# Patient Record
Sex: Male | Born: 1985 | Race: Black or African American | Hispanic: No | Marital: Single | Smoking: Current every day smoker
Health system: Southern US, Community
[De-identification: ages and names within clinical notes are randomized; demographics above are authoritative.]

## PROBLEM LIST (undated history)

## (undated) DIAGNOSIS — B1920 Unspecified viral hepatitis C without hepatic coma: Secondary | ICD-10-CM

## (undated) DIAGNOSIS — K859 Acute pancreatitis without necrosis or infection, unspecified: Secondary | ICD-10-CM

## (undated) DIAGNOSIS — K2970 Gastritis, unspecified, without bleeding: Secondary | ICD-10-CM

---

## 2004-03-18 ENCOUNTER — Emergency Department: Payer: Self-pay | Admitting: Emergency Medicine

## 2007-12-18 ENCOUNTER — Emergency Department: Payer: Self-pay | Admitting: Internal Medicine

## 2008-12-27 ENCOUNTER — Emergency Department: Payer: Self-pay | Admitting: Emergency Medicine

## 2009-02-18 ENCOUNTER — Emergency Department: Payer: Self-pay | Admitting: Emergency Medicine

## 2009-03-11 ENCOUNTER — Emergency Department: Payer: Self-pay | Admitting: Emergency Medicine

## 2009-03-12 ENCOUNTER — Emergency Department: Payer: Self-pay | Admitting: Emergency Medicine

## 2009-03-13 ENCOUNTER — Inpatient Hospital Stay: Payer: Self-pay | Admitting: Internal Medicine

## 2009-03-18 ENCOUNTER — Emergency Department: Payer: Self-pay | Admitting: Emergency Medicine

## 2009-04-23 ENCOUNTER — Inpatient Hospital Stay: Payer: Self-pay | Admitting: Internal Medicine

## 2009-07-03 ENCOUNTER — Emergency Department: Payer: Self-pay | Admitting: Internal Medicine

## 2009-07-03 ENCOUNTER — Emergency Department: Payer: Self-pay | Admitting: Emergency Medicine

## 2009-07-05 ENCOUNTER — Emergency Department: Payer: Self-pay | Admitting: Unknown Physician Specialty

## 2009-07-09 ENCOUNTER — Emergency Department: Payer: Self-pay | Admitting: Unknown Physician Specialty

## 2009-11-27 ENCOUNTER — Emergency Department: Payer: Self-pay | Admitting: Internal Medicine

## 2009-11-29 ENCOUNTER — Emergency Department: Payer: Self-pay | Admitting: Emergency Medicine

## 2010-03-02 ENCOUNTER — Emergency Department: Payer: Self-pay | Admitting: Emergency Medicine

## 2010-03-12 ENCOUNTER — Observation Stay: Payer: Self-pay | Admitting: Internal Medicine

## 2010-03-27 ENCOUNTER — Emergency Department: Payer: Self-pay | Admitting: Unknown Physician Specialty

## 2010-04-03 ENCOUNTER — Observation Stay: Payer: Self-pay | Admitting: Internal Medicine

## 2010-06-25 ENCOUNTER — Observation Stay: Payer: Self-pay | Admitting: Internal Medicine

## 2010-06-26 ENCOUNTER — Emergency Department: Payer: Self-pay | Admitting: Emergency Medicine

## 2010-09-06 ENCOUNTER — Emergency Department: Payer: Self-pay | Admitting: Emergency Medicine

## 2010-10-05 ENCOUNTER — Emergency Department: Payer: Self-pay | Admitting: Emergency Medicine

## 2010-11-26 ENCOUNTER — Emergency Department: Payer: Self-pay | Admitting: *Deleted

## 2010-12-11 ENCOUNTER — Emergency Department: Payer: Self-pay | Admitting: Internal Medicine

## 2011-02-14 ENCOUNTER — Inpatient Hospital Stay: Payer: Self-pay | Admitting: Specialist

## 2011-02-25 ENCOUNTER — Emergency Department: Payer: Self-pay | Admitting: Emergency Medicine

## 2011-02-26 ENCOUNTER — Emergency Department: Payer: Self-pay | Admitting: Internal Medicine

## 2011-03-24 ENCOUNTER — Emergency Department: Payer: Self-pay | Admitting: Emergency Medicine

## 2011-03-25 ENCOUNTER — Inpatient Hospital Stay: Payer: Self-pay | Admitting: Internal Medicine

## 2011-03-29 ENCOUNTER — Emergency Department: Payer: Self-pay | Admitting: Emergency Medicine

## 2011-03-31 ENCOUNTER — Emergency Department: Payer: Self-pay | Admitting: *Deleted

## 2011-05-08 ENCOUNTER — Emergency Department: Payer: Self-pay | Admitting: Emergency Medicine

## 2011-06-02 ENCOUNTER — Emergency Department: Payer: Self-pay | Admitting: Emergency Medicine

## 2011-06-02 LAB — AMYLASE: Amylase: 61 U/L (ref 25–115)

## 2011-06-02 LAB — COMPREHENSIVE METABOLIC PANEL
Anion Gap: 11 (ref 7–16)
BUN: 23 mg/dL — ABNORMAL HIGH (ref 7–18)
Bilirubin,Total: 0.8 mg/dL (ref 0.2–1.0)
Chloride: 102 mmol/L (ref 98–107)
Co2: 26 mmol/L (ref 21–32)
Creatinine: 1.4 mg/dL — ABNORMAL HIGH (ref 0.60–1.30)
EGFR (African American): >60
EGFR (Non-African Amer.): >60
Osmolality: 283 (ref 275–301)
Potassium: 3.8 mmol/L (ref 3.5–5.1)
SGPT (ALT): 37 U/L
Total Protein: 9.3 g/dL — ABNORMAL HIGH (ref 6.4–8.2)

## 2011-06-02 LAB — CBC
HCT: 44.5 % (ref 40.0–52.0)
HGB: 15.2 g/dL (ref 13.0–18.0)
MCHC: 34.2 g/dL (ref 32.0–36.0)
Platelet: 187 10*3/uL (ref 150–440)
RDW: 13.4 % (ref 11.5–14.5)
WBC: 21.8 10*3/uL — ABNORMAL HIGH (ref 3.8–10.6)

## 2011-06-03 ENCOUNTER — Emergency Department: Payer: Self-pay | Admitting: *Deleted

## 2011-06-03 LAB — CBC
HCT: 41.7 % (ref 40.0–52.0)
HGB: 13.8 g/dL (ref 13.0–18.0)
MCH: 31 pg (ref 26.0–34.0)
MCV: 94 fL (ref 80–100)
Platelet: 162 10*3/uL (ref 150–440)
RBC: 4.45 10*6/uL (ref 4.40–5.90)
WBC: 9.8 10*3/uL (ref 3.8–10.6)

## 2011-06-03 LAB — COMPREHENSIVE METABOLIC PANEL
Alkaline Phosphatase: 53 U/L (ref 50–136)
Anion Gap: 9 (ref 7–16)
BUN: 14 mg/dL (ref 7–18)
Calcium, Total: 9.5 mg/dL (ref 8.5–10.1)
Chloride: 105 mmol/L (ref 98–107)
Creatinine: 1.19 mg/dL (ref 0.60–1.30)
EGFR (African American): >60
Osmolality: 282 (ref 275–301)
Potassium: 3.7 mmol/L (ref 3.5–5.1)
SGOT(AST): 22 U/L (ref 15–37)
Total Protein: 7.8 g/dL (ref 6.4–8.2)

## 2011-06-03 LAB — ETHANOL: Ethanol: <3 mg/dL

## 2011-06-05 ENCOUNTER — Emergency Department: Payer: Self-pay | Admitting: Emergency Medicine

## 2011-06-05 LAB — COMPREHENSIVE METABOLIC PANEL
Albumin: 3.8 g/dL (ref 3.4–5.0)
Anion Gap: 12 (ref 7–16)
BUN: 13 mg/dL (ref 7–18)
Bilirubin,Total: 0.2 mg/dL (ref 0.2–1.0)
Chloride: 108 mmol/L — ABNORMAL HIGH (ref 98–107)
Co2: 24 mmol/L (ref 21–32)
Creatinine: 0.95 mg/dL (ref 0.60–1.30)
EGFR (African American): >60
Potassium: 3.6 mmol/L (ref 3.5–5.1)
Sodium: 144 mmol/L (ref 136–145)
Total Protein: 7.7 g/dL (ref 6.4–8.2)

## 2011-06-05 LAB — CBC WITH DIFFERENTIAL/PLATELET
Basophil %: 0.3 %
Eosinophil %: 2.1 %
HCT: 41 % (ref 40.0–52.0)
HGB: 13.4 g/dL (ref 13.0–18.0)
Lymphocyte #: 2 10*3/uL (ref 1.0–3.6)
Lymphocyte %: 13.4 %
MCHC: 32.8 g/dL (ref 32.0–36.0)
MCV: 94 fL (ref 80–100)
Monocyte %: 8.2 %
Neutrophil %: 76 %
RBC: 4.34 10*6/uL — ABNORMAL LOW (ref 4.40–5.90)
WBC: 14.7 10*3/uL — ABNORMAL HIGH (ref 3.8–10.6)

## 2011-07-04 ENCOUNTER — Emergency Department: Payer: Self-pay | Admitting: Emergency Medicine

## 2011-07-04 LAB — URINALYSIS, COMPLETE
Blood: NEGATIVE
Glucose,UR: NEGATIVE mg/dL (ref 0–75)
Ketone: NEGATIVE
Nitrite: NEGATIVE
Ph: 9 (ref 4.5–8.0)
Specific Gravity: 1.02 (ref 1.003–1.030)

## 2011-07-04 LAB — COMPREHENSIVE METABOLIC PANEL
Albumin: 4.7 g/dL (ref 3.4–5.0)
Calcium, Total: 9.4 mg/dL (ref 8.5–10.1)
Co2: 26 mmol/L (ref 21–32)
EGFR (African American): >60
EGFR (Non-African Amer.): >60
Glucose: 109 mg/dL — ABNORMAL HIGH (ref 65–99)
Osmolality: 283 (ref 275–301)
Potassium: 3.8 mmol/L (ref 3.5–5.1)
SGOT(AST): 30 U/L (ref 15–37)
SGPT (ALT): 29 U/L
Sodium: 141 mmol/L (ref 136–145)
Total Protein: 8.9 g/dL — ABNORMAL HIGH (ref 6.4–8.2)

## 2011-07-04 LAB — CBC
MCHC: 33.8 g/dL (ref 32.0–36.0)
MCV: 93 fL (ref 80–100)
RBC: 4.82 10*6/uL (ref 4.40–5.90)
RDW: 13.2 % (ref 11.5–14.5)
WBC: 12.7 10*3/uL — ABNORMAL HIGH (ref 3.8–10.6)

## 2011-07-04 LAB — LIPASE, BLOOD: Lipase: 266 U/L (ref 73–393)

## 2011-07-05 ENCOUNTER — Emergency Department: Payer: Self-pay | Admitting: Emergency Medicine

## 2011-07-05 LAB — COMPREHENSIVE METABOLIC PANEL
Anion Gap: 14 (ref 7–16)
Bilirubin,Total: 0.7 mg/dL (ref 0.2–1.0)
Chloride: 99 mmol/L (ref 98–107)
Co2: 30 mmol/L (ref 21–32)
Creatinine: 1.2 mg/dL (ref 0.60–1.30)
EGFR (African American): >60
EGFR (Non-African Amer.): >60
Glucose: 129 mg/dL — ABNORMAL HIGH (ref 65–99)
Osmolality: 288 (ref 275–301)
Potassium: 4 mmol/L (ref 3.5–5.1)
SGPT (ALT): 28 U/L
Sodium: 143 mmol/L (ref 136–145)
Total Protein: 9.3 g/dL — ABNORMAL HIGH (ref 6.4–8.2)

## 2011-07-05 LAB — LIPASE, BLOOD: Lipase: 52 U/L — ABNORMAL LOW (ref 73–393)

## 2011-07-05 LAB — CBC
MCHC: 33.5 g/dL (ref 32.0–36.0)
MCV: 94 fL (ref 80–100)
Platelet: 189 10*3/uL (ref 150–440)
RBC: 5.04 10*6/uL (ref 4.40–5.90)

## 2011-07-06 ENCOUNTER — Emergency Department: Payer: Self-pay | Admitting: Emergency Medicine

## 2011-07-06 ENCOUNTER — Inpatient Hospital Stay: Payer: Self-pay | Admitting: Internal Medicine

## 2011-07-06 LAB — COMPREHENSIVE METABOLIC PANEL
Albumin: 4 g/dL (ref 3.4–5.0)
Alkaline Phosphatase: 60 U/L (ref 50–136)
Bilirubin,Total: 0.3 mg/dL (ref 0.2–1.0)
Calcium, Total: 9 mg/dL (ref 8.5–10.1)
EGFR (African American): >60
EGFR (Non-African Amer.): >60
Glucose: 110 mg/dL — ABNORMAL HIGH (ref 65–99)
Potassium: 3.5 mmol/L (ref 3.5–5.1)
Sodium: 139 mmol/L (ref 136–145)

## 2011-07-06 LAB — CBC
HCT: 42.3 % (ref 40.0–52.0)
HGB: 14.1 g/dL (ref 13.0–18.0)
MCHC: 33.3 g/dL (ref 32.0–36.0)
Platelet: 162 10*3/uL (ref 150–440)
RBC: 4.5 10*6/uL (ref 4.40–5.90)
WBC: 12.7 10*3/uL — ABNORMAL HIGH (ref 3.8–10.6)

## 2011-07-06 LAB — URINALYSIS, COMPLETE
Bacteria: NONE SEEN
Glucose,UR: NEGATIVE mg/dL (ref 0–75)
Leukocyte Esterase: NEGATIVE
Nitrite: NEGATIVE
Protein: NEGATIVE
RBC,UR: 2 /HPF (ref 0–5)
Squamous Epithelial: 1
WBC UR: 2 /HPF (ref 0–5)

## 2011-07-06 LAB — LIPASE, BLOOD: Lipase: 874 U/L — ABNORMAL HIGH (ref 73–393)

## 2011-07-07 LAB — LIPASE, BLOOD: Lipase: 207 U/L (ref 73–393)

## 2011-07-07 LAB — CBC WITH DIFFERENTIAL/PLATELET
Basophil %: 0.5 %
Eosinophil #: 0.4 10*3/uL (ref 0.0–0.7)
Eosinophil %: 3.3 %
HGB: 11.8 g/dL — ABNORMAL LOW (ref 13.0–18.0)
Lymphocyte #: 4.1 10*3/uL — ABNORMAL HIGH (ref 1.0–3.6)
Lymphocyte %: 35.8 %
MCH: 31.2 pg (ref 26.0–34.0)
MCV: 95 fL (ref 80–100)
Monocyte #: 1.1 10*3/uL — ABNORMAL HIGH (ref 0.0–0.7)
Neutrophil %: 51.2 %
Platelet: 135 10*3/uL — ABNORMAL LOW (ref 150–440)
WBC: 11.5 10*3/uL — ABNORMAL HIGH (ref 3.8–10.6)

## 2011-07-07 LAB — COMPREHENSIVE METABOLIC PANEL
BUN: 10 mg/dL (ref 7–18)
Bilirubin,Total: 0.5 mg/dL (ref 0.2–1.0)
Calcium, Total: 8.5 mg/dL (ref 8.5–10.1)
Chloride: 104 mmol/L (ref 98–107)
Co2: 28 mmol/L (ref 21–32)
Creatinine: 1.11 mg/dL (ref 0.60–1.30)
EGFR (African American): >60
EGFR (Non-African Amer.): >60
Glucose: 87 mg/dL (ref 65–99)
Osmolality: 280 (ref 275–301)
Potassium: 3.7 mmol/L (ref 3.5–5.1)
SGOT(AST): 22 U/L (ref 15–37)
SGPT (ALT): 16 U/L
Sodium: 141 mmol/L (ref 136–145)
Total Protein: 6 g/dL — ABNORMAL LOW (ref 6.4–8.2)

## 2011-07-29 ENCOUNTER — Emergency Department: Payer: Self-pay | Admitting: Emergency Medicine

## 2011-07-29 LAB — URINALYSIS, COMPLETE
Bilirubin,UR: NEGATIVE
Leukocyte Esterase: NEGATIVE
Nitrite: NEGATIVE
Ph: 8 (ref 4.5–8.0)
RBC,UR: 3 /HPF (ref 0–5)
Squamous Epithelial: <1
WBC UR: 1 /HPF (ref 0–5)

## 2011-07-29 LAB — COMPREHENSIVE METABOLIC PANEL
Albumin: 4.4 g/dL (ref 3.4–5.0)
Anion Gap: 10 (ref 7–16)
Bilirubin,Total: 0.4 mg/dL (ref 0.2–1.0)
Calcium, Total: 9.5 mg/dL (ref 8.5–10.1)
Co2: 24 mmol/L (ref 21–32)
Creatinine: 0.98 mg/dL (ref 0.60–1.30)
EGFR (Non-African Amer.): >60
Glucose: 112 mg/dL — ABNORMAL HIGH (ref 65–99)
Osmolality: 284 (ref 275–301)
Potassium: 4 mmol/L (ref 3.5–5.1)
SGOT(AST): 26 U/L (ref 15–37)

## 2011-07-29 LAB — CBC
HGB: 14.9 g/dL (ref 13.0–18.0)
MCHC: 33.5 g/dL (ref 32.0–36.0)
Platelet: 204 10*3/uL (ref 150–440)
RDW: 13.6 % (ref 11.5–14.5)
WBC: 18 10*3/uL — ABNORMAL HIGH (ref 3.8–10.6)

## 2011-07-29 LAB — ETHANOL: Ethanol: <3 mg/dL

## 2011-07-31 ENCOUNTER — Emergency Department: Payer: Self-pay | Admitting: Emergency Medicine

## 2011-07-31 LAB — URINALYSIS, COMPLETE
Bacteria: NONE SEEN
Bilirubin,UR: NEGATIVE
Blood: NEGATIVE
Glucose,UR: NEGATIVE mg/dL (ref 0–75)
Ketone: NEGATIVE
Leukocyte Esterase: NEGATIVE
Nitrite: NEGATIVE
Ph: 6 (ref 4.5–8.0)
Protein: NEGATIVE
RBC,UR: 1 /HPF (ref 0–5)
Specific Gravity: 1.008 (ref 1.003–1.030)
Squamous Epithelial: NONE SEEN
WBC UR: NONE SEEN /HPF (ref 0–5)

## 2011-07-31 LAB — COMPREHENSIVE METABOLIC PANEL
Alkaline Phosphatase: 53 U/L (ref 50–136)
Anion Gap: 6 — ABNORMAL LOW (ref 7–16)
Bilirubin,Total: 0.4 mg/dL (ref 0.2–1.0)
Calcium, Total: 9 mg/dL (ref 8.5–10.1)
Co2: 27 mmol/L (ref 21–32)
EGFR (African American): >60
Glucose: 94 mg/dL (ref 65–99)
Osmolality: 280 (ref 275–301)
Potassium: 3.9 mmol/L (ref 3.5–5.1)
SGOT(AST): 19 U/L (ref 15–37)
Total Protein: 7.5 g/dL (ref 6.4–8.2)

## 2011-07-31 LAB — CBC
HCT: 41.5 % (ref 40.0–52.0)
HGB: 13.8 g/dL (ref 13.0–18.0)
MCH: 31.4 pg (ref 26.0–34.0)
MCHC: 33.2 g/dL (ref 32.0–36.0)
MCV: 95 fL (ref 80–100)
Platelet: 173 10*3/uL (ref 150–440)
RBC: 4.38 10*6/uL — ABNORMAL LOW (ref 4.40–5.90)
RDW: 13.4 % (ref 11.5–14.5)
WBC: 9.3 10*3/uL (ref 3.8–10.6)

## 2011-09-07 ENCOUNTER — Emergency Department: Payer: Self-pay | Admitting: Emergency Medicine

## 2011-09-07 LAB — COMPREHENSIVE METABOLIC PANEL
Albumin: 4.4 g/dL (ref 3.4–5.0)
Alkaline Phosphatase: 73 U/L (ref 50–136)
BUN: 10 mg/dL (ref 7–18)
Bilirubin,Total: 0.7 mg/dL (ref 0.2–1.0)
Chloride: 103 mmol/L (ref 98–107)
Co2: 26 mmol/L (ref 21–32)
Creatinine: 1.04 mg/dL (ref 0.60–1.30)
EGFR (African American): >60
Osmolality: 275 (ref 275–301)
Potassium: 3.7 mmol/L (ref 3.5–5.1)
SGOT(AST): 30 U/L (ref 15–37)
SGPT (ALT): 24 U/L
Sodium: 138 mmol/L (ref 136–145)
Total Protein: 8.5 g/dL — ABNORMAL HIGH (ref 6.4–8.2)

## 2011-09-07 LAB — URINALYSIS, COMPLETE
Bacteria: NONE SEEN
Bilirubin,UR: NEGATIVE
Glucose,UR: NEGATIVE mg/dL (ref 0–75)
Nitrite: NEGATIVE
Ph: 6 (ref 4.5–8.0)
RBC,UR: 2 /HPF (ref 0–5)
Specific Gravity: 1.023 (ref 1.003–1.030)

## 2011-09-07 LAB — CBC WITH DIFFERENTIAL/PLATELET
Basophil #: 0.1 10*3/uL (ref 0.0–0.1)
Eosinophil %: 0.9 %
HGB: 14.7 g/dL (ref 13.0–18.0)
Lymphocyte #: 2.2 10*3/uL (ref 1.0–3.6)
Lymphocyte %: 21.3 %
MCHC: 33 g/dL (ref 32.0–36.0)
MCV: 95 fL (ref 80–100)
Monocyte #: 0.8 x10 3/mm (ref 0.2–1.0)
Monocyte %: 7.8 %
Neutrophil #: 7.2 10*3/uL — ABNORMAL HIGH (ref 1.4–6.5)
Neutrophil %: 69.1 %
Platelet: 175 10*3/uL (ref 150–440)
RBC: 4.68 10*6/uL (ref 4.40–5.90)
RDW: 13.6 % (ref 11.5–14.5)

## 2011-09-07 LAB — LIPASE, BLOOD: Lipase: 112 U/L (ref 73–393)

## 2011-09-08 ENCOUNTER — Emergency Department: Payer: Self-pay | Admitting: *Deleted

## 2011-10-09 ENCOUNTER — Emergency Department: Payer: Self-pay | Admitting: Emergency Medicine

## 2011-10-09 LAB — CBC
MCHC: 33.3 g/dL (ref 32.0–36.0)
Platelet: 155 10*3/uL (ref 150–440)
RBC: 4.33 10*6/uL — ABNORMAL LOW (ref 4.40–5.90)
RDW: 13.3 % (ref 11.5–14.5)
WBC: 11.1 10*3/uL — ABNORMAL HIGH (ref 3.8–10.6)

## 2011-10-09 LAB — COMPREHENSIVE METABOLIC PANEL
Alkaline Phosphatase: 58 U/L (ref 50–136)
Calcium, Total: 9.2 mg/dL (ref 8.5–10.1)
Chloride: 106 mmol/L (ref 98–107)
Co2: 25 mmol/L (ref 21–32)
Creatinine: 1.03 mg/dL (ref 0.60–1.30)
EGFR (African American): >60
Glucose: 100 mg/dL — ABNORMAL HIGH (ref 65–99)
Osmolality: 280 (ref 275–301)
Potassium: 3.4 mmol/L — ABNORMAL LOW (ref 3.5–5.1)
Sodium: 141 mmol/L (ref 136–145)

## 2011-10-09 LAB — MAGNESIUM: Magnesium: 1.6 mg/dL — ABNORMAL LOW

## 2011-10-09 LAB — LIPASE, BLOOD: Lipase: 55 U/L — ABNORMAL LOW (ref 73–393)

## 2011-12-06 ENCOUNTER — Emergency Department: Payer: Self-pay | Admitting: Emergency Medicine

## 2011-12-06 LAB — COMPREHENSIVE METABOLIC PANEL
Alkaline Phosphatase: 74 U/L (ref 50–136)
BUN: 13 mg/dL (ref 7–18)
Bilirubin,Total: 0.6 mg/dL (ref 0.2–1.0)
Co2: 25 mmol/L (ref 21–32)
Creatinine: 1.14 mg/dL (ref 0.60–1.30)
EGFR (African American): >60
EGFR (Non-African Amer.): >60
SGPT (ALT): 24 U/L (ref 12–78)
Sodium: 138 mmol/L (ref 136–145)
Total Protein: 8.6 g/dL — ABNORMAL HIGH (ref 6.4–8.2)

## 2011-12-06 LAB — CBC
HCT: 42.2 % (ref 40.0–52.0)
HGB: 14.3 g/dL (ref 13.0–18.0)
MCH: 31.3 pg (ref 26.0–34.0)
MCV: 92 fL (ref 80–100)
Platelet: 194 10*3/uL (ref 150–440)
RDW: 13.1 % (ref 11.5–14.5)
WBC: 17 10*3/uL — ABNORMAL HIGH (ref 3.8–10.6)

## 2011-12-06 LAB — URINALYSIS, COMPLETE
Bacteria: NONE SEEN
Glucose,UR: NEGATIVE mg/dL (ref 0–75)
Ph: 6 (ref 4.5–8.0)
RBC,UR: 2 /HPF (ref 0–5)
Specific Gravity: 1.012 (ref 1.003–1.030)
Squamous Epithelial: <1
WBC UR: 2 /HPF (ref 0–5)

## 2011-12-06 LAB — LIPASE, BLOOD: Lipase: 61 U/L — ABNORMAL LOW (ref 73–393)

## 2011-12-31 ENCOUNTER — Emergency Department: Payer: Self-pay | Admitting: Emergency Medicine

## 2011-12-31 LAB — COMPREHENSIVE METABOLIC PANEL
Anion Gap: 13 (ref 7–16)
BUN: 17 mg/dL (ref 7–18)
Bilirubin,Total: 0.7 mg/dL (ref 0.2–1.0)
Calcium, Total: 9.7 mg/dL (ref 8.5–10.1)
Chloride: 102 mmol/L (ref 98–107)
Co2: 23 mmol/L (ref 21–32)
Creatinine: 1.05 mg/dL (ref 0.60–1.30)
EGFR (African American): >60
EGFR (Non-African Amer.): >60
Glucose: 108 mg/dL — ABNORMAL HIGH (ref 65–99)
Potassium: 3.9 mmol/L (ref 3.5–5.1)
SGOT(AST): 44 U/L — ABNORMAL HIGH (ref 15–37)
SGPT (ALT): 78 U/L (ref 12–78)
Sodium: 138 mmol/L (ref 136–145)
Total Protein: 8.8 g/dL — ABNORMAL HIGH (ref 6.4–8.2)

## 2011-12-31 LAB — CBC
HGB: 13.1 g/dL (ref 13.0–18.0)
MCH: 30 pg (ref 26.0–34.0)
MCHC: 33 g/dL (ref 32.0–36.0)
RBC: 4.37 10*6/uL — ABNORMAL LOW (ref 4.40–5.90)
RDW: 13.2 % (ref 11.5–14.5)

## 2012-05-11 ENCOUNTER — Encounter (HOSPITAL_COMMUNITY): Payer: Self-pay

## 2012-05-11 ENCOUNTER — Emergency Department (HOSPITAL_COMMUNITY): Payer: Self-pay

## 2012-05-11 ENCOUNTER — Emergency Department (HOSPITAL_COMMUNITY)
Admission: EM | Admit: 2012-05-11 | Discharge: 2012-05-11 | Disposition: A | Discharge status: Home / Self Care | Payer: Self-pay | Attending: Emergency Medicine | Admitting: Emergency Medicine

## 2012-05-11 ENCOUNTER — Emergency Department: Payer: Self-pay | Admitting: Emergency Medicine

## 2012-05-11 DIAGNOSIS — Z8719 Personal history of other diseases of the digestive system: Secondary | ICD-10-CM | POA: Insufficient documentation

## 2012-05-11 DIAGNOSIS — Z8619 Personal history of other infectious and parasitic diseases: Secondary | ICD-10-CM | POA: Insufficient documentation

## 2012-05-11 DIAGNOSIS — R1013 Epigastric pain: Secondary | ICD-10-CM | POA: Insufficient documentation

## 2012-05-11 DIAGNOSIS — R109 Unspecified abdominal pain: Secondary | ICD-10-CM

## 2012-05-11 HISTORY — DX: Acute pancreatitis without necrosis or infection, unspecified: K85.90

## 2012-05-11 HISTORY — DX: Unspecified viral hepatitis C without hepatic coma: B19.20

## 2012-05-11 LAB — LIPASE, BLOOD: Lipase: 9 U/L — ABNORMAL LOW (ref 11–59)

## 2012-05-11 LAB — CBC WITH DIFFERENTIAL/PLATELET
Eosinophils Absolute: 0.1 10*3/uL (ref 0.0–0.7)
Eosinophils Relative: 0 % (ref 0–5)
HCT: 42.1 % (ref 39.0–52.0)
Lymphs Abs: 0.6 10*3/uL — ABNORMAL LOW (ref 0.7–4.0)
MCH: 31.6 pg (ref 26.0–34.0)
MCV: 89.2 fL (ref 78.0–100.0)
Monocytes Absolute: 0.7 10*3/uL (ref 0.1–1.0)
Monocytes Relative: 4 % (ref 3–12)
Platelets: 217 10*3/uL (ref 150–400)
RBC: 4.72 MIL/uL (ref 4.22–5.81)

## 2012-05-11 LAB — COMPREHENSIVE METABOLIC PANEL
BUN: 12 mg/dL (ref 6–23)
Calcium: 9.6 mg/dL (ref 8.4–10.5)
Creatinine, Ser: 0.87 mg/dL (ref 0.50–1.35)
GFR calc Af Amer: >90 mL/min (ref >90)
Glucose, Bld: 143 mg/dL — ABNORMAL HIGH (ref 70–99)
Sodium: 142 mEq/L (ref 135–145)
Total Protein: 7.8 g/dL (ref 6.0–8.3)

## 2012-05-11 LAB — RAPID URINE DRUG SCREEN, HOSP PERFORMED
Cocaine: NOT DETECTED
Opiates: NOT DETECTED

## 2012-05-11 LAB — ETHANOL: Alcohol, Ethyl (B): <11 mg/dL (ref 0–11)

## 2012-05-11 MED ORDER — HYDROMORPHONE HCL PF 1 MG/ML IJ SOLN
2.0000 mg | Freq: Once | INTRAMUSCULAR | Status: AC
  Administered 2012-05-11: 2 mg via INTRAVENOUS
  Filled 2012-05-11: qty 2

## 2012-05-11 MED ORDER — IOHEXOL 300 MG/ML  SOLN
100.0000 mL | Freq: Once | INTRAMUSCULAR | Status: AC | PRN
  Administered 2012-05-11: 100 mL via INTRAVENOUS

## 2012-05-11 MED ORDER — IOHEXOL 300 MG/ML  SOLN
50.0000 mL | Freq: Once | INTRAMUSCULAR | Status: AC | PRN
  Administered 2012-05-11: 50 mL via ORAL

## 2012-05-11 MED ORDER — HYDROMORPHONE HCL PF 1 MG/ML IJ SOLN
1.0000 mg | Freq: Once | INTRAMUSCULAR | Status: AC
  Administered 2012-05-11: 1 mg via INTRAVENOUS
  Filled 2012-05-11: qty 1

## 2012-05-11 MED ORDER — HYDROCODONE-ACETAMINOPHEN 5-325 MG PO TABS: 1.0000 | ORAL_TABLET | ORAL | Status: DC | PRN

## 2012-05-11 MED ORDER — PROMETHAZINE HCL 25 MG PO TABS: 25.0000 mg | ORAL_TABLET | Freq: Four times a day (QID) | ORAL | Status: DC | PRN

## 2012-05-11 MED ORDER — SODIUM CHLORIDE 0.9 % IV SOLN
INTRAVENOUS | Status: DC
  Administered 2012-05-11: 16:00:00 via INTRAVENOUS

## 2012-05-11 MED ORDER — LORAZEPAM 2 MG/ML IJ SOLN
1.0000 mg | Freq: Once | INTRAMUSCULAR | Status: AC
  Administered 2012-05-11: 1 mg via INTRAVENOUS
  Filled 2012-05-11: qty 1

## 2012-05-11 MED ORDER — SODIUM CHLORIDE 0.9 % IV BOLUS (SEPSIS)
1000.0000 mL | Freq: Once | INTRAVENOUS | Status: AC
  Administered 2012-05-11: 1000 mL via INTRAVENOUS

## 2012-05-11 MED ORDER — PROMETHAZINE HCL 25 MG/ML IJ SOLN
25.0000 mg | Freq: Once | INTRAMUSCULAR | Status: AC
  Administered 2012-05-11: 25 mg via INTRAVENOUS
  Filled 2012-05-11: qty 1

## 2012-05-11 MED ORDER — PANTOPRAZOLE SODIUM 40 MG IV SOLR
40.0000 mg | Freq: Once | INTRAVENOUS | Status: AC
  Administered 2012-05-11: 40 mg via INTRAVENOUS
  Filled 2012-05-11: qty 40

## 2012-05-11 MED ORDER — ONDANSETRON HCL 4 MG/2ML IJ SOLN
4.0000 mg | Freq: Once | INTRAMUSCULAR | Status: DC
  Filled 2012-05-11: qty 2

## 2012-05-11 NOTE — ED Provider Notes (Addendum)
Pt is resting comfortably. No vomiting in ED. States his pain is improved. Informed of lab and CT abnormalities. Will f/u with GI 05/17/12. Pt requesting shot of pain meds before he goes though he currently denies pain. Advised to return for worsening symptoms. Tolerating PO's. Pt became hostile and required security escort to leave.   Loren Racer, MD 05/11/12 1844  Loren Racer, MD 05/11/12 Bennie Hind  Loren Racer, MD 05/11/12 Mikle Bosworth

## 2012-05-11 NOTE — ED Notes (Signed)
Pt given zofran by ems. 

## 2012-05-11 NOTE — ED Notes (Signed)
Patient transported to CT 

## 2012-05-11 NOTE — ED Notes (Signed)
Pt finished drinking contrast, ct called.  

## 2012-05-11 NOTE — ED Provider Notes (Signed)
History     CSN: [id number]  Arrival date & time 05/11/12  1325   First MD Initiated Contact with Patient 05/11/12 1327      Chief Complaint  Patient presents with  . Pancreatitis    (Consider location/radiation/quality/duration/timing/severity/associated sxs/prior treatment) The history is provided by the patient.   patient here with abdominal pain that began this morning so it is prior pancreatitis. Denies any alcohol use. Pain is in his mid epigastric area radiating to his back. His emesis has been nonbilious. He denies any black or bloody stools. No medications taken by the patient prior to arrival. He called EMS and was given Zofran. He is requesting protonic, dialaudid, Phenergan. He is scheduled to see a GI doctor in [facility] [address]  Past Medical History  Diagnosis Date  . Pancreatitis   . Hepatitis C     No past surgical history on file.  No family history on file.  History  Substance Use Topics  . Smoking status: Not on file  . Smokeless tobacco: Not on file  . Alcohol Use: Not on file      Review of Systems  All other systems reviewed and are negative.    Allergies  Review of patient's allergies indicates not on file.  Home Medications  No current outpatient prescriptions on file.  BP 150/118  Pulse 89  Temp(Src) 97.3 F (36.3 C) (Oral)  Resp 24  SpO2 100%  Physical Exam  Nursing note and vitals reviewed. Constitutional: He is oriented to person, place, and time. He appears well-developed and well-nourished.  Non-toxic appearance. No distress.  HENT:  Head: Normocephalic and atraumatic.  Eyes: Conjunctivae, EOM and lids are normal. Pupils are equal, round, and reactive to light.  Neck: Normal range of motion. Neck supple. No tracheal deviation present. No mass present.  Cardiovascular: Normal rate, regular rhythm and normal heart sounds.  Exam reveals no gallop.   No murmur heard. Pulmonary/Chest: Effort normal and breath sounds normal. No  stridor. No respiratory distress. He has no decreased breath sounds. He has no wheezes. He has no rhonchi. He has no rales.  Abdominal: Soft. Normal appearance and bowel sounds are normal. He exhibits no distension. There is tenderness in the epigastric area. There is no rigidity, no rebound, no guarding and no CVA tenderness.    Musculoskeletal: Normal range of motion. He exhibits no edema and no tenderness.  Neurological: He is alert and oriented to person, place, and time. He has normal strength. No cranial nerve deficit or sensory deficit. GCS eye subscore is 4. GCS verbal subscore is 5. GCS motor subscore is 6.  Skin: Skin is warm and dry. No abrasion and no rash noted.  Psychiatric: He has a normal mood and affect. His speech is normal and behavior is normal.    ED Course  Procedures (including critical care time)  Labs Reviewed  CBC WITH DIFFERENTIAL  COMPREHENSIVE METABOLIC PANEL  LIPASE, BLOOD   No results found.   No diagnosis found.    MDM  Patient given IV fluids and pain medications. Acute abdominal series with possible SBO. Abdominal CT is pending at this time        Toy Baker, MD 05/11/12 1434

## 2012-05-11 NOTE — ED Notes (Signed)
Pt here for abd pain by ems, hx of pancreatitis, pt requesting pain medications on arrival and from EMS enroute. Seen at [facility] for same last week.

## 2012-05-11 NOTE — ED Notes (Signed)
Patient transported to X-ray 

## 2012-06-10 ENCOUNTER — Inpatient Hospital Stay (HOSPITAL_COMMUNITY)
Admission: EM | Admit: 2012-06-10 | Discharge: 2012-06-14 | DRG: 948 | Disposition: A | Discharge status: Home / Self Care | Payer: MEDICAID | Attending: Internal Medicine | Admitting: Internal Medicine

## 2012-06-10 ENCOUNTER — Encounter (HOSPITAL_COMMUNITY): Payer: Self-pay | Admitting: *Deleted

## 2012-06-10 DIAGNOSIS — F17200 Nicotine dependence, unspecified, uncomplicated: Secondary | ICD-10-CM | POA: Diagnosis present

## 2012-06-10 DIAGNOSIS — R7401 Elevation of levels of liver transaminase levels: Principal | ICD-10-CM | POA: Diagnosis present

## 2012-06-10 DIAGNOSIS — D1803 Hemangioma of intra-abdominal structures: Secondary | ICD-10-CM | POA: Diagnosis present

## 2012-06-10 DIAGNOSIS — R1115 Cyclical vomiting syndrome unrelated to migraine: Secondary | ICD-10-CM | POA: Diagnosis present

## 2012-06-10 DIAGNOSIS — D72829 Elevated white blood cell count, unspecified: Secondary | ICD-10-CM | POA: Diagnosis present

## 2012-06-10 DIAGNOSIS — R7402 Elevation of levels of lactic acid dehydrogenase (LDH): Principal | ICD-10-CM | POA: Diagnosis present

## 2012-06-10 DIAGNOSIS — Z720 Tobacco use: Secondary | ICD-10-CM

## 2012-06-10 DIAGNOSIS — K2970 Gastritis, unspecified, without bleeding: Secondary | ICD-10-CM | POA: Diagnosis present

## 2012-06-10 DIAGNOSIS — B1920 Unspecified viral hepatitis C without hepatic coma: Secondary | ICD-10-CM | POA: Diagnosis present

## 2012-06-10 DIAGNOSIS — R7989 Other specified abnormal findings of blood chemistry: Secondary | ICD-10-CM | POA: Diagnosis present

## 2012-06-10 DIAGNOSIS — F1210 Cannabis abuse, uncomplicated: Secondary | ICD-10-CM | POA: Diagnosis present

## 2012-06-10 DIAGNOSIS — K861 Other chronic pancreatitis: Secondary | ICD-10-CM | POA: Diagnosis present

## 2012-06-10 DIAGNOSIS — R109 Unspecified abdominal pain: Secondary | ICD-10-CM

## 2012-06-10 DIAGNOSIS — Z8719 Personal history of other diseases of the digestive system: Secondary | ICD-10-CM

## 2012-06-10 NOTE — ED Notes (Signed)
Pt approached desk numerous times. Pt is obviously in pain but was reassured that he would be taken back soon but would have to be seen in order of acuity. Pt conscious, alert and oriented x4.

## 2012-06-11 ENCOUNTER — Inpatient Hospital Stay (HOSPITAL_COMMUNITY): Payer: Self-pay

## 2012-06-11 ENCOUNTER — Encounter (HOSPITAL_COMMUNITY): Payer: Self-pay | Admitting: Internal Medicine

## 2012-06-11 DIAGNOSIS — K2970 Gastritis, unspecified, without bleeding: Secondary | ICD-10-CM | POA: Diagnosis present

## 2012-06-11 DIAGNOSIS — F1210 Cannabis abuse, uncomplicated: Secondary | ICD-10-CM

## 2012-06-11 DIAGNOSIS — R109 Unspecified abdominal pain: Secondary | ICD-10-CM | POA: Diagnosis present

## 2012-06-11 DIAGNOSIS — Z720 Tobacco use: Secondary | ICD-10-CM | POA: Diagnosis present

## 2012-06-11 DIAGNOSIS — D72829 Elevated white blood cell count, unspecified: Secondary | ICD-10-CM

## 2012-06-11 DIAGNOSIS — Z8719 Personal history of other diseases of the digestive system: Secondary | ICD-10-CM

## 2012-06-11 LAB — COMPREHENSIVE METABOLIC PANEL
Albumin: 4.7 g/dL (ref 3.5–5.2)
Alkaline Phosphatase: 267 U/L — ABNORMAL HIGH (ref 39–117)
BUN: 17 mg/dL (ref 6–23)
Chloride: 99 mEq/L (ref 96–112)
Creatinine, Ser: 0.81 mg/dL (ref 0.50–1.35)
GFR calc Af Amer: >90 mL/min (ref >90)
Glucose, Bld: 141 mg/dL — ABNORMAL HIGH (ref 70–99)
Total Bilirubin: 2.1 mg/dL — ABNORMAL HIGH (ref 0.3–1.2)

## 2012-06-11 LAB — URINALYSIS, MICROSCOPIC ONLY
Glucose, UA: NEGATIVE mg/dL
Ketones, ur: NEGATIVE mg/dL
Leukocytes, UA: NEGATIVE
Nitrite: NEGATIVE
Protein, ur: NEGATIVE mg/dL
pH: 6 (ref 5.0–8.0)

## 2012-06-11 LAB — CBC WITH DIFFERENTIAL/PLATELET
Basophils Absolute: 0 10*3/uL (ref 0.0–0.1)
Basophils Relative: 0 % (ref 0–1)
Eosinophils Absolute: 0 10*3/uL (ref 0.0–0.7)
HCT: 44.3 % (ref 39.0–52.0)
Hemoglobin: 15.5 g/dL (ref 13.0–17.0)
Lymphocytes Relative: 15 % (ref 12–46)
Lymphs Abs: 1.7 10*3/uL (ref 0.7–4.0)
Lymphs Abs: 2.1 10*3/uL (ref 0.7–4.0)
MCH: 31.3 pg (ref 26.0–34.0)
MCHC: 35 g/dL (ref 30.0–36.0)
MCV: 90.2 fL (ref 78.0–100.0)
Monocytes Absolute: 0.7 10*3/uL (ref 0.1–1.0)
Monocytes Relative: 4 % (ref 3–12)
Neutro Abs: 10.7 10*3/uL — ABNORMAL HIGH (ref 1.7–7.7)
Neutrophils Relative %: 77 % (ref 43–77)
Platelets: 188 10*3/uL (ref 150–400)
RBC: 4.96 MIL/uL (ref 4.22–5.81)
RBC: 4.18 MIL/uL — ABNORMAL LOW (ref 4.22–5.81)
RDW: 14.1 % (ref 11.5–15.5)
WBC: 13.8 10*3/uL — ABNORMAL HIGH (ref 4.0–10.5)

## 2012-06-11 LAB — HIV ANTIBODY (ROUTINE TESTING W REFLEX): HIV: NONREACTIVE

## 2012-06-11 LAB — BASIC METABOLIC PANEL
CO2: 23 mEq/L (ref 19–32)
Calcium: 8.9 mg/dL (ref 8.4–10.5)
GFR calc Af Amer: >90 mL/min (ref >90)
Sodium: 136 mEq/L (ref 135–145)

## 2012-06-11 LAB — RAPID URINE DRUG SCREEN, HOSP PERFORMED
Benzodiazepines: NOT DETECTED
Cocaine: NOT DETECTED

## 2012-06-11 LAB — LIPID PANEL
LDL Cholesterol: 89 mg/dL (ref 0–99)
VLDL: 17 mg/dL (ref 0–40)

## 2012-06-11 LAB — LIPASE, BLOOD: Lipase: 14 U/L (ref 11–59)

## 2012-06-11 LAB — PROTIME-INR
INR: 0.98 (ref 0.00–1.49)
Prothrombin Time: 12.9 seconds (ref 11.6–15.2)

## 2012-06-11 LAB — HEMOGLOBIN A1C: Hgb A1c MFr Bld: 5.3 % (ref <5.7)

## 2012-06-11 LAB — ANTI-SMOOTH MUSCLE ANTIBODY, IGG: F-Actin IgG: 6 U (ref <20)

## 2012-06-11 LAB — HEPATIC FUNCTION PANEL
AST: 393 U/L — ABNORMAL HIGH (ref 0–37)
Albumin: 3.5 g/dL (ref 3.5–5.2)
Bilirubin, Direct: 0.8 mg/dL — ABNORMAL HIGH (ref 0.0–0.3)

## 2012-06-11 LAB — GLUCOSE, CAPILLARY
Glucose-Capillary: 91 mg/dL (ref 70–99)
Glucose-Capillary: 94 mg/dL (ref 70–99)
Glucose-Capillary: 85 mg/dL (ref 70–99)

## 2012-06-11 LAB — SEDIMENTATION RATE: Sed Rate: 3 mm/hr (ref 0–16)

## 2012-06-11 LAB — TROPONIN I: Troponin I: <0.3 ng/mL (ref <0.30)

## 2012-06-11 MED ORDER — HYDROMORPHONE HCL PF 1 MG/ML IJ SOLN
INTRAMUSCULAR | Status: AC
  Filled 2012-06-11: qty 1

## 2012-06-11 MED ORDER — HYDROMORPHONE HCL PF 1 MG/ML IJ SOLN
1.0000 mg | Freq: Once | INTRAMUSCULAR | Status: AC
  Administered 2012-06-11: 1 mg via INTRAVENOUS
  Filled 2012-06-11: qty 1

## 2012-06-11 MED ORDER — PANTOPRAZOLE SODIUM 40 MG IV SOLR
40.0000 mg | Freq: Once | INTRAVENOUS | Status: AC
  Administered 2012-06-11: 40 mg via INTRAVENOUS
  Filled 2012-06-11: qty 40

## 2012-06-11 MED ORDER — ONDANSETRON HCL 4 MG/2ML IJ SOLN: 4.0000 mg | Freq: Four times a day (QID) | INTRAMUSCULAR | Status: DC | PRN

## 2012-06-11 MED ORDER — PROMETHAZINE HCL 25 MG/ML IJ SOLN
12.5000 mg | Freq: Once | INTRAMUSCULAR | Status: AC
  Administered 2012-06-11: 12.5 mg via INTRAVENOUS
  Filled 2012-06-11: qty 1

## 2012-06-11 MED ORDER — ONDANSETRON HCL 4 MG PO TABS: 4.0000 mg | ORAL_TABLET | Freq: Four times a day (QID) | ORAL | Status: DC | PRN

## 2012-06-11 MED ORDER — HYDROMORPHONE HCL PF 2 MG/ML IJ SOLN
2.0000 mg | Freq: Once | INTRAMUSCULAR | Status: AC
  Administered 2012-06-11: 2 mg via INTRAVENOUS
  Filled 2012-06-11: qty 1

## 2012-06-11 MED ORDER — HYDROMORPHONE HCL PF 2 MG/ML IJ SOLN
2.0000 mg | INTRAMUSCULAR | Status: DC | PRN
  Administered 2012-06-11 – 2012-06-13 (×16): 2 mg via INTRAVENOUS
  Filled 2012-06-11 (×16): qty 1

## 2012-06-11 MED ORDER — SODIUM CHLORIDE 0.9 % IV BOLUS (SEPSIS)
1000.0000 mL | Freq: Once | INTRAVENOUS | Status: AC
  Administered 2012-06-11: 1000 mL via INTRAVENOUS

## 2012-06-11 MED ORDER — HYDROMORPHONE HCL PF 1 MG/ML IJ SOLN
1.0000 mg | INTRAMUSCULAR | Status: DC | PRN
  Administered 2012-06-11: 1 mg via INTRAVENOUS
  Filled 2012-06-11: qty 1

## 2012-06-11 MED ORDER — ACETAMINOPHEN 650 MG RE SUPP: 650.0000 mg | Freq: Four times a day (QID) | RECTAL | Status: DC | PRN

## 2012-06-11 MED ORDER — ACETAMINOPHEN 325 MG PO TABS: 650.0000 mg | ORAL_TABLET | Freq: Four times a day (QID) | ORAL | Status: DC | PRN

## 2012-06-11 MED ORDER — PROMETHAZINE HCL 25 MG/ML IJ SOLN: 12.5000 mg | Freq: Once | INTRAMUSCULAR | Status: DC

## 2012-06-11 MED ORDER — FENTANYL CITRATE 0.05 MG/ML IJ SOLN
50.0000 ug | Freq: Once | INTRAMUSCULAR | Status: AC
  Administered 2012-06-11: 50 ug via INTRAVENOUS
  Filled 2012-06-11: qty 2

## 2012-06-11 MED ORDER — HYDROMORPHONE HCL PF 2 MG/ML IJ SOLN: 2.0000 mg | Freq: Once | INTRAMUSCULAR | Status: DC

## 2012-06-11 MED ORDER — SODIUM CHLORIDE 0.9 % IV SOLN
INTRAVENOUS | Status: AC
Start: 2012-06-11 — End: 2012-06-12
  Administered 2012-06-11: 150 mL/h via INTRAVENOUS
  Administered 2012-06-11: 05:00:00 via INTRAVENOUS
  Administered 2012-06-11: 150 mL/h via INTRAVENOUS

## 2012-06-11 MED ORDER — SODIUM CHLORIDE 0.9 % IJ SOLN
3.0000 mL | Freq: Two times a day (BID) | INTRAMUSCULAR | Status: DC
  Administered 2012-06-12 – 2012-06-13 (×2): 3 mL via INTRAVENOUS

## 2012-06-11 MED ORDER — HYDROMORPHONE HCL PF 1 MG/ML IJ SOLN
1.0000 mg | Freq: Once | INTRAMUSCULAR | Status: AC
  Administered 2012-06-11: 1 mg via INTRAVENOUS

## 2012-06-11 MED ORDER — PROMETHAZINE HCL 25 MG/ML IJ SOLN
12.5000 mg | Freq: Once | INTRAMUSCULAR | Status: DC
  Filled 2012-06-11: qty 1

## 2012-06-11 MED ORDER — PANTOPRAZOLE SODIUM 40 MG IV SOLR
40.0000 mg | INTRAVENOUS | Status: DC
  Administered 2012-06-12: 40 mg via INTRAVENOUS
  Filled 2012-06-11 (×2): qty 40

## 2012-06-11 MED ORDER — GADOBENATE DIMEGLUMINE 529 MG/ML IV SOLN
19.0000 mL | Freq: Once | INTRAVENOUS | Status: AC | PRN
  Administered 2012-06-11: 19 mL via INTRAVENOUS

## 2012-06-11 NOTE — ED Provider Notes (Addendum)
History     CSN: [id number]  Arrival date & time 06/10/12  7253   First MD Initiated Contact with Patient 06/11/12 0037      Chief Complaint  Patient presents with  . Abdominal Pain  . Nausea    (Consider location/radiation/quality/duration/timing/severity/associated sxs/prior treatment) HPI [place] of epigastric pain onset this afternoon. Typical of gastritis or pancreatitis he's had in the past. Patient has had multiple similar episodes since childhood patient reports he's vomited 30 times since onset of pain. Last bowel movement yesterday, "runny". No blood per rectum no hematemesis nothing makes the pain better or worse no treatment prior to coming here. No other associated symptoms Past Medical History  Diagnosis Date  . Pancreatitis   . Hepatitis C     History reviewed. No pertinent past surgical history.  History reviewed. No pertinent family history.  History  Substance Use Topics  . Smoking status: Heavy Tobacco Smoker -- 1.00 packs/day  . Smokeless tobacco: Not on file  . Alcohol Use: No   last used alcohol one month ago, marijuana use the    Review of Systems  Constitutional: Negative.   HENT: Negative.   Respiratory: Negative.   Cardiovascular: Negative.   Gastrointestinal: Positive for nausea, vomiting and abdominal pain.  Musculoskeletal: Negative.   Skin: Negative.   Neurological: Negative.   Psychiatric/Behavioral: Negative.   All other systems reviewed and are negative.    Allergies  Review of patient's allergies indicates no known allergies.  Home Medications  No current outpatient prescriptions on file.  BP 154/96  Pulse 76  Temp(Src) 97.8 F (36.6 C) (Oral)  Resp 12  SpO2 96%  Physical Exam  Nursing note and vitals reviewed. Constitutional: He appears well-developed and well-nourished. He appears distressed.   Appears uncomfortable   HENT:  Head: Normocephalic and atraumatic.  Eyes: Conjunctivae are normal. Pupils are equal,  round, and reactive to light.  Neck: Neck supple. No tracheal deviation present. No thyromegaly present.  Cardiovascular: Normal rate and regular rhythm.   No murmur heard. Pulmonary/Chest: Effort normal and breath sounds normal.  Abdominal: Soft. Bowel sounds are normal. He exhibits no distension. There is tenderness.  Mildly tender at epigastrium  Genitourinary: Penis normal.  Musculoskeletal: Normal range of motion. He exhibits no edema and no tenderness.  Neurological: He is alert. Coordination normal.  Skin: Skin is warm and dry. No rash noted.  Psychiatric: He has a normal mood and affect.    ED Course  Procedures (including critical care time)  Labs Reviewed  CBC WITH DIFFERENTIAL  COMPREHENSIVE METABOLIC PANEL  LIPASE, BLOOD   No results found. Results for orders placed during the hospital encounter of 06/10/12  CBC WITH DIFFERENTIAL      Result Value Range   WBC 17.3 (*) 4.0 - 10.5 K/uL   RBC 4.96  4.22 - 5.81 MIL/uL   Hemoglobin 15.5  13.0 - 17.0 g/dL   HCT 66.4  40.3 - 47.4 %   MCV 89.3  78.0 - 100.0 fL   MCH 31.3  26.0 - 34.0 pg   MCHC 35.0  30.0 - 36.0 g/dL   RDW 25.9  56.3 - 87.5 %   Platelets 252  150 - 400 K/uL   Neutrophils Relative 86 (*) 43 - 77 %   Neutro Abs 14.8 (*) 1.7 - 7.7 K/uL   Lymphocytes Relative 10 (*) 12 - 46 %   Lymphs Abs 1.7  0.7 - 4.0 K/uL   Monocytes Relative 4  3 - 12 %  Monocytes Absolute 0.7  0.1 - 1.0 K/uL   Eosinophils Relative 0  0 - 5 %   Eosinophils Absolute 0.0  0.0 - 0.7 K/uL   Basophils Relative 0  0 - 1 %   Basophils Absolute 0.1  0.0 - 0.1 K/uL  COMPREHENSIVE METABOLIC PANEL      Result Value Range   Sodium 137  135 - 145 mEq/L   Potassium 3.7  3.5 - 5.1 mEq/L   Chloride 99  96 - 112 mEq/L   CO2 22  19 - 32 mEq/L   Glucose, Bld 141 (*) 70 - 99 mg/dL   BUN 17  6 - 23 mg/dL   Creatinine, Ser 1.61  0.50 - 1.35 mg/dL   Calcium 09.6  8.4 - 04.5 mg/dL   Total Protein 9.2 (*) 6.0 - 8.3 g/dL   Albumin 4.7  3.5 - 5.2  g/dL   AST 409 (*) 0 - 37 U/L   ALT 1398 (*) 0 - 53 U/L   Alkaline Phosphatase 267 (*) 39 - 117 U/L   Total Bilirubin 2.1 (*) 0.3 - 1.2 mg/dL   GFR calc non Af Amer >90  >90 mL/min   GFR calc Af Amer >90  >90 mL/min  LIPASE, BLOOD      Result Value Range   Lipase 14  11 - 59 U/L   No results found.   No diagnosis found.  At 2 AM pain and nausea improved after treatment with Phenergan and hydromorphone. At 2:25 AM requesting more pain medicine. Additional hydromorphone ordered.  MDM  Spoke with Dr.Kakrakandy who will arrange for admission . Plan n.p.o. intravenous fluids, analgesia and antiemetics. Consider GI consult Diagnosis 1 abdominal pain #2 elevated liver function tests #3 hyperbilirubinemia #4 nausea and vomiting #5 hyperglycemia       Doug Sou, MD 06/11/12 0302  Doug Sou, MD 06/11/12 8119

## 2012-06-11 NOTE — Progress Notes (Signed)
Informed patient that his mri is scheduled for approximately 2pm today

## 2012-06-11 NOTE — Progress Notes (Addendum)
[facility] PROGRESS NOTE  Albert Peters ZOX:[id number] DOB: 10/02/1985 DOA: 06/10/2012 PCP: No primary Alif Petrak on file.  Brief Hx 27 year old male with polysubstance abuse including tobacco, cannabis presents with one-day history of intractable vomiting and abdominal pain. Patient states that he was able to eat a [employer] bacon cheeseburger prior to arrival. Denies fevers, chills, diarrhea, recent travels, sick contacts. Workup in the emergency department revealed AST 647, ALT 1308, alkaline phosphatase that is 267, total bili 2.1. Abdominal ultrasound essentially unremarkable. These values are double compared to 05/11/2012. Lipase was normal. Extensive workup has been undertaken as clarified below. Assessment/Plan: Transaminasemia/abdominal pain/intractible vomiting -Patient states that he follows with a gastroenterologist at [facility] for "gastritis and pancreatitis" -ANA, Viral hepatitis panel, HIV, ESR, lipid panel pending -Check ceruloplasmin -Check alpha-1 antitrypsin, AMA, ASMA -Await urine drug screen -Abdominal ultrasound shows heterogenous liver with prominent portal triads, 1.4 x 1.2cm hypoechoic focus in the liver, normal gallbladder -CT abdomen -IVF Polysubstance abuse -05/11/2012 UDS--positive for THC, barbituates -Denies any alcohol use in over one year, but abused in the past -Denies use of any supplements from nutritional health stores -last used cannibis 06/10/12 Leukocytosis -Urinalysis -Blood cultures x2 sets      Disposition Plan:   Home when medically stable Total time spent     Procedures/Studies: Dg Abd 1 View  06/11/2012  *RADIOLOGY REPORT*  Clinical Data: Abdominal pain  ABDOMEN - 1 VIEW  Comparison: 05/11/2012 CT  Findings: The bowel gas pattern is non-obstructive. Organ outlines are normal where seen. No acute or aggressive osseous abnormality identified.  Hemidiaphragms excluded from the image.  IMPRESSION: Nonobstructive bowel gas  pattern.   Original Report Authenticated By: Jearld Lesch, M.D.    US Abdomen Complete  06/11/2012  *RADIOLOGY REPORT*  Clinical Data:  Elevated LFTs, abdominal pain.  Hepatitis C.  COMPLETE ABDOMINAL ULTRASOUND  Comparison:  05/11/2012 CT  Findings:  Gallbladder:  No gallstones, gallbladder wall thickening, or pericholecystic fluid.  Common bile duct:  Normal diameter at 4 mm.  Liver:  Heterogeneous in echogenicity with prominent portal triads. There is a 1.4 x 1.2 x 1.0 cm hyperechoic focus, corresponding to the hypodensity seen on recent CT.  IVC:  Appears normal.  Pancreas:  Poorly visualized due to limited acoustic windows.  No focal abnormality within the neck.  The head, body, and tail are partially obscured.  Spleen:  Measures 10 cm.  Within normal limits.  Right Kidney:  Measures 11 cm.  No hydronephrosis or focal abnormality.  Left Kidney:  Measures 12 cm.  No hydronephrosis or focal abnormality.  Abdominal aorta:  No aneurysm identified.  IMPRESSION: 1.4 cm hyperechoic focus within the liver is favored to reflect a hemangioma. Given the stated history of hepatitis C, recommend this be confirmed with a non emergent MRI follow-up.  Heterogeneous hepatic echogenicity with prominence of the portal triads, suggests underlying hepatitis.   Original Report Authenticated By: Jearld Lesch, M.D.          Subjective: Patient denies fevers, chills, chest pain, shortness breath, dysuria, hematuria, rashes, dizziness, syncope. He states his abdominal pain is 25% better. No more vomiting since arrival onto the floor.  Objective: Filed Vitals:   06/11/12 0043 06/11/12 0128 06/11/12 0500 06/11/12 0515  BP: 154/96 122/72 123/70   Pulse: 76 79 111   Temp:   98.5 F (36.9 C)   TempSrc:   Oral   Resp: 12 16 16    Height:    6\' 2"  (1.88 m)  Weight:    92.035 kg (202 lb 14.4 oz)  SpO2: 96% 98% 98%     Intake/Output Summary (Last 24 hours) at 06/11/12 0757 Last data filed at 06/11/12 0516   Gross per 24 hour  Intake      0 ml  Output      0 ml  Net      0 ml   Weight change:  Exam:   General:  Pt is alert, follows commands appropriately, not in acute distress  HEENT: No icterus, No thrush,[place]/AT  Cardiovascular: RRR, S1/S2, no rubs, no gallops  Respiratory: CTA bilaterally, no wheezing, no crackles, no rhonchi  Abdomen: Soft/+BS, epigastric tenderness without rebound or peritoneal signs, non distended, no guarding  Extremities: No edema, No lymphangitis, No petechiae, No rashes, no synovitis  Data Reviewed: Basic Metabolic Panel:  Recent Labs Lab 06/11/12 0010 06/11/12 0640  NA 137 136  K 3.7 4.2  CL 99 101  CO2 22 23  GLUCOSE 141* 119*  BUN 17 17  CREATININE 0.81 0.85  CALCIUM 10.4 8.9   Liver Function Tests:  Recent Labs Lab 06/11/12 0010 06/11/12 0640  AST 647* 393*  ALT 1398* 986*  ALKPHOS 267* 199*  BILITOT 2.1* 1.5*  PROT 9.2* 7.1  ALBUMIN 4.7 3.5    Recent Labs Lab 06/11/12 0010  LIPASE 14   No results found for this basename: AMMONIA,  in the last 168 hours CBC:  Recent Labs Lab 06/11/12 0010 06/11/12 0640  WBC 17.3* 13.8*  NEUTROABS 14.8* 10.7*  HGB 15.5 12.8*  HCT 44.3 37.7*  MCV 89.3 90.2  PLT 252 188   Cardiac Enzymes:  Recent Labs Lab 06/11/12 0240  TROPONINI <0.30   BNP: No components found with this basename: POCBNP,  CBG:  Recent Labs Lab 06/11/12 0548  GLUCAP 91    No results found for this or any previous visit (from the past 240 hour(s)).   Scheduled Meds: . pantoprazole (PROTONIX) IV  40 mg Intravenous Q24H  . sodium chloride  3 mL Intravenous Q12H   Continuous Infusions: . sodium chloride 150 mL/hr at 06/11/12 0511     TAT, DAVID, DO  [facility] Pager [phone]  If 7PM-7AM, please contact night-coverage [email or web address] Password [facility] 06/11/2012, 7:57 AM   LOS: 1 day

## 2012-06-11 NOTE — H&P (Signed)
[facility] History and Physical  Jerrin Recore WUJ:[id number] DOB: Jun 01, 1985 DOA: 06/10/2012  Referring physician: Dr.Jacubowictz. PCP: No primary provider on file.  Specialists: None.  Chief Complaint: Abdominal pain.  HPI: Albert Peters is a 27 y.o. male with previous history of pancreatitis and gastritis presents to the ER because of worsening abdominal pain since last evening 4 PM. The pain is located in the epigastric area severe stabbing in nature. Associated with it as nausea vomiting and had one episode of diarrhea. In the ER patient was given pain relief medications and labs reveal markedly elevated LFTs. Patient had come with similar symptoms last month and at that time CT abdomen and pelvis was unremarkable and his LFTs at that time was also was elevated but now it has doubled to what it was. Patient states that he has only taken some NyQuil few weeks ago and had taken Percocet which was given to him last time and that also was few days ago. Denies having taking any NSAIDs or Tylenol. Denies any fever chills. Patient has been admitted for further management.  Patient states that he has been admitted several times before for acute pancreatitis last time being in last November. He was admitted at [facility] and was told that he had pancreatitis from his alcoholism and diet. He has not drank alcohol for last year and a half. Denies any chest pain shortness of breath.  Review of Systems: As presented in the history of presenting illness nothing else significant.  Past Medical History  Diagnosis Date  . Pancreatitis   . Hepatitis C    History reviewed. No pertinent past surgical history. Social History:  reports that he has been smoking.  He does not have any smokeless tobacco history on file. He reports that he uses illicit drugs (Marijuana). He reports that he does not drink alcohol. Lives at home. where does patient live-- Can do ADLs. Can patient participate in ADLs?  No  Known Allergies  Family History  Problem Relation Age of Onset  . CAD Father       Prior to Admission medications   Not on File   Physical Exam: Filed Vitals:   06/10/12 2112 06/11/12 0043 06/11/12 0128  BP: 134/89 154/96 122/72  Pulse: 94 76 79  Temp: 97.8 F (36.6 C)    TempSrc: Oral    Resp: 24 12 16   SpO2: 100% 96% 98%     General:  Well-developed well-nourished.  Eyes: Anicteric no pallor.  ENT: No discharge from the ears eyes nose and mouth.  Neck: No mass or JVD appreciated.  Cardiovascular: S1-S2 heard.  Respiratory: No rhonchi no crepitations.  Abdomen: Soft tenderness in the epigastric area no guarding or rigidity bowel sounds present.  Skin: No rash.  Musculoskeletal: No edema or joint effusions.  Psychiatric: Appears normal.  Neurologic: Alert awake oriented to time place and person. Moves all extremities.  Labs on Admission:  Basic Metabolic Panel:  Recent Labs Lab 06/11/12 0010  NA 137  K 3.7  CL 99  CO2 22  GLUCOSE 141*  BUN 17  CREATININE 0.81  CALCIUM 10.4   Liver Function Tests:  Recent Labs Lab 06/11/12 0010  AST 647*  ALT 1398*  ALKPHOS 267*  BILITOT 2.1*  PROT 9.2*  ALBUMIN 4.7    Recent Labs Lab 06/11/12 0010  LIPASE 14   No results found for this basename: AMMONIA,  in the last 168 hours CBC:  Recent Labs Lab 06/11/12 0010  WBC 17.3*  NEUTROABS 14.8*  HGB 15.5  HCT 44.3  MCV 89.3  PLT 252   Cardiac Enzymes: No results found for this basename: CKTOTAL, CKMB, CKMBINDEX, TROPONINI,  in the last 168 hours  BNP (last 3 results) No results found for this basename: PROBNP,  in the last 8760 hours CBG: No results found for this basename: GLUCAP,  in the last 168 hours  Radiological Exams on Admission: No results found.  Assessment/Plan Active Problems:   Elevated LFTs   Abdominal pain   History of pancreatitis   Gastritis   Tobacco abuse   1. Abdominal pain with markedly elevated LFTs in a  patient with known history of pancreatitis - patient at this time will be kept n.p.o. Acute abdominal series has been ordered. Check Tylenol level, acute hepatitis panel, INR. Sonogram of the abdomen and MRCP to rule out any obstruction or mass and check into any pancreatic lesions. Since patient's globulin is slightly elevated and albumin ANA and sedimentation rate and HIV has been ordered. Check lipid panel for triglycerides. Continue with pain relief medications and hydration. Consult GI. 2. Leukocytosis - patient is afebrile could be from reactionary. Follow CBC. 3. Polysubstance abuse - advised to quit smoking and marijuana abuse. Patient has quit drinking alcohol for more than a year.    Code Status: Full code.  Family Communication: None.  Disposition Plan: Admit to inpatient.    KAKRAKANDY,ARSHAD N. [facility] Pager [phone].  If 7PM-7AM, please contact night-coverage [email or web address] Password [facility] 06/11/2012, 2:52 AM

## 2012-06-11 NOTE — Consult Note (Signed)
Reason for Consult: Abnormal liver enzymes. Referring Physician: [facility]  [name] HPI: This is a 27 year old male with a reported history of pancreatitis in the past who is admitted for abdominal pain, nausea, and vomiting.  Further work up also revealed markedly elevated liver enzymes. The patient reports having issues with his abdomen for the past two weeks, but then 1 days prior to admission he started to have a worsening of his abdominal pain.  He thought that it was secondary to hunger pains, but it continued to progress.  Imaging of the liver was negative for any overt abnormalities.  Certainly there is no evidence of pancreatitis.  He denies a history of HCV and he does not report any IVDA or ETOH abuse.  There is no report of any unprotected sexual encounters.  In the past she was treated at [facility] and he reports having issues with pancreatitis of unknown etiology.  Past Medical History  Diagnosis Date  . Pancreatitis   . Hepatitis C     History reviewed. No pertinent past surgical history.  Family History  Problem Relation Age of Onset  . CAD Father     Social History:  reports that he has been smoking.  He does not have any smokeless tobacco history on file. He reports that he uses illicit drugs (Marijuana). He reports that he does not drink alcohol.  Allergies: No Known Allergies  Medications:  Scheduled: . pantoprazole (PROTONIX) IV  40 mg Intravenous Q24H  . sodium chloride  3 mL Intravenous Q12H   Continuous: . sodium chloride 150 mL/hr (06/11/12 1541)    Results for orders placed during the hospital encounter of 06/10/12 (from the past 24 hour(s))  CBC WITH DIFFERENTIAL     Status: Abnormal   Collection Time    06/11/12 12:10 AM      Result Value Range   WBC 17.3 (*) 4.0 - 10.5 K/uL   RBC 4.96  4.22 - 5.81 MIL/uL   Hemoglobin 15.5  13.0 - 17.0 g/dL   HCT 45.4  09.8 - 11.9 %   MCV 89.3  78.0 - 100.0 fL   MCH 31.3  26.0 - 34.0 pg   MCHC  35.0  30.0 - 36.0 g/dL   RDW 14.7  82.9 - 56.2 %   Platelets 252  150 - 400 K/uL   Neutrophils Relative 86 (*) 43 - 77 %   Neutro Abs 14.8 (*) 1.7 - 7.7 K/uL   Lymphocytes Relative 10 (*) 12 - 46 %   Lymphs Abs 1.7  0.7 - 4.0 K/uL   Monocytes Relative 4  3 - 12 %   Monocytes Absolute 0.7  0.1 - 1.0 K/uL   Eosinophils Relative 0  0 - 5 %   Eosinophils Absolute 0.0  0.0 - 0.7 K/uL   Basophils Relative 0  0 - 1 %   Basophils Absolute 0.1  0.0 - 0.1 K/uL  COMPREHENSIVE METABOLIC PANEL     Status: Abnormal   Collection Time    06/11/12 12:10 AM      Result Value Range   Sodium 137  135 - 145 mEq/L   Potassium 3.7  3.5 - 5.1 mEq/L   Chloride 99  96 - 112 mEq/L   CO2 22  19 - 32 mEq/L   Glucose, Bld 141 (*) 70 - 99 mg/dL   BUN 17  6 - 23 mg/dL   Creatinine, Ser 1.30  0.50 - 1.35 mg/dL   Calcium  10.4  8.4 - 10.5 mg/dL   Total Protein 9.2 (*) 6.0 - 8.3 g/dL   Albumin 4.7  3.5 - 5.2 g/dL   AST 086 (*) 0 - 37 U/L   ALT 1398 (*) 0 - 53 U/L   Alkaline Phosphatase 267 (*) 39 - 117 U/L   Total Bilirubin 2.1 (*) 0.3 - 1.2 mg/dL   GFR calc non Af Amer >90  >90 mL/min   GFR calc Af Amer >90  >90 mL/min  LIPASE, BLOOD     Status: None   Collection Time    06/11/12 12:10 AM      Result Value Range   Lipase 14  11 - 59 U/L  ACETAMINOPHEN LEVEL     Status: None   Collection Time    06/11/12  2:40 AM      Result Value Range   Acetaminophen (Tylenol), Serum <15.0  10 - 30 ug/mL  PROTIME-INR     Status: None   Collection Time    06/11/12  2:40 AM      Result Value Range   Prothrombin Time 12.4  11.6 - 15.2 seconds   INR 0.93  0.00 - 1.49  TROPONIN I     Status: None   Collection Time    06/11/12  2:40 AM      Result Value Range   Troponin I <0.30  <0.30 ng/mL  GLUCOSE, CAPILLARY     Status: None   Collection Time    06/11/12  5:48 AM      Result Value Range   Glucose-Capillary 91  70 - 99 mg/dL  HEPATIC FUNCTION PANEL     Status: Abnormal   Collection Time    06/11/12  6:40 AM       Result Value Range   Total Protein 7.1  6.0 - 8.3 g/dL   Albumin 3.5  3.5 - 5.2 g/dL   AST 578 (*) 0 - 37 U/L   ALT 986 (*) 0 - 53 U/L   Alkaline Phosphatase 199 (*) 39 - 117 U/L   Total Bilirubin 1.5 (*) 0.3 - 1.2 mg/dL   Bilirubin, Direct 0.8 (*) 0.0 - 0.3 mg/dL   Indirect Bilirubin 0.7  0.3 - 0.9 mg/dL  PROTIME-INR     Status: None   Collection Time    06/11/12  6:40 AM      Result Value Range   Prothrombin Time 12.9  11.6 - 15.2 seconds   INR 0.98  0.00 - 1.49  BASIC METABOLIC PANEL     Status: Abnormal   Collection Time    06/11/12  6:40 AM      Result Value Range   Sodium 136  135 - 145 mEq/L   Potassium 4.2  3.5 - 5.1 mEq/L   Chloride 101  96 - 112 mEq/L   CO2 23  19 - 32 mEq/L   Glucose, Bld 119 (*) 70 - 99 mg/dL   BUN 17  6 - 23 mg/dL   Creatinine, Ser 4.69  0.50 - 1.35 mg/dL   Calcium 8.9  8.4 - 62.9 mg/dL   GFR calc non Af Amer >90  >90 mL/min   GFR calc Af Amer >90  >90 mL/min  CBC WITH DIFFERENTIAL     Status: Abnormal   Collection Time    06/11/12  6:40 AM      Result Value Range   WBC 13.8 (*) 4.0 - 10.5 K/uL   RBC 4.18 (*) 4.22 - 5.81 MIL/uL  Hemoglobin 12.8 (*) 13.0 - 17.0 g/dL   HCT 40.9 (*) 81.1 - 91.4 %   MCV 90.2  78.0 - 100.0 fL   MCH 30.6  26.0 - 34.0 pg   MCHC 34.0  30.0 - 36.0 g/dL   RDW 78.2  95.6 - 21.3 %   Platelets 188  150 - 400 K/uL   Neutrophils Relative 77  43 - 77 %   Neutro Abs 10.7 (*) 1.7 - 7.7 K/uL   Lymphocytes Relative 15  12 - 46 %   Lymphs Abs 2.1  0.7 - 4.0 K/uL   Monocytes Relative 7  3 - 12 %   Monocytes Absolute 1.0  0.1 - 1.0 K/uL   Eosinophils Relative 0  0 - 5 %   Eosinophils Absolute 0.0  0.0 - 0.7 K/uL   Basophils Relative 0  0 - 1 %   Basophils Absolute 0.0  0.0 - 0.1 K/uL  SEDIMENTATION RATE     Status: None   Collection Time    06/11/12  6:40 AM      Result Value Range   Sed Rate 3  0 - 16 mm/hr  HIV ANTIBODY (ROUTINE TESTING)     Status: None   Collection Time    06/11/12  6:40 AM      Result Value  Range   HIV NON REACTIVE  NON REACTIVE  LIPID PANEL     Status: None   Collection Time    06/11/12  6:40 AM      Result Value Range   Cholesterol 146  0 - 200 mg/dL   Triglycerides 87  <086 mg/dL   HDL 40  >57 mg/dL   Total CHOL/HDL Ratio 3.7     VLDL 17  0 - 40 mg/dL   LDL Cholesterol 89  0 - 99 mg/dL  ANTI-SMOOTH MUSCLE ANTIBODY, IGG     Status: None   Collection Time    06/11/12  8:30 AM      Result Value Range   F-Actin IgG 6  <20 U  URINE RAPID DRUG SCREEN (HOSP PERFORMED)     Status: Abnormal   Collection Time    06/11/12 10:00 AM      Result Value Range   Opiates POSITIVE (*) NONE DETECTED   Cocaine NONE DETECTED  NONE DETECTED   Benzodiazepines NONE DETECTED  NONE DETECTED   Amphetamines NONE DETECTED  NONE DETECTED   Tetrahydrocannabinol POSITIVE (*) NONE DETECTED   Barbiturates NONE DETECTED  NONE DETECTED  URINALYSIS, MICROSCOPIC ONLY     Status: Abnormal   Collection Time    06/11/12 10:00 AM      Result Value Range   Color, Urine AMBER (*) YELLOW   APPearance CLEAR  CLEAR   Specific Gravity, Urine 1.023  1.005 - 1.030   pH 6.0  5.0 - 8.0   Glucose, UA NEGATIVE  NEGATIVE mg/dL   Hgb urine dipstick NEGATIVE  NEGATIVE   Bilirubin Urine NEGATIVE  NEGATIVE   Ketones, ur NEGATIVE  NEGATIVE mg/dL   Protein, ur NEGATIVE  NEGATIVE mg/dL   Urobilinogen, UA 1.0  0.0 - 1.0 mg/dL   Nitrite NEGATIVE  NEGATIVE   Leukocytes, UA NEGATIVE  NEGATIVE   WBC, UA 0-2  <3 WBC/hpf   Bacteria, UA RARE  RARE   Squamous Epithelial / LPF RARE  RARE  GLUCOSE, CAPILLARY     Status: None   Collection Time    06/11/12 12:58 PM  Result Value Range   Glucose-Capillary 94  70 - 99 mg/dL   Comment 1 Notify RN    GLUCOSE, CAPILLARY     Status: None   Collection Time    06/11/12  5:58 PM      Result Value Range   Glucose-Capillary 85  70 - 99 mg/dL     Dg Abd 1 View  05/17/863  *RADIOLOGY REPORT*  Clinical Data: Abdominal pain  ABDOMEN - 1 VIEW  Comparison: 05/11/2012 CT   Findings: The bowel gas pattern is non-obstructive. Organ outlines are normal where seen. No acute or aggressive osseous abnormality identified.  Hemidiaphragms excluded from the image.  IMPRESSION: Nonobstructive bowel gas pattern.   Original Report Authenticated By: Jearld Lesch, M.D.    US Abdomen Complete  06/11/2012  *RADIOLOGY REPORT*  Clinical Data:  Elevated LFTs, abdominal pain.  Hepatitis C.  COMPLETE ABDOMINAL ULTRASOUND  Comparison:  05/11/2012 CT  Findings:  Gallbladder:  No gallstones, gallbladder wall thickening, or pericholecystic fluid.  Common bile duct:  Normal diameter at 4 mm.  Liver:  Heterogeneous in echogenicity with prominent portal triads. There is a 1.4 x 1.2 x 1.0 cm hyperechoic focus, corresponding to the hypodensity seen on recent CT.  IVC:  Appears normal.  Pancreas:  Poorly visualized due to limited acoustic windows.  No focal abnormality within the neck.  The head, body, and tail are partially obscured.  Spleen:  Measures 10 cm.  Within normal limits.  Right Kidney:  Measures 11 cm.  No hydronephrosis or focal abnormality.  Left Kidney:  Measures 12 cm.  No hydronephrosis or focal abnormality.  Abdominal aorta:  No aneurysm identified.  IMPRESSION: 1.4 cm hyperechoic focus within the liver is favored to reflect a hemangioma. Given the stated history of hepatitis C, recommend this be confirmed with a non emergent MRI follow-up.  Heterogeneous hepatic echogenicity with prominence of the portal triads, suggests underlying hepatitis.   Original Report Authenticated By: Jearld Lesch, M.D.    Mr 3d Recon At Scanner  06/11/2012  *RADIOLOGY REPORT*  Clinical Data:  Abdominal pain with elevated liver function studies.  History of hepatitis C and pancreatitis.  MRI ABDOMEN WITHOUT AND WITH CONTRAST (INCLUDING MRCP)  Technique:  Multiplanar multisequence MR imaging of the abdomen was performed both before and after the administration of intravenous contrast. Heavily T2-weighted  images of the biliary and pancreatic ducts were obtained, and three-dimensional MRCP images were rendered by post processing.  Contrast: 19mL MULTIHANCE GADOBENATE DIMEGLUMINE 529 MG/ML IV SOLN  Comparison:  Abdominal ultrasound 06/11/2012.  Abdominal CT 05/11/2012.  Findings:  Some of the thin section MRCP and postcontrast images are motion degraded.  There is no evidence of biliary dilatation. There is no evidence of cholelithiasis or choledocholithiasis.  The gallbladder appears normal.  The pancreatic duct is not dilated.  There is no evidence of pancreas divisum.  The pancreas appears normal without surrounding edema or fluid collection.  The pancreas enhances normally following contrast.  As demonstrated on the recent studies, there is a 1 cm well circumscribed lesion centrally in the right hepatic lobe which demonstrates low T1 and high T2 signal.  There is another similar lesion in the medial segment the left hepatic lobe which measures 1.3 cm in diameter.  The lesion in the right hepatic lobe demonstrates peripheral discontinuous enhancement typical of a hemangioma.  The lesion is not fully opacified on delayed imaging. The lesion in the left hepatic lobe demonstrates earlier discontinuous peripheral enhancement and is  fully opacified on delayed imaging.  There are no suspicious liver lesions.  The hepatic contours are normal.  The spleen, gallbladder, adrenal glands and kidneys appear normal. Small lymph nodes within the porta hepatis are nonspecific.  IMPRESSION:  1.  Negative MRCP.  No evidence of biliary dilatation or choledocholithiasis. 2.  No evidence of complicated pancreatitis. 3.  There are two small liver lesions with imaging characteristics typical of hemangiomas.  No suspicious liver lesions identified. 4.  Nonspecific lymph nodes in the porta hepatis, possibly related to reported hepatitis C.   Original Report Authenticated By: Carey Bullocks, M.D.    Mr Abd W/wo Cm/mrcp  06/11/2012   *RADIOLOGY REPORT*  Clinical Data:  Abdominal pain with elevated liver function studies.  History of hepatitis C and pancreatitis.  MRI ABDOMEN WITHOUT AND WITH CONTRAST (INCLUDING MRCP)  Technique:  Multiplanar multisequence MR imaging of the abdomen was performed both before and after the administration of intravenous contrast. Heavily T2-weighted images of the biliary and pancreatic ducts were obtained, and three-dimensional MRCP images were rendered by post processing.  Contrast: 19mL MULTIHANCE GADOBENATE DIMEGLUMINE 529 MG/ML IV SOLN  Comparison:  Abdominal ultrasound 06/11/2012.  Abdominal CT 05/11/2012.  Findings:  Some of the thin section MRCP and postcontrast images are motion degraded.  There is no evidence of biliary dilatation. There is no evidence of cholelithiasis or choledocholithiasis.  The gallbladder appears normal.  The pancreatic duct is not dilated.  There is no evidence of pancreas divisum.  The pancreas appears normal without surrounding edema or fluid collection.  The pancreas enhances normally following contrast.  As demonstrated on the recent studies, there is a 1 cm well circumscribed lesion centrally in the right hepatic lobe which demonstrates low T1 and high T2 signal.  There is another similar lesion in the medial segment the left hepatic lobe which measures 1.3 cm in diameter.  The lesion in the right hepatic lobe demonstrates peripheral discontinuous enhancement typical of a hemangioma.  The lesion is not fully opacified on delayed imaging. The lesion in the left hepatic lobe demonstrates earlier discontinuous peripheral enhancement and is fully opacified on delayed imaging.  There are no suspicious liver lesions.  The hepatic contours are normal.  The spleen, gallbladder, adrenal glands and kidneys appear normal. Small lymph nodes within the porta hepatis are nonspecific.  IMPRESSION:  1.  Negative MRCP.  No evidence of biliary dilatation or choledocholithiasis. 2.  No evidence of  complicated pancreatitis. 3.  There are two small liver lesions with imaging characteristics typical of hemangiomas.  No suspicious liver lesions identified. 4.  Nonspecific lymph nodes in the porta hepatis, possibly related to reported hepatitis C.   Original Report Authenticated By: Carey Bullocks, M.D.     ROS:  As stated above in the HPI otherwise negative.  Blood pressure 99/67, pulse 75, temperature 98.4 F (36.9 C), temperature source Oral, resp. rate 16, height 6\' 2"  (1.88 m), weight 202 lb 14.4 oz (92.035 kg), SpO2 97.00%.    PE: Gen: NAD, Alert and Oriented HEENT:  [place]/AT, EOMI Neck: Supple, no LAD Lungs: CTA Bilaterally CV: RRR without M/G/R ABM: Soft, tender in the mid abdomen, +BS Ext: No C/C/E  Assessment/Plan: 1) Elevated liver enzymes.   I reviewed all of the blood work in detail.  He does have a marked elevation in his AST/ALT, but there is no evidence of a toxic ingestion.  His acute hepatitis liver panel is pending at this time.  Imaging is negative for any overt  clots to suggest Budd-Chiari syndrome.  Another consideration is an autoimmune process, but I think it will be prudent to await the acute hepatitis panel before going down the autoimmune pathway.  Plan: 1) Follow liver enzymes. 2) Make sure that there is no evidence of hepatic encephalopathy as this would mean a rapid deterioration of his clinical status. 3) Await the acute hepatitis panel. 4) Continue with supportive care.  HUNG,PATRICK D 06/11/2012, 6:13 PM

## 2012-06-12 DIAGNOSIS — F1210 Cannabis abuse, uncomplicated: Secondary | ICD-10-CM

## 2012-06-12 LAB — COMPREHENSIVE METABOLIC PANEL
Albumin: 3.1 g/dL — ABNORMAL LOW (ref 3.5–5.2)
Alkaline Phosphatase: 159 U/L — ABNORMAL HIGH (ref 39–117)
BUN: 13 mg/dL (ref 6–23)
CO2: 28 mEq/L (ref 19–32)
Chloride: 105 mEq/L (ref 96–112)
Creatinine, Ser: 0.93 mg/dL (ref 0.50–1.35)
GFR calc Af Amer: >90 mL/min (ref >90)
GFR calc non Af Amer: >90 mL/min (ref >90)
Glucose, Bld: 103 mg/dL — ABNORMAL HIGH (ref 70–99)
Potassium: 4 mEq/L (ref 3.5–5.1)
Total Bilirubin: 1.2 mg/dL (ref 0.3–1.2)

## 2012-06-12 LAB — CBC WITH DIFFERENTIAL/PLATELET
Basophils Absolute: 0.1 10*3/uL (ref 0.0–0.1)
Eosinophils Relative: 5 % (ref 0–5)
Lymphocytes Relative: 39 % (ref 12–46)
Lymphs Abs: 3.4 10*3/uL (ref 0.7–4.0)
MCV: 92.2 fL (ref 78.0–100.0)
Monocytes Relative: 12 % (ref 3–12)
Neutrophils Relative %: 43 % (ref 43–77)
Platelets: 191 10*3/uL (ref 150–400)
RBC: 3.98 MIL/uL — ABNORMAL LOW (ref 4.22–5.81)
RDW: 14.5 % (ref 11.5–15.5)
WBC: 8.8 10*3/uL (ref 4.0–10.5)

## 2012-06-12 LAB — ANA: Anti Nuclear Antibody(ANA): NEGATIVE

## 2012-06-12 LAB — GLUCOSE, CAPILLARY
Glucose-Capillary: 106 mg/dL — ABNORMAL HIGH (ref 70–99)
Glucose-Capillary: 131 mg/dL — ABNORMAL HIGH (ref 70–99)
Glucose-Capillary: 91 mg/dL (ref 70–99)

## 2012-06-12 LAB — HEPATITIS PANEL, ACUTE
Hep A IgM: NEGATIVE
Hep B C IgM: NEGATIVE
Hepatitis B Surface Ag: NEGATIVE

## 2012-06-12 LAB — ALPHA-1-ANTITRYPSIN: A-1 Antitrypsin, Ser: 159 mg/dL (ref 90–200)

## 2012-06-12 MED ORDER — SUCRALFATE 1 GM/10ML PO SUSP
1.0000 g | Freq: Three times a day (TID) | ORAL | Status: DC
  Administered 2012-06-12 – 2012-06-14 (×7): 1 g via ORAL
  Filled 2012-06-12 (×10): qty 10

## 2012-06-12 MED ORDER — OXYCODONE HCL 5 MG PO TABS
15.0000 mg | ORAL_TABLET | ORAL | Status: DC | PRN
  Administered 2012-06-12 – 2012-06-14 (×6): 15 mg via ORAL
  Filled 2012-06-12 (×6): qty 3

## 2012-06-12 MED ORDER — SODIUM CHLORIDE 0.9 % IV SOLN
INTRAVENOUS | Status: DC
  Administered 2012-06-12: 06:00:00 via INTRAVENOUS
  Administered 2012-06-12: 150 mL/h via INTRAVENOUS
  Administered 2012-06-13: 07:00:00 via INTRAVENOUS

## 2012-06-12 MED ORDER — ENOXAPARIN SODIUM 40 MG/0.4ML ~~LOC~~ SOLN
40.0000 mg | SUBCUTANEOUS | Status: DC
  Administered 2012-06-12 – 2012-06-13 (×2): 40 mg via SUBCUTANEOUS
  Filled 2012-06-12 (×3): qty 0.4

## 2012-06-12 MED ORDER — PANTOPRAZOLE SODIUM 40 MG IV SOLR
40.0000 mg | Freq: Two times a day (BID) | INTRAVENOUS | Status: DC
  Administered 2012-06-12 – 2012-06-14 (×4): 40 mg via INTRAVENOUS
  Filled 2012-06-12 (×5): qty 40

## 2012-06-12 NOTE — Progress Notes (Signed)
[facility] PROGRESS NOTE  Albert Peters NWG:[id number] DOB: 11-28-1985 DOA: 06/10/2012 PCP: No primary Albert Peters on file.  Assessment/Plan  Transaminasemia/abdominal pain/intractible vomiting improving -Patient states that he follows with a gastroenterologist at [facility] for "gastritis and pancreatitis"  -  HCV positive (known diagnosis) -  Hep B SAg neg -  Hep A pending  -ANA neg, Viral hepatitis panel, HIV NR, ESR 3, lipid panel wnl -Check ceruloplasmin 22 wnl -Check alpha-1 antitrypsin wnl, AMA, ASMA 6 wnl -urine drug screen positive for opiates and THC  -Abdominal ultrasound shows heterogenous liver with prominent portal triads, 1.4 x 1.2cm hypoechoic focus in the liver, normal gallbladder  -  MRCP negative.  Two small liver lesions c/w hemangiomas -  Increase to BID PPI (transition to oral tomorrow if tolerating full liquid diet)  Polysubstance abuse  -05/11/2012 UDS--positive for THC, barbituates  -Denies any alcohol use in over one year, but abused in the past  -Denies use of any supplements from nutritional health stores  -last used cannibis 06/10/12   Leukocytosis resolved, likely inflammatory response -Urinalysis neg -Blood cultures x2 sets NGTD  Diet:  Advance to full liquid  Access:  PIV IVF:  NS at 161ml/h Proph:  lovenox  Code Status: full Family Communication: spoke with patient alone Disposition Plan: pending tolerating diet   Consultants:  GI  Procedures:  MRCP  Antibiotics:  none   HPI/Subjective:  Denies fevers, chills, headache.  Denies chest pain and shortness of breath.  + nausea but no recent vomiting.  Still has epigastric and right upper quadrant abdominal pain.  Denies const, diarrhea.   Has been drinking well.  Has been out of bed.      Objective: Filed Vitals:   06/11/12 2300 06/12/12 0600 06/12/12 1418 06/12/12 1514  BP: 116/65 118/78 138/82   Pulse: 67 60 80   Temp: 98 F (36.7 C) 98.1 F (36.7 C) 98.2 F (36.8 C)    TempSrc: Oral Oral Oral   Resp: 16 16 16    Height:      Weight:    91.627 kg (202 lb)  SpO2: 99% 97% 100%     Intake/Output Summary (Last 24 hours) at 06/12/12 1524 Last data filed at 06/12/12 1300  Gross per 24 hour  Intake    240 ml  Output      0 ml  Net    240 ml   Filed Weights   06/11/12 0515 06/12/12 1514  Weight: 92.035 kg (202 lb 14.4 oz) 91.627 kg (202 lb)    Exam:   General:  Avg wt AAM, No acute distress  HEENT:  NCAT, MMM  Cardiovascular:  RRR, nl S1, S2 no mrg, 2+ pulses, warm extremities  Respiratory:  CTAB, no increased WOB  Abdomen:   NABS, soft, mildly TTP in the epigastric region, ND  MSK:   Normal tone and bulk, no LEE  Neuro:  Grossly intact  Data Reviewed: Basic Metabolic Panel:  Recent Labs Lab 06/11/12 0010 06/11/12 0640 06/12/12 0515  NA 137 136 139  K 3.7 4.2 4.0  CL 99 101 105  CO2 22 23 28   GLUCOSE 141* 119* 103*  BUN 17 17 13   CREATININE 0.81 0.85 0.93  CALCIUM 10.4 8.9 8.6   Liver Function Tests:  Recent Labs Lab 06/11/12 0010 06/11/12 0640 06/12/12 0515  AST 647* 393* 295*  ALT 1398* 986* 768*  ALKPHOS 267* 199* 159*  BILITOT 2.1* 1.5* 1.2  PROT 9.2* 7.1 6.3  ALBUMIN 4.7 3.5  3.1*    Recent Labs Lab 06/11/12 0010  LIPASE 14   No results found for this basename: AMMONIA,  in the last 168 hours CBC:  Recent Labs Lab 06/11/12 0010 06/11/12 0640 06/12/12 0515  WBC 17.3* 13.8* 8.8  NEUTROABS 14.8* 10.7* 3.8  HGB 15.5 12.8* 11.9*  HCT 44.3 37.7* 36.7*  MCV 89.3 90.2 92.2  PLT 252 188 191   Cardiac Enzymes:  Recent Labs Lab 06/11/12 0240  TROPONINI <0.30   BNP (last 3 results) No results found for this basename: PROBNP,  in the last 8760 hours CBG:  Recent Labs Lab 06/11/12 1258 06/11/12 1758 06/12/12 0008 06/12/12 0625 06/12/12 1329  GLUCAP 94 85 106* 109* 131*    Recent Results (from the past 240 hour(s))  CULTURE, BLOOD (ROUTINE X 2)     Status: None   Collection Time     06/11/12  8:30 AM      Result Value Range Status   Specimen Description BLOOD LEFT ANTECUBITAL   Final   Special Requests BOTTLES DRAWN AEROBIC AND ANAEROBIC 5CC EACH   Final   Culture  Setup Time 06/11/2012 12:32   Final   Culture     Final   Value:        BLOOD CULTURE RECEIVED NO GROWTH TO DATE CULTURE WILL BE HELD FOR 5 DAYS BEFORE ISSUING A FINAL NEGATIVE REPORT   Report Status PENDING   Incomplete  CULTURE, BLOOD (ROUTINE X 2)     Status: None   Collection Time    06/11/12  8:30 AM      Result Value Range Status   Specimen Description BLOOD RIGHT HAND   Final   Special Requests BOTTLES DRAWN AEROBIC AND ANAEROBIC 10CC EACH   Final   Culture  Setup Time 06/11/2012 12:32   Final   Culture     Final   Value:        BLOOD CULTURE RECEIVED NO GROWTH TO DATE CULTURE WILL BE HELD FOR 5 DAYS BEFORE ISSUING A FINAL NEGATIVE REPORT   Report Status PENDING   Incomplete     Studies: Dg Abd 1 View  06/11/2012  *RADIOLOGY REPORT*  Clinical Data: Abdominal pain  ABDOMEN - 1 VIEW  Comparison: 05/11/2012 CT  Findings: The bowel gas pattern is non-obstructive. Organ outlines are normal where seen. No acute or aggressive osseous abnormality identified.  Hemidiaphragms excluded from the image.  IMPRESSION: Nonobstructive bowel gas pattern.   Original Report Authenticated By: Jearld Lesch, M.D.    US Abdomen Complete  06/11/2012  *RADIOLOGY REPORT*  Clinical Data:  Elevated LFTs, abdominal pain.  Hepatitis C.  COMPLETE ABDOMINAL ULTRASOUND  Comparison:  05/11/2012 CT  Findings:  Gallbladder:  No gallstones, gallbladder wall thickening, or pericholecystic fluid.  Common bile duct:  Normal diameter at 4 mm.  Liver:  Heterogeneous in echogenicity with prominent portal triads. There is a 1.4 x 1.2 x 1.0 cm hyperechoic focus, corresponding to the hypodensity seen on recent CT.  IVC:  Appears normal.  Pancreas:  Poorly visualized due to limited acoustic windows.  No focal abnormality within the neck.  The  head, body, and tail are partially obscured.  Spleen:  Measures 10 cm.  Within normal limits.  Right Kidney:  Measures 11 cm.  No hydronephrosis or focal abnormality.  Left Kidney:  Measures 12 cm.  No hydronephrosis or focal abnormality.  Abdominal aorta:  No aneurysm identified.  IMPRESSION: 1.4 cm hyperechoic focus within the liver is favored to  reflect a hemangioma. Given the stated history of hepatitis C, recommend this be confirmed with a non emergent MRI follow-up.  Heterogeneous hepatic echogenicity with prominence of the portal triads, suggests underlying hepatitis.   Original Report Authenticated By: Jearld Lesch, M.D.    Mr 3d Recon At Scanner  06/11/2012  *RADIOLOGY REPORT*  Clinical Data:  Abdominal pain with elevated liver function studies.  History of hepatitis C and pancreatitis.  MRI ABDOMEN WITHOUT AND WITH CONTRAST (INCLUDING MRCP)  Technique:  Multiplanar multisequence MR imaging of the abdomen was performed both before and after the administration of intravenous contrast. Heavily T2-weighted images of the biliary and pancreatic ducts were obtained, and three-dimensional MRCP images were rendered by post processing.  Contrast: 19mL MULTIHANCE GADOBENATE DIMEGLUMINE 529 MG/ML IV SOLN  Comparison:  Abdominal ultrasound 06/11/2012.  Abdominal CT 05/11/2012.  Findings:  Some of the thin section MRCP and postcontrast images are motion degraded.  There is no evidence of biliary dilatation. There is no evidence of cholelithiasis or choledocholithiasis.  The gallbladder appears normal.  The pancreatic duct is not dilated.  There is no evidence of pancreas divisum.  The pancreas appears normal without surrounding edema or fluid collection.  The pancreas enhances normally following contrast.  As demonstrated on the recent studies, there is a 1 cm well circumscribed lesion centrally in the right hepatic lobe which demonstrates low T1 and high T2 signal.  There is another similar lesion in the medial  segment the left hepatic lobe which measures 1.3 cm in diameter.  The lesion in the right hepatic lobe demonstrates peripheral discontinuous enhancement typical of a hemangioma.  The lesion is not fully opacified on delayed imaging. The lesion in the left hepatic lobe demonstrates earlier discontinuous peripheral enhancement and is fully opacified on delayed imaging.  There are no suspicious liver lesions.  The hepatic contours are normal.  The spleen, gallbladder, adrenal glands and kidneys appear normal. Small lymph nodes within the porta hepatis are nonspecific.  IMPRESSION:  1.  Negative MRCP.  No evidence of biliary dilatation or choledocholithiasis. 2.  No evidence of complicated pancreatitis. 3.  There are two small liver lesions with imaging characteristics typical of hemangiomas.  No suspicious liver lesions identified. 4.  Nonspecific lymph nodes in the porta hepatis, possibly related to reported hepatitis C.   Original Report Authenticated By: Carey Bullocks, M.D.    Mr Abd W/wo Cm/mrcp  06/11/2012  *RADIOLOGY REPORT*  Clinical Data:  Abdominal pain with elevated liver function studies.  History of hepatitis C and pancreatitis.  MRI ABDOMEN WITHOUT AND WITH CONTRAST (INCLUDING MRCP)  Technique:  Multiplanar multisequence MR imaging of the abdomen was performed both before and after the administration of intravenous contrast. Heavily T2-weighted images of the biliary and pancreatic ducts were obtained, and three-dimensional MRCP images were rendered by post processing.  Contrast: 19mL MULTIHANCE GADOBENATE DIMEGLUMINE 529 MG/ML IV SOLN  Comparison:  Abdominal ultrasound 06/11/2012.  Abdominal CT 05/11/2012.  Findings:  Some of the thin section MRCP and postcontrast images are motion degraded.  There is no evidence of biliary dilatation. There is no evidence of cholelithiasis or choledocholithiasis.  The gallbladder appears normal.  The pancreatic duct is not dilated.  There is no evidence of pancreas  divisum.  The pancreas appears normal without surrounding edema or fluid collection.  The pancreas enhances normally following contrast.  As demonstrated on the recent studies, there is a 1 cm well circumscribed lesion centrally in the right hepatic lobe which demonstrates low  T1 and high T2 signal.  There is another similar lesion in the medial segment the left hepatic lobe which measures 1.3 cm in diameter.  The lesion in the right hepatic lobe demonstrates peripheral discontinuous enhancement typical of a hemangioma.  The lesion is not fully opacified on delayed imaging. The lesion in the left hepatic lobe demonstrates earlier discontinuous peripheral enhancement and is fully opacified on delayed imaging.  There are no suspicious liver lesions.  The hepatic contours are normal.  The spleen, gallbladder, adrenal glands and kidneys appear normal. Small lymph nodes within the porta hepatis are nonspecific.  IMPRESSION:  1.  Negative MRCP.  No evidence of biliary dilatation or choledocholithiasis. 2.  No evidence of complicated pancreatitis. 3.  There are two small liver lesions with imaging characteristics typical of hemangiomas.  No suspicious liver lesions identified. 4.  Nonspecific lymph nodes in the porta hepatis, possibly related to reported hepatitis C.   Original Report Authenticated By: Carey Bullocks, M.D.     Scheduled Meds: . pantoprazole (PROTONIX) IV  40 mg Intravenous Q24H  . sodium chloride  3 mL Intravenous Q12H   Continuous Infusions: . sodium chloride 150 mL/hr (06/12/12 1216)    Active Problems:   Elevated LFTs   Abdominal pain   History of pancreatitis   Gastritis   Tobacco abuse   Leukocytosis, unspecified   Cannabis abuse    Time spent: 30 min    SHORT, [facility]  [facility] Pager [phone]. If 7PM-7AM, please contact night-coverage at [email or web address], password [facility] 06/12/2012, 3:24 PM  LOS: 2 days

## 2012-06-12 NOTE — Progress Notes (Signed)
Subjective: Patient complains of excruciating abdominal pain; very angry at the moment about not getting adequate pain control.  Objective: Vital signs in last 24 hours: Temp:  [98 F (36.7 C)-98.2 F (36.8 C)] 98.2 F (36.8 C) (03/12 1418) Pulse Rate:  [60-80] 80 (03/12 1418) Resp:  [16] 16 (03/12 1418) BP: (116-138)/(65-82) 138/82 mmHg (03/12 1418) SpO2:  [97 %-100 %] 100 % (03/12 1418) Weight:  [91.627 kg (202 lb)] 91.627 kg (202 lb) (03/12 1514) Last BM Date: 06/10/12  Intake/Output from previous day:   Intake/Output this shift:   Resp: clear to auscultation bilaterally Cardio: regular rate and rhythm, S1, S2 normal, no murmur, click, rub or gallop GI: soft, diffusely tender; decreased bowel sounds normal; no masses,  no organomegaly  Lab Results:  Recent Labs  06/11/12 0010 06/11/12 0640 06/12/12 0515  WBC 17.3* 13.8* 8.8  HGB 15.5 12.8* 11.9*  HCT 44.3 37.7* 36.7*  PLT 252 188 191   BMET  Recent Labs  06/11/12 0010 06/11/12 0640 06/12/12 0515  NA 137 136 139  K 3.7 4.2 4.0  CL 99 101 105  CO2 22 23 28   GLUCOSE 141* 119* 103*  BUN 17 17 13   CREATININE 0.81 0.85 0.93  CALCIUM 10.4 8.9 8.6   LFT  Recent Labs  06/11/12 0640 06/12/12 0515  PROT 7.1 6.3  ALBUMIN 3.5 3.1*  AST 393* 295*  ALT 986* 768*  ALKPHOS 199* 159*  BILITOT 1.5* 1.2  BILIDIR 0.8*  --   IBILI 0.7  --    PT/INR  Recent Labs  06/11/12 0240 06/11/12 0640  LABPROT 12.4 12.9  INR 0.93 0.98   Hepatitis Panel  Recent Labs  06/11/12 0240  HEPBSAG NEGATIVE  HCVAB Reactive*  HEPAIGM NEGATIVE  HEPBIGM NEGATIVE   Studies/Results: Dg Abd 1 View  06/11/2012  *RADIOLOGY REPORT*  Clinical Data: Abdominal pain  ABDOMEN - 1 VIEW  Comparison: 05/11/2012 CT  Findings: The bowel gas pattern is non-obstructive. Organ outlines are normal where seen. No acute or aggressive osseous abnormality identified.  Hemidiaphragms excluded from the image.  IMPRESSION: Nonobstructive bowel gas  pattern.   Original Report Authenticated By: Jearld Lesch, M.D.    US Abdomen Complete  06/11/2012  *RADIOLOGY REPORT*  Clinical Data:  Elevated LFTs, abdominal pain.  Hepatitis C.  COMPLETE ABDOMINAL ULTRASOUND  Comparison:  05/11/2012 CT  Findings:  Gallbladder:  No gallstones, gallbladder wall thickening, or pericholecystic fluid.  Common bile duct:  Normal diameter at 4 mm.  Liver:  Heterogeneous in echogenicity with prominent portal triads. There is a 1.4 x 1.2 x 1.0 cm hyperechoic focus, corresponding to the hypodensity seen on recent CT.  IVC:  Appears normal.  Pancreas:  Poorly visualized due to limited acoustic windows.  No focal abnormality within the neck.  The head, body, and tail are partially obscured.  Spleen:  Measures 10 cm.  Within normal limits.  Right Kidney:  Measures 11 cm.  No hydronephrosis or focal abnormality.  Left Kidney:  Measures 12 cm.  No hydronephrosis or focal abnormality.  Abdominal aorta:  No aneurysm identified.  IMPRESSION: 1.4 cm hyperechoic focus within the liver is favored to reflect a hemangioma. Given the stated history of hepatitis C, recommend this be confirmed with a non emergent MRI follow-up.  Heterogeneous hepatic echogenicity with prominence of the portal triads, suggests underlying hepatitis.   Original Report Authenticated By: Jearld Lesch, M.D.    Mr 3d Recon At Scanner  06/11/2012  *RADIOLOGY REPORT*  Clinical  Data:  Abdominal pain with elevated liver function studies.  History of hepatitis C and pancreatitis.  MRI ABDOMEN WITHOUT AND WITH CONTRAST (INCLUDING MRCP)  Technique:  Multiplanar multisequence MR imaging of the abdomen was performed both before and after the administration of intravenous contrast. Heavily T2-weighted images of the biliary and pancreatic ducts were obtained, and three-dimensional MRCP images were rendered by post processing.  Contrast: 19mL MULTIHANCE GADOBENATE DIMEGLUMINE 529 MG/ML IV SOLN  Comparison:  Abdominal  ultrasound 06/11/2012.  Abdominal CT 05/11/2012.  Findings:  Some of the thin section MRCP and postcontrast images are motion degraded.  There is no evidence of biliary dilatation. There is no evidence of cholelithiasis or choledocholithiasis.  The gallbladder appears normal.  The pancreatic duct is not dilated.  There is no evidence of pancreas divisum.  The pancreas appears normal without surrounding edema or fluid collection.  The pancreas enhances normally following contrast.  As demonstrated on the recent studies, there is a 1 cm well circumscribed lesion centrally in the right hepatic lobe which demonstrates low T1 and high T2 signal.  There is another similar lesion in the medial segment the left hepatic lobe which measures 1.3 cm in diameter.  The lesion in the right hepatic lobe demonstrates peripheral discontinuous enhancement typical of a hemangioma.  The lesion is not fully opacified on delayed imaging. The lesion in the left hepatic lobe demonstrates earlier discontinuous peripheral enhancement and is fully opacified on delayed imaging.  There are no suspicious liver lesions.  The hepatic contours are normal.  The spleen, gallbladder, adrenal glands and kidneys appear normal. Small lymph nodes within the porta hepatis are nonspecific.  IMPRESSION:  1.  Negative MRCP.  No evidence of biliary dilatation or choledocholithiasis. 2.  No evidence of complicated pancreatitis. 3.  There are two small liver lesions with imaging characteristics typical of hemangiomas.  No suspicious liver lesions identified. 4.  Nonspecific lymph nodes in the porta hepatis, possibly related to reported hepatitis C.   Original Report Authenticated By: Carey Bullocks, M.D.    Mr Abd W/wo Cm/mrcp  06/11/2012  *RADIOLOGY REPORT*  Clinical Data:  Abdominal pain with elevated liver function studies.  History of hepatitis C and pancreatitis.  MRI ABDOMEN WITHOUT AND WITH CONTRAST (INCLUDING MRCP)  Technique:  Multiplanar  multisequence MR imaging of the abdomen was performed both before and after the administration of intravenous contrast. Heavily T2-weighted images of the biliary and pancreatic ducts were obtained, and three-dimensional MRCP images were rendered by post processing.  Contrast: 19mL MULTIHANCE GADOBENATE DIMEGLUMINE 529 MG/ML IV SOLN  Comparison:  Abdominal ultrasound 06/11/2012.  Abdominal CT 05/11/2012.  Findings:  Some of the thin section MRCP and postcontrast images are motion degraded.  There is no evidence of biliary dilatation. There is no evidence of cholelithiasis or choledocholithiasis.  The gallbladder appears normal.  The pancreatic duct is not dilated.  There is no evidence of pancreas divisum.  The pancreas appears normal without surrounding edema or fluid collection.  The pancreas enhances normally following contrast.  As demonstrated on the recent studies, there is a 1 cm well circumscribed lesion centrally in the right hepatic lobe which demonstrates low T1 and high T2 signal.  There is another similar lesion in the medial segment the left hepatic lobe which measures 1.3 cm in diameter.  The lesion in the right hepatic lobe demonstrates peripheral discontinuous enhancement typical of a hemangioma.  The lesion is not fully opacified on delayed imaging. The lesion in the left hepatic lobe  demonstrates earlier discontinuous peripheral enhancement and is fully opacified on delayed imaging.  There are no suspicious liver lesions.  The hepatic contours are normal.  The spleen, gallbladder, adrenal glands and kidneys appear normal. Small lymph nodes within the porta hepatis are nonspecific.  IMPRESSION:  1.  Negative MRCP.  No evidence of biliary dilatation or choledocholithiasis. 2.  No evidence of complicated pancreatitis. 3.  There are two small liver lesions with imaging characteristics typical of hemangiomas.  No suspicious liver lesions identified. 4.  Nonspecific lymph nodes in the porta hepatis,  possibly related to reported hepatitis C.   Original Report Authenticated By: Carey Bullocks, M.D.     Medications: I have reviewed the patient's current medications.  Assessment/Plan: 1) Acute abdominal pain with abnormal LFT's: etiology unclear. WBC count, Lipase and MRCP normal. LFT's are gradually improving. All the labs so far have been negative.  2) Hepatitis C followed at [facility] as per the patient.  3) ?Drug seeking behavior.    LOS: 2 days   MANN,JYOTHI 06/12/2012, 7:25 PM

## 2012-06-12 NOTE — Progress Notes (Signed)
ANTICOAGULATION CONSULT NOTE - Initial Consult  Pharmacy Consult for Lovenox Indication: VTE prophylaxis  No Known Allergies  Patient Measurements: Height: 6\' 2"  (188 cm) Weight: 202 lb (91.627 kg) IBW/kg (Calculated) : 82.2   Vital Signs: Temp: 98.2 F (36.8 C) (03/12 1418) Temp src: Oral (03/12 1418) BP: 138/82 mmHg (03/12 1418) Pulse Rate: 80 (03/12 1418)  Labs:  Recent Labs  06/11/12 0010 06/11/12 0240 06/11/12 0640 06/12/12 0515  HGB 15.5  --  12.8* 11.9*  HCT 44.3  --  37.7* 36.7*  PLT 252  --  188 191  LABPROT  --  12.4 12.9  --   INR  --  0.93 0.98  --   CREATININE 0.81  --  0.85 0.93  TROPONINI  --  <0.30  --   --     Estimated Creatinine Clearance: 137.5 ml/min (by C-G formula based on Cr of 0.93).   Medical History: Past Medical History  Diagnosis Date  . Pancreatitis   . Hepatitis C     Assessment: 81 yoM with transaminasemia/abdominal pain/intractible vomiting with known HCV and PMHx pertinent for pancreatitis and polysubstance abuse. Pharmacy to dose prophylactic lovenox.    Wt 91.6, Ht 6'2", BMI 26  Renal: SCr ok, CrCl >100  CBC: Hgb fell 15.5-->11.9, plts ok.  No bleeding noted.  No hepatic dysfunction dosing   Goal of Therapy:  Anti-Xa level 0.6-1.2 units/ml 4hrs after LMWH dose given Monitor platelets by anticoagulation protocol: Yes   Plan:  1.  Lovenox 40mg  SQ q 24 hours 2.  F/u CBC, renal fxn   Pharmacy will sign-off and follow peripherally.  Please re-consult if needed. Thanks!  Haynes Hoehn, PharmD 06/12/2012 4:12 PM  Pager: [phone]

## 2012-06-13 DIAGNOSIS — F17200 Nicotine dependence, unspecified, uncomplicated: Secondary | ICD-10-CM

## 2012-06-13 LAB — CBC
HCT: 35.5 % — ABNORMAL LOW (ref 39.0–52.0)
Hemoglobin: 11.8 g/dL — ABNORMAL LOW (ref 13.0–17.0)
MCHC: 33.2 g/dL (ref 30.0–36.0)
WBC: 5.8 10*3/uL (ref 4.0–10.5)

## 2012-06-13 LAB — COMPREHENSIVE METABOLIC PANEL
ALT: 552 U/L — ABNORMAL HIGH (ref 0–53)
Albumin: 3.2 g/dL — ABNORMAL LOW (ref 3.5–5.2)
BUN: 6 mg/dL (ref 6–23)
CO2: 30 mEq/L (ref 19–32)
Chloride: 101 mEq/L (ref 96–112)
Creatinine, Ser: 0.89 mg/dL (ref 0.50–1.35)
GFR calc Af Amer: >90 mL/min (ref >90)
Potassium: 3.5 mEq/L (ref 3.5–5.1)
Total Protein: 6.6 g/dL (ref 6.0–8.3)

## 2012-06-13 LAB — GLUCOSE, CAPILLARY

## 2012-06-13 MED ORDER — DIPHENHYDRAMINE HCL 25 MG PO CAPS
25.0000 mg | ORAL_CAPSULE | ORAL | Status: DC | PRN
  Administered 2012-06-13 – 2012-06-14 (×3): 25 mg via ORAL
  Filled 2012-06-13 (×3): qty 1

## 2012-06-13 MED ORDER — LORAZEPAM 2 MG/ML IJ SOLN: 2.0000 mg | Freq: Once | INTRAMUSCULAR | Status: DC

## 2012-06-13 MED ORDER — HYDROMORPHONE HCL PF 2 MG/ML IJ SOLN
2.0000 mg | INTRAMUSCULAR | Status: DC | PRN
  Administered 2012-06-13 – 2012-06-14 (×3): 2 mg via INTRAVENOUS
  Filled 2012-06-13 (×4): qty 1

## 2012-06-13 MED ORDER — LORAZEPAM 2 MG/ML IJ SOLN
INTRAMUSCULAR | Status: AC
  Administered 2012-06-13: 15:00:00
  Filled 2012-06-13: qty 1

## 2012-06-13 NOTE — Progress Notes (Signed)
Went into pt room to check on him.  He is tearful and apologizes freq for early behavior.  He asked if I would call the Dr and tell her he wanted to speak with her to say he was sorry.  Text page sent to Dr Malachi Bonds.

## 2012-06-13 NOTE — Progress Notes (Signed)
Charge nurse heard disturbance coming from desk. Upon looking up hallway patient from 1502, Albert Peters, noted to be yelling out in the hall about someone treating him like a child.  Patient had been noted at the desk a few minutes before this incident speaking loudly to MD who was on the phone about not increasing his pain medication.  Due to patient yelling in the hallway and appearing to be out of control security was called.  Social worker spoke with patient and security arrived.

## 2012-06-13 NOTE — Progress Notes (Addendum)
[facility] PROGRESS NOTE  Albert Peters HYQ:[id number] DOB: 15-Jan-1986 DOA: 06/10/2012 PCP: No primary provider on file.  Assessment/Plan  Transaminasemia/abdominal pain/intractible vomiting improving.  AMA was positive, but per GI, PBC is usually not associated with severe transaminitis.  Patient has been ambulating up and down the halls, going off the floor despite complaining of intractable pain.  Has been drinking his fluids without nausea or vomiting -  Follow up with [facility] at discharge.   -  HCV positive (known diagnosis) -  Hep B SAg neg -  Hep A pending  - ANA neg, Viral hepatitis panel, HIV NR, ESR 3, lipid panel wnl - Check ceruloplasmin 22 wnl - Check alpha-1 antitrypsin wnl, AMA positive, ASMA 6 wnl -  urine drug screen positive for opiates and THC  -  Abdominal ultrasound shows heterogenous liver with prominent portal triads, 1.4 x 1.2cm hypoechoic focus in the liver, normal gallbladder  -  MRCP negative.  Two small liver lesions c/w hemangiomas -  Increase to BID PPI -  Advance to low fat diet -  Change to oxycodone for pain and space IV dilaudid to q4h for now -  Await AM labs.    Polysubstance abuse  -05/11/2012 UDS--positive for THC, barbituates  -Denies any alcohol use in over one year, but abused in the past  -Denies use of any supplements from nutritional health stores  -last used cannibis 06/10/12   Diet:  Advance to full liquid  Access:  PIV IVF:  NS at 118ml/h Proph:  lovenox  Code Status: full Family Communication: spoke with patient alone Disposition Plan: pending tolerating diet   Consultants:  GI  Procedures:  MRCP  Antibiotics:  none   HPI/Subjective:  Denies fevers, chills, headache.  Denies chest pain and shortness of breath, nausea and vomiting.  Still has epigastric  Pain 7/10.  Denies const, diarrhea, but no BMs as not eating.   Has been drinking well.  Has been out of bed.      Objective: Filed Vitals:   06/12/12 1418  06/12/12 1514 06/12/12 2200 06/13/12 0653  BP: 138/82  140/83 143/82  Pulse: 80  65 67  Temp: 98.2 F (36.8 C)  98 F (36.7 C) 98 F (36.7 C)  TempSrc: Oral  Oral Oral  Resp: 16  18 18   Height:      Weight:  91.627 kg (202 lb)  93.804 kg (206 lb 12.8 oz)  SpO2: 100%  99% 98%    Intake/Output Summary (Last 24 hours) at 06/13/12 1104 Last data filed at 06/13/12 0400  Gross per 24 hour  Intake 3592.5 ml  Output      0 ml  Net 3592.5 ml   Filed Weights   06/11/12 0515 06/12/12 1514 06/13/12 0653  Weight: 92.035 kg (202 lb 14.4 oz) 91.627 kg (202 lb) 93.804 kg (206 lb 12.8 oz)    Exam:   General:  Avg wt AAM, No acute distress. Previously ambulating hall, but currently resting in bed.  HEENT:  NCAT, MMM  Cardiovascular:  RRR, nl S1, S2 no mrg, 2+ pulses, warm extremities  Respiratory:  CTAB, no increased WOB  Abdomen:   NABS, soft, mildly TTP in the epigastric region, ND  MSK:   Normal tone and bulk, no LEE  Neuro:  Grossly intact  Data Reviewed: Basic Metabolic Panel:  Recent Labs Lab 06/11/12 0010 06/11/12 0640 06/12/12 0515  NA 137 136 139  K 3.7 4.2 4.0  CL 99 101 105  CO2  22 23 28   GLUCOSE 141* 119* 103*  BUN 17 17 13   CREATININE 0.81 0.85 0.93  CALCIUM 10.4 8.9 8.6   Liver Function Tests:  Recent Labs Lab July 06, 2012 0010 07-06-12 0640 06/12/12 0515  AST 647* 393* 295*  ALT 1398* 986* 768*  ALKPHOS 267* 199* 159*  BILITOT 2.1* 1.5* 1.2  PROT 9.2* 7.1 6.3  ALBUMIN 4.7 3.5 3.1*    Recent Labs Lab Jul 06, 2012 0010  LIPASE 14   No results found for this basename: AMMONIA,  in the last 168 hours CBC:  Recent Labs Lab 07-06-2012 0010 07-06-2012 0640 06/12/12 0515  WBC 17.3* 13.8* 8.8  NEUTROABS 14.8* 10.7* 3.8  HGB 15.5 12.8* 11.9*  HCT 44.3 37.7* 36.7*  MCV 89.3 90.2 92.2  PLT 252 188 191   Cardiac Enzymes:  Recent Labs Lab 07/06/2012 0240  TROPONINI <0.30   BNP (last 3 results) No results found for this basename: PROBNP,  in the  last 8760 hours CBG:  Recent Labs Lab 06/12/12 0625 06/12/12 1329 06/12/12 1808 06/13/12 0014 06/13/12 0709  GLUCAP 109* 131* 91 83 94    Recent Results (from the past 240 hour(s))  CULTURE, BLOOD (ROUTINE X 2)     Status: None   Collection Time    07/06/12  8:30 AM      Result Value Range Status   Specimen Description BLOOD LEFT ANTECUBITAL   Final   Special Requests BOTTLES DRAWN AEROBIC AND ANAEROBIC 5CC EACH   Final   Culture  Setup Time 06-Jul-2012 12:32   Final   Culture     Final   Value:        BLOOD CULTURE RECEIVED NO GROWTH TO DATE CULTURE WILL BE HELD FOR 5 DAYS BEFORE ISSUING A FINAL NEGATIVE REPORT   Report Status PENDING   Incomplete  CULTURE, BLOOD (ROUTINE X 2)     Status: None   Collection Time    2012/07/06  8:30 AM      Result Value Range Status   Specimen Description BLOOD RIGHT HAND   Final   Special Requests BOTTLES DRAWN AEROBIC AND ANAEROBIC 10CC EACH   Final   Culture  Setup Time 2012-07-06 12:32   Final   Culture     Final   Value:        BLOOD CULTURE RECEIVED NO GROWTH TO DATE CULTURE WILL BE HELD FOR 5 DAYS BEFORE ISSUING A FINAL NEGATIVE REPORT   Report Status PENDING   Incomplete     Studies: Mr 3d Recon At Scanner  07/06/2012  *RADIOLOGY REPORT*  Clinical Data:  Abdominal pain with elevated liver function studies.  History of hepatitis C and pancreatitis.  MRI ABDOMEN WITHOUT AND WITH CONTRAST (INCLUDING MRCP)  Technique:  Multiplanar multisequence MR imaging of the abdomen was performed both before and after the administration of intravenous contrast. Heavily T2-weighted images of the biliary and pancreatic ducts were obtained, and three-dimensional MRCP images were rendered by post processing.  Contrast: 19mL MULTIHANCE GADOBENATE DIMEGLUMINE 529 MG/ML IV SOLN  Comparison:  Abdominal ultrasound 06-Jul-2012.  Abdominal CT 05/11/2012.  Findings:  Some of the thin section MRCP and postcontrast images are motion degraded.  There is no evidence of  biliary dilatation. There is no evidence of cholelithiasis or choledocholithiasis.  The gallbladder appears normal.  The pancreatic duct is not dilated.  There is no evidence of pancreas divisum.  The pancreas appears normal without surrounding edema or fluid collection.  The pancreas enhances normally following contrast.  As demonstrated on the recent studies, there is a 1 cm well circumscribed lesion centrally in the right hepatic lobe which demonstrates low T1 and high T2 signal.  There is another similar lesion in the medial segment the left hepatic lobe which measures 1.3 cm in diameter.  The lesion in the right hepatic lobe demonstrates peripheral discontinuous enhancement typical of a hemangioma.  The lesion is not fully opacified on delayed imaging. The lesion in the left hepatic lobe demonstrates earlier discontinuous peripheral enhancement and is fully opacified on delayed imaging.  There are no suspicious liver lesions.  The hepatic contours are normal.  The spleen, gallbladder, adrenal glands and kidneys appear normal. Small lymph nodes within the porta hepatis are nonspecific.  IMPRESSION:  1.  Negative MRCP.  No evidence of biliary dilatation or choledocholithiasis. 2.  No evidence of complicated pancreatitis. 3.  There are two small liver lesions with imaging characteristics typical of hemangiomas.  No suspicious liver lesions identified. 4.  Nonspecific lymph nodes in the porta hepatis, possibly related to reported hepatitis C.   Original Report Authenticated By: Carey Bullocks, M.D.    Mr Abd W/wo Cm/mrcp  06/11/2012  *RADIOLOGY REPORT*  Clinical Data:  Abdominal pain with elevated liver function studies.  History of hepatitis C and pancreatitis.  MRI ABDOMEN WITHOUT AND WITH CONTRAST (INCLUDING MRCP)  Technique:  Multiplanar multisequence MR imaging of the abdomen was performed both before and after the administration of intravenous contrast. Heavily T2-weighted images of the biliary and  pancreatic ducts were obtained, and three-dimensional MRCP images were rendered by post processing.  Contrast: 19mL MULTIHANCE GADOBENATE DIMEGLUMINE 529 MG/ML IV SOLN  Comparison:  Abdominal ultrasound 06/11/2012.  Abdominal CT 05/11/2012.  Findings:  Some of the thin section MRCP and postcontrast images are motion degraded.  There is no evidence of biliary dilatation. There is no evidence of cholelithiasis or choledocholithiasis.  The gallbladder appears normal.  The pancreatic duct is not dilated.  There is no evidence of pancreas divisum.  The pancreas appears normal without surrounding edema or fluid collection.  The pancreas enhances normally following contrast.  As demonstrated on the recent studies, there is a 1 cm well circumscribed lesion centrally in the right hepatic lobe which demonstrates low T1 and high T2 signal.  There is another similar lesion in the medial segment the left hepatic lobe which measures 1.3 cm in diameter.  The lesion in the right hepatic lobe demonstrates peripheral discontinuous enhancement typical of a hemangioma.  The lesion is not fully opacified on delayed imaging. The lesion in the left hepatic lobe demonstrates earlier discontinuous peripheral enhancement and is fully opacified on delayed imaging.  There are no suspicious liver lesions.  The hepatic contours are normal.  The spleen, gallbladder, adrenal glands and kidneys appear normal. Small lymph nodes within the porta hepatis are nonspecific.  IMPRESSION:  1.  Negative MRCP.  No evidence of biliary dilatation or choledocholithiasis. 2.  No evidence of complicated pancreatitis. 3.  There are two small liver lesions with imaging characteristics typical of hemangiomas.  No suspicious liver lesions identified. 4.  Nonspecific lymph nodes in the porta hepatis, possibly related to reported hepatitis C.   Original Report Authenticated By: Carey Bullocks, M.D.     Scheduled Meds: . enoxaparin (LOVENOX) injection  40 mg  Subcutaneous Q24H  . pantoprazole (PROTONIX) IV  40 mg Intravenous Q12H  . sodium chloride  3 mL Intravenous Q12H  . sucralfate  1 g Oral TID WC & HS  Continuous Infusions: . sodium chloride 150 mL/hr at 06/13/12 1610    Active Problems:   Elevated LFTs   Abdominal pain   History of pancreatitis   Gastritis   Tobacco abuse   Cannabis abuse    Time spent: 30 min    SHORT, [facility]  [facility] Pager [phone]. If 7PM-7AM, please contact night-coverage at [email or web address], password [facility] 06/13/2012, 11:04 AM  LOS: 3 days

## 2012-06-13 NOTE — Progress Notes (Signed)
Pt requesting a change in prn pain meds , asking if we can change it from every q 4 to q3 hours prn. Text page sent to on call Dr.

## 2012-06-13 NOTE — Progress Notes (Signed)
Pt noted to be at the desk yelling and cursing at  Dr. Malachi Bonds. Security was called.  Pt walked back to his room by sw and this nurse.  He expressed the need to have something for pain.

## 2012-06-13 NOTE — Progress Notes (Signed)
Subjective: Still with abdominal pain, but it is tolerable at this time.  Objective: Vital signs in last 24 hours: Temp:  [98 F (36.7 C)-98.2 F (36.8 C)] 98 F (36.7 C) (03/13 0653) Pulse Rate:  [65-80] 67 (03/13 0653) Resp:  [16-18] 18 (03/13 0653) BP: (138-143)/(82-83) 143/82 mmHg (03/13 0653) SpO2:  [98 %-100 %] 98 % (03/13 0653) Weight:  [202 lb (91.627 kg)-206 lb 12.8 oz (93.804 kg)] 206 lb 12.8 oz (93.804 kg) (03/13 0653) Last BM Date: 06/10/12  Intake/Output from previous day: 03/12 0701 - 03/13 0700 In: 3592.5 [P.O.:240; I.V.:3352.5] Out: -  Intake/Output this shift:    General appearance: alert and no distress GI: soft, non-tender; bowel sounds normal; no masses,  no organomegaly  Lab Results:  Recent Labs  06/11/12 0010 06/11/12 0640 06/12/12 0515  WBC 17.3* 13.8* 8.8  HGB 15.5 12.8* 11.9*  HCT 44.3 37.7* 36.7*  PLT 252 188 191   BMET  Recent Labs  06/11/12 0010 06/11/12 0640 06/12/12 0515  NA 137 136 139  K 3.7 4.2 4.0  CL 99 101 105  CO2 22 23 28   GLUCOSE 141* 119* 103*  BUN 17 17 13   CREATININE 0.81 0.85 0.93  CALCIUM 10.4 8.9 8.6   LFT  Recent Labs  06/11/12 0640 06/12/12 0515  PROT 7.1 6.3  ALBUMIN 3.5 3.1*  AST 393* 295*  ALT 986* 768*  ALKPHOS 199* 159*  BILITOT 1.5* 1.2  BILIDIR 0.8*  --   IBILI 0.7  --    PT/INR  Recent Labs  06/11/12 0240 06/11/12 0640  LABPROT 12.4 12.9  INR 0.93 0.98   Hepatitis Panel  Recent Labs  06/11/12 0240  HEPBSAG NEGATIVE  HCVAB Reactive*  HEPAIGM NEGATIVE  HEPBIGM NEGATIVE   C-Diff No results found for this basename: CDIFFTOX,  in the last 72 hours Fecal Lactopherrin No results found for this basename: FECLLACTOFRN,  in the last 72 hours  Studies/Results: Mr 3d Recon At Scanner  06/11/2012  *RADIOLOGY REPORT*  Clinical Data:  Abdominal pain with elevated liver function studies.  History of hepatitis C and pancreatitis.  MRI ABDOMEN WITHOUT AND WITH CONTRAST (INCLUDING MRCP)   Technique:  Multiplanar multisequence MR imaging of the abdomen was performed both before and after the administration of intravenous contrast. Heavily T2-weighted images of the biliary and pancreatic ducts were obtained, and three-dimensional MRCP images were rendered by post processing.  Contrast: 19mL MULTIHANCE GADOBENATE DIMEGLUMINE 529 MG/ML IV SOLN  Comparison:  Abdominal ultrasound 06/11/2012.  Abdominal CT 05/11/2012.  Findings:  Some of the thin section MRCP and postcontrast images are motion degraded.  There is no evidence of biliary dilatation. There is no evidence of cholelithiasis or choledocholithiasis.  The gallbladder appears normal.  The pancreatic duct is not dilated.  There is no evidence of pancreas divisum.  The pancreas appears normal without surrounding edema or fluid collection.  The pancreas enhances normally following contrast.  As demonstrated on the recent studies, there is a 1 cm well circumscribed lesion centrally in the right hepatic lobe which demonstrates low T1 and high T2 signal.  There is another similar lesion in the medial segment the left hepatic lobe which measures 1.3 cm in diameter.  The lesion in the right hepatic lobe demonstrates peripheral discontinuous enhancement typical of a hemangioma.  The lesion is not fully opacified on delayed imaging. The lesion in the left hepatic lobe demonstrates earlier discontinuous peripheral enhancement and is fully opacified on delayed imaging.  There are no suspicious  liver lesions.  The hepatic contours are normal.  The spleen, gallbladder, adrenal glands and kidneys appear normal. Small lymph nodes within the porta hepatis are nonspecific.  IMPRESSION:  1.  Negative MRCP.  No evidence of biliary dilatation or choledocholithiasis. 2.  No evidence of complicated pancreatitis. 3.  There are two small liver lesions with imaging characteristics typical of hemangiomas.  No suspicious liver lesions identified. 4.  Nonspecific lymph nodes  in the porta hepatis, possibly related to reported hepatitis C.   Original Report Authenticated By: Carey Bullocks, M.D.    Mr Abd W/wo Cm/mrcp  06/11/2012  *RADIOLOGY REPORT*  Clinical Data:  Abdominal pain with elevated liver function studies.  History of hepatitis C and pancreatitis.  MRI ABDOMEN WITHOUT AND WITH CONTRAST (INCLUDING MRCP)  Technique:  Multiplanar multisequence MR imaging of the abdomen was performed both before and after the administration of intravenous contrast. Heavily T2-weighted images of the biliary and pancreatic ducts were obtained, and three-dimensional MRCP images were rendered by post processing.  Contrast: 19mL MULTIHANCE GADOBENATE DIMEGLUMINE 529 MG/ML IV SOLN  Comparison:  Abdominal ultrasound 06/11/2012.  Abdominal CT 05/11/2012.  Findings:  Some of the thin section MRCP and postcontrast images are motion degraded.  There is no evidence of biliary dilatation. There is no evidence of cholelithiasis or choledocholithiasis.  The gallbladder appears normal.  The pancreatic duct is not dilated.  There is no evidence of pancreas divisum.  The pancreas appears normal without surrounding edema or fluid collection.  The pancreas enhances normally following contrast.  As demonstrated on the recent studies, there is a 1 cm well circumscribed lesion centrally in the right hepatic lobe which demonstrates low T1 and high T2 signal.  There is another similar lesion in the medial segment the left hepatic lobe which measures 1.3 cm in diameter.  The lesion in the right hepatic lobe demonstrates peripheral discontinuous enhancement typical of a hemangioma.  The lesion is not fully opacified on delayed imaging. The lesion in the left hepatic lobe demonstrates earlier discontinuous peripheral enhancement and is fully opacified on delayed imaging.  There are no suspicious liver lesions.  The hepatic contours are normal.  The spleen, gallbladder, adrenal glands and kidneys appear normal. Small lymph  nodes within the porta hepatis are nonspecific.  IMPRESSION:  1.  Negative MRCP.  No evidence of biliary dilatation or choledocholithiasis. 2.  No evidence of complicated pancreatitis. 3.  There are two small liver lesions with imaging characteristics typical of hemangiomas.  No suspicious liver lesions identified. 4.  Nonspecific lymph nodes in the porta hepatis, possibly related to reported hepatitis C.   Original Report Authenticated By: Carey Bullocks, M.D.     Medications:  Scheduled: . enoxaparin (LOVENOX) injection  40 mg Subcutaneous Q24H  . pantoprazole (PROTONIX) IV  40 mg Intravenous Q12H  . sodium chloride  3 mL Intravenous Q12H  . sucralfate  1 g Oral TID WC & HS   Continuous: . sodium chloride 150 mL/hr at 06/13/12 0724    Assessment/Plan: 1) Elevated liver enzymes. 2) HCV.   The patient's AMA was positive, but his ANA was negative.  He does have HCV.  His liver panel for this AM is still pending.  I do not know what to make of the AMA value.  I have not recalled a case where PBC caused such high elevations in the liver enzymes.  As for his HCV I know of acute infections that can cause an increase this high, but he already carried  a diagnosis of HCV.  ? Flare of HCV causing the elevated liver enzymes.  He still continues to complain of abdominal pain and desires pain medications, however, I am dubious about his intentions with the pain medications.  Plan: 1) Follow up with liver enzymes. 2) Follow up at [facility].  He states the already has a GI appointment in April.  If he should have another flare he is encouraged to go to [facility] as he is already know to that institution. 3) Tramadol is the most I would recommend.  I told him that the goal was not necessarily to make him pain free, but to be able to manage the pain.     LOS: 3 days   HUNG,PATRICK D 06/13/2012, 7:43 AM

## 2012-06-13 NOTE — Progress Notes (Signed)
Pt mom called and wanted this nurse to call her and give her info about when the pt will be discharged.  Went into the pt  Room and asked if I could call her and give her info.  The pt became upset speaking aloud and said "I'll call her".  After speaking with his mom, he became more upset asked if we could speak with the Dr and get him something for more for pain.  Spoke with Dr Malachi Bonds and informed her of pt request and that pt is upset with his mom.  No new orders at this time.  Informed pt. The pt becomes upset and asked if her could speak with the Dr.  He then walked out to the desk to see the Dr. He became louder, yelling at the Dr.  He then went back to his room.

## 2012-06-14 LAB — COMPREHENSIVE METABOLIC PANEL
ALT: 459 U/L — ABNORMAL HIGH (ref 0–53)
AST: 96 U/L — ABNORMAL HIGH (ref 0–37)
CO2: 30 mEq/L (ref 19–32)
Chloride: 101 mEq/L (ref 96–112)
GFR calc non Af Amer: >90 mL/min (ref >90)
Sodium: 138 mEq/L (ref 135–145)
Total Bilirubin: 0.7 mg/dL (ref 0.3–1.2)

## 2012-06-14 LAB — GLUCOSE, CAPILLARY: Glucose-Capillary: 94 mg/dL (ref 70–99)

## 2012-06-14 LAB — CBC
MCH: 30.6 pg (ref 26.0–34.0)
MCHC: 33.2 g/dL (ref 30.0–36.0)
Platelets: 210 10*3/uL (ref 150–400)

## 2012-06-14 MED ORDER — OXYCODONE HCL 15 MG PO TABS: 15.0000 mg | ORAL_TABLET | ORAL | Status: DC | PRN

## 2012-06-14 MED ORDER — PANTOPRAZOLE SODIUM 40 MG PO TBEC: 40.0000 mg | DELAYED_RELEASE_TABLET | Freq: Every day | ORAL | Status: DC

## 2012-06-14 MED ORDER — HYDROMORPHONE HCL PF 2 MG/ML IJ SOLN
2.0000 mg | Freq: Once | INTRAMUSCULAR | Status: AC
  Administered 2012-06-14: 2 mg via INTRAVENOUS
  Filled 2012-06-14: qty 1

## 2012-06-14 MED ORDER — HEPATITIS B VAC RECOMBINANT 5 MCG/0.5ML IJ SUSP: 0.5000 mL | Freq: Once | INTRAMUSCULAR | Status: DC

## 2012-06-14 MED ORDER — HEPATITIS A VACCINE 1440 EL U/ML IM SUSP: 1.0000 mL | Freq: Once | INTRAMUSCULAR | Status: DC

## 2012-06-14 NOTE — Discharge Summary (Addendum)
Physician Discharge Summary  Albert Peters ION:[id number] DOB: 09-Sep-1985 DOA: 06/10/2012  PCP: No primary provider on file.  Admit date: 06/10/2012 Discharge date: 06/14/2012  Recommendations for Outpatient Follow-up:  1. Follow up with your gastroenterologist within 2-3 weeks of discharge.  Please administer Hep A and B vaccines if not already complete.  Repeat CMP to follow up transaminitis.     Discharge Diagnoses:  Active Problems:   Elevated LFTs   Abdominal pain   History of pancreatitis   Gastritis   Tobacco abuse   Cannabis abuse   Discharge Condition: stable, improved  Diet recommendation: low fat  Wt Readings from Last 3 Encounters:  06/14/12 92.08 kg (203 lb)    History of present illness:   Albert Peters is a 27 y.o. male with previous history of pancreatitis and gastritis presents to the ER because of worsening abdominal pain since last evening 4 PM. The pain is located in the epigastric area severe stabbing in nature. Associated with it as nausea vomiting and had one episode of diarrhea. In the ER patient was given pain relief medications and labs reveal markedly elevated LFTs. Patient had come with similar symptoms last month and at that time CT abdomen and pelvis was unremarkable and his LFTs at that time was also was elevated but now it has doubled to what it was. Patient states that he has only taken some NyQuil few weeks ago and had taken Percocet which was given to him last time and that also was few days ago. Denies having taking any NSAIDs or Tylenol. Denies any fever chills. Patient has been admitted for further management.   Patient states that he has been admitted several times before for acute pancreatitis last time being in last November. He was admitted at [facility] and was told that he had pancreatitis from his alcoholism and diet. He has not drank alcohol for last year and a half. Denies any chest pain shortness of breath.   Hospital Course:    Transaminasemia/abdominal pain/intractible vomiting.  Unclear what the etiology of his hepatitis was, however, his pain seemed suggestive of pancreatitis given radiation to back despite normal lipase and no evidence of pancreatitis on MRCP.  Patient was made NPO and started on IVF.  He required frequent IV dilaudid to control his pain.  He was started on PPI and carafate and seen by GI.  He was tested for infectious, autoimmune causes of hepatitis.  See full results below.  AMA was positive, but per GI, PBC is usually not associated with severe transaminitis.  Patient was able to slowly advance to low fat diet without increasing his abdominal pain.  His liver function tests trended down gradually.  Follow up with [facility] at discharge.  - HCV positive (known diagnosis)  - Hep B SAg neg  - Hep A neg - ANA neg, Viral hepatitis panel, HIV NR, ESR 3, lipid panel wnl  - Check ceruloplasmin 22 wnl  - Check alpha-1 antitrypsin wnl, AMA positive, ASMA 6 wnl  - urine drug screen positive for opiates and THC  - Abdominal ultrasound shows heterogenous liver with prominent portal triads, 1.4 x 1.2cm hypoechoic focus in the liver, normal gallbladder  - MRCP negative. Two small liver lesions c/w hemangiomas   -  Needs Hep A and Hep B vaccines if not already complete.  Did NOT test for Hep A or Hep B IgG to determine need for testing during this admission.    Polysubstance abuse  -05/11/2012 UDS--positive for  THC, barbituates  -Denies any alcohol use in over one year, but abused in the past  -Denies use of any supplements from nutritional health stores  -last used cannibis 06/10/12.   -  He appeared to have some drug-seeking behaviors and became quite angry when we refused to give additional IV dilaudid after he had a fight with his mother.  Despite having severe pain, he was seen frequently ambulating up and down the halls and chatting with people in the halls.    Consultants:  GI Procedures:   MRCP Antibiotics:  none   Discharge Exam: Filed Vitals:   06/14/12 0600  BP: 128/78  Pulse: 63  Temp: 98.1 F (36.7 C)  Resp: 18   Filed Vitals:   06/13/12 0653 06/13/12 1553 06/13/12 2200 06/14/12 0600  BP: 143/82 144/79 142/93 128/78  Pulse: 67 80 85 63  Temp: 98 F (36.7 C) 98 F (36.7 C) 97.7 F (36.5 C) 98.1 F (36.7 C)  TempSrc: Oral Oral Oral Oral  Resp: 18 16 18 18   Height:      Weight: 93.804 kg (206 lb 12.8 oz)   92.08 kg (203 lb)  SpO2: 98% 100% 97% 98%    General: Avg wt AAM, No acute distress.  HEENT: NCAT, MMM  Cardiovascular: RRR, nl S1, S2 no mrg, 2+ pulses, warm extremities  Respiratory: CTAB, no increased WOB  Abdomen: NABS, soft, mildly TTP in the epigastric region, ND, stable from prior MSK: Normal tone and bulk, no LEE  Neuro: Grossly intact   Discharge Instructions      Discharge Orders   Future Orders Complete By Expires     Call MD for:  difficulty breathing, headache or visual disturbances  As directed     Call MD for:  extreme fatigue  As directed     Call MD for:  hives  As directed     Call MD for:  persistant dizziness or light-headedness  As directed     Call MD for:  persistant nausea and vomiting  As directed     Call MD for:  severe uncontrolled pain  As directed     Call MD for:  temperature >100.4  As directed     Diet - low sodium heart healthy  As directed     Discharge instructions  As directed     Comments:      Please eat a low fat diet.  You may take pain medication as needed for abdominal pain. Please call a gastroenterologist, preferably one who has seen you before, to schedule a follow up appointment for within 2-3 weeks.  Please do no drive or operate heavy machinery while taking narcotic medications. Make sure to take stool softeners daily to prevent constipation while taking narcotic medications.    Driving Restrictions  As directed     Comments:      While taking narcotic medications    Increase activity  slowly  As directed         Medication List    TAKE these medications       oxyCODONE 15 MG immediate release tablet  Commonly known as:  ROXICODONE  Take 1 tablet (15 mg total) by mouth every 4 (four) hours as needed.     pantoprazole 40 MG tablet  Commonly known as:  PROTONIX  Take 1 tablet (40 mg total) by mouth daily.       Follow-up Information   Follow up with GI at [facility]. Schedule an appointment as  soon as possible for a visit in 2 weeks.      The results of significant diagnostics from this hospitalization (including imaging, microbiology, ancillary and laboratory) are listed below for reference.    Significant Diagnostic Studies: Dg Abd 1 View  06/11/2012  *RADIOLOGY REPORT*  Clinical Data: Abdominal pain  ABDOMEN - 1 VIEW  Comparison: 05/11/2012 CT  Findings: The bowel gas pattern is non-obstructive. Organ outlines are normal where seen. No acute or aggressive osseous abnormality identified.  Hemidiaphragms excluded from the image.  IMPRESSION: Nonobstructive bowel gas pattern.   Original Report Authenticated By: Jearld Lesch, M.D.    US Abdomen Complete  06/11/2012  *RADIOLOGY REPORT*  Clinical Data:  Elevated LFTs, abdominal pain.  Hepatitis C.  COMPLETE ABDOMINAL ULTRASOUND  Comparison:  05/11/2012 CT  Findings:  Gallbladder:  No gallstones, gallbladder wall thickening, or pericholecystic fluid.  Common bile duct:  Normal diameter at 4 mm.  Liver:  Heterogeneous in echogenicity with prominent portal triads. There is a 1.4 x 1.2 x 1.0 cm hyperechoic focus, corresponding to the hypodensity seen on recent CT.  IVC:  Appears normal.  Pancreas:  Poorly visualized due to limited acoustic windows.  No focal abnormality within the neck.  The head, body, and tail are partially obscured.  Spleen:  Measures 10 cm.  Within normal limits.  Right Kidney:  Measures 11 cm.  No hydronephrosis or focal abnormality.  Left Kidney:  Measures 12 cm.  No hydronephrosis or focal abnormality.   Abdominal aorta:  No aneurysm identified.  IMPRESSION: 1.4 cm hyperechoic focus within the liver is favored to reflect a hemangioma. Given the stated history of hepatitis C, recommend this be confirmed with a non emergent MRI follow-up.  Heterogeneous hepatic echogenicity with prominence of the portal triads, suggests underlying hepatitis.   Original Report Authenticated By: Jearld Lesch, M.D.    Mr 3d Recon At Scanner  06/11/2012  *RADIOLOGY REPORT*  Clinical Data:  Abdominal pain with elevated liver function studies.  History of hepatitis C and pancreatitis.  MRI ABDOMEN WITHOUT AND WITH CONTRAST (INCLUDING MRCP)  Technique:  Multiplanar multisequence MR imaging of the abdomen was performed both before and after the administration of intravenous contrast. Heavily T2-weighted images of the biliary and pancreatic ducts were obtained, and three-dimensional MRCP images were rendered by post processing.  Contrast: 19mL MULTIHANCE GADOBENATE DIMEGLUMINE 529 MG/ML IV SOLN  Comparison:  Abdominal ultrasound 06/11/2012.  Abdominal CT 05/11/2012.  Findings:  Some of the thin section MRCP and postcontrast images are motion degraded.  There is no evidence of biliary dilatation. There is no evidence of cholelithiasis or choledocholithiasis.  The gallbladder appears normal.  The pancreatic duct is not dilated.  There is no evidence of pancreas divisum.  The pancreas appears normal without surrounding edema or fluid collection.  The pancreas enhances normally following contrast.  As demonstrated on the recent studies, there is a 1 cm well circumscribed lesion centrally in the right hepatic lobe which demonstrates low T1 and high T2 signal.  There is another similar lesion in the medial segment the left hepatic lobe which measures 1.3 cm in diameter.  The lesion in the right hepatic lobe demonstrates peripheral discontinuous enhancement typical of a hemangioma.  The lesion is not fully opacified on delayed imaging. The  lesion in the left hepatic lobe demonstrates earlier discontinuous peripheral enhancement and is fully opacified on delayed imaging.  There are no suspicious liver lesions.  The hepatic contours are normal.  The spleen, gallbladder,  adrenal glands and kidneys appear normal. Small lymph nodes within the porta hepatis are nonspecific.  IMPRESSION:  1.  Negative MRCP.  No evidence of biliary dilatation or choledocholithiasis. 2.  No evidence of complicated pancreatitis. 3.  There are two small liver lesions with imaging characteristics typical of hemangiomas.  No suspicious liver lesions identified. 4.  Nonspecific lymph nodes in the porta hepatis, possibly related to reported hepatitis C.   Original Report Authenticated By: Carey Bullocks, M.D.    Mr Abd W/wo Cm/mrcp  06/11/2012  *RADIOLOGY REPORT*  Clinical Data:  Abdominal pain with elevated liver function studies.  History of hepatitis C and pancreatitis.  MRI ABDOMEN WITHOUT AND WITH CONTRAST (INCLUDING MRCP)  Technique:  Multiplanar multisequence MR imaging of the abdomen was performed both before and after the administration of intravenous contrast. Heavily T2-weighted images of the biliary and pancreatic ducts were obtained, and three-dimensional MRCP images were rendered by post processing.  Contrast: 19mL MULTIHANCE GADOBENATE DIMEGLUMINE 529 MG/ML IV SOLN  Comparison:  Abdominal ultrasound 06/11/2012.  Abdominal CT 05/11/2012.  Findings:  Some of the thin section MRCP and postcontrast images are motion degraded.  There is no evidence of biliary dilatation. There is no evidence of cholelithiasis or choledocholithiasis.  The gallbladder appears normal.  The pancreatic duct is not dilated.  There is no evidence of pancreas divisum.  The pancreas appears normal without surrounding edema or fluid collection.  The pancreas enhances normally following contrast.  As demonstrated on the recent studies, there is a 1 cm well circumscribed lesion centrally in the  right hepatic lobe which demonstrates low T1 and high T2 signal.  There is another similar lesion in the medial segment the left hepatic lobe which measures 1.3 cm in diameter.  The lesion in the right hepatic lobe demonstrates peripheral discontinuous enhancement typical of a hemangioma.  The lesion is not fully opacified on delayed imaging. The lesion in the left hepatic lobe demonstrates earlier discontinuous peripheral enhancement and is fully opacified on delayed imaging.  There are no suspicious liver lesions.  The hepatic contours are normal.  The spleen, gallbladder, adrenal glands and kidneys appear normal. Small lymph nodes within the porta hepatis are nonspecific.  IMPRESSION:  1.  Negative MRCP.  No evidence of biliary dilatation or choledocholithiasis. 2.  No evidence of complicated pancreatitis. 3.  There are two small liver lesions with imaging characteristics typical of hemangiomas.  No suspicious liver lesions identified. 4.  Nonspecific lymph nodes in the porta hepatis, possibly related to reported hepatitis C.   Original Report Authenticated By: Carey Bullocks, M.D.     Microbiology: Recent Results (from the past 240 hour(s))  CULTURE, BLOOD (ROUTINE X 2)     Status: None   Collection Time    06/11/12  8:30 AM      Result Value Range Status   Specimen Description BLOOD LEFT ANTECUBITAL   Final   Special Requests BOTTLES DRAWN AEROBIC AND ANAEROBIC 5CC EACH   Final   Culture  Setup Time 06/11/2012 12:32   Final   Culture     Final   Value:        BLOOD CULTURE RECEIVED NO GROWTH TO DATE CULTURE WILL BE HELD FOR 5 DAYS BEFORE ISSUING A FINAL NEGATIVE REPORT   Report Status PENDING   Incomplete  CULTURE, BLOOD (ROUTINE X 2)     Status: None   Collection Time    06/11/12  8:30 AM      Result Value Range Status  Specimen Description BLOOD RIGHT HAND   Final   Special Requests BOTTLES DRAWN AEROBIC AND ANAEROBIC 10CC EACH   Final   Culture  Setup Time 06/11/2012 12:32   Final    Culture     Final   Value:        BLOOD CULTURE RECEIVED NO GROWTH TO DATE CULTURE WILL BE HELD FOR 5 DAYS BEFORE ISSUING A FINAL NEGATIVE REPORT   Report Status PENDING   Incomplete     Labs: Basic Metabolic Panel:  Recent Labs Lab 06/11/12 0010 06/11/12 0640 06/12/12 0515 06/13/12 1148 06/14/12 0525  NA 137 136 139 137 138  K 3.7 4.2 4.0 3.5 3.6  CL 99 101 105 101 101  CO2 22 23 28 30 30   GLUCOSE 141* 119* 103* 83 102*  BUN 17 17 13 6 8   CREATININE 0.81 0.85 0.93 0.89 0.96  CALCIUM 10.4 8.9 8.6 8.9 9.2   Liver Function Tests:  Recent Labs Lab 06/11/12 0010 06/11/12 0640 06/12/12 0515 06/13/12 1148 06/14/12 0525  AST 647* 393* 295* 147* 96*  ALT 1398* 986* 768* 552* 459*  ALKPHOS 267* 199* 159* 144* 147*  BILITOT 2.1* 1.5* 1.2 1.0 0.7  PROT 9.2* 7.1 6.3 6.6 6.9  ALBUMIN 4.7 3.5 3.1* 3.2* 3.4*    Recent Labs Lab 06/11/12 0010  LIPASE 14   No results found for this basename: AMMONIA,  in the last 168 hours CBC:  Recent Labs Lab 06/11/12 0010 06/11/12 0640 06/12/12 0515 06/13/12 1148 06/14/12 0525  WBC 17.3* 13.8* 8.8 5.8 7.2  NEUTROABS 14.8* 10.7* 3.8  --   --   HGB 15.5 12.8* 11.9* 11.8* 11.9*  HCT 44.3 37.7* 36.7* 35.5* 35.8*  MCV 89.3 90.2 92.2 92.7 92.0  PLT 252 188 191 188 210   Cardiac Enzymes:  Recent Labs Lab 06/11/12 0240  TROPONINI <0.30   BNP: BNP (last 3 results) No results found for this basename: PROBNP,  in the last 8760 hours CBG:  Recent Labs Lab 06/13/12 1143 06/13/12 1805 06/13/12 2355 06/14/12 0528 06/14/12 1118  GLUCAP 95 117* 92 95 94    Time coordinating discharge: 45 minutes  Signed:  Ludy Messamore  [facility] 06/14/2012, 3:48 PM

## 2012-06-14 NOTE — Progress Notes (Signed)
Patient d/c home, ambulatory,stable, pain is controlled.Hulda Marin RN

## 2012-06-14 NOTE — Progress Notes (Signed)
D/c instructions given and also prescription,verbalized understanding.Stable.Trixie Dredge RN

## 2012-06-17 LAB — CULTURE, BLOOD (ROUTINE X 2): Culture: NO GROWTH

## 2012-07-04 ENCOUNTER — Emergency Department (HOSPITAL_COMMUNITY)
Admission: EM | Admit: 2012-07-04 | Discharge: 2012-07-04 | Disposition: A | Discharge status: Home / Self Care | Payer: Self-pay | Attending: Emergency Medicine | Admitting: Emergency Medicine

## 2012-07-04 ENCOUNTER — Encounter (HOSPITAL_COMMUNITY): Payer: Self-pay | Admitting: *Deleted

## 2012-07-04 ENCOUNTER — Encounter (HOSPITAL_COMMUNITY): Payer: Self-pay | Admitting: Cardiology

## 2012-07-04 DIAGNOSIS — F911 Conduct disorder, childhood-onset type: Secondary | ICD-10-CM | POA: Insufficient documentation

## 2012-07-04 DIAGNOSIS — IMO0002 Reserved for concepts with insufficient information to code with codable children: Secondary | ICD-10-CM | POA: Insufficient documentation

## 2012-07-04 DIAGNOSIS — Z8619 Personal history of other infectious and parasitic diseases: Secondary | ICD-10-CM | POA: Insufficient documentation

## 2012-07-04 DIAGNOSIS — R1013 Epigastric pain: Secondary | ICD-10-CM | POA: Insufficient documentation

## 2012-07-04 DIAGNOSIS — R7989 Other specified abnormal findings of blood chemistry: Secondary | ICD-10-CM | POA: Insufficient documentation

## 2012-07-04 DIAGNOSIS — R1110 Vomiting, unspecified: Secondary | ICD-10-CM | POA: Insufficient documentation

## 2012-07-04 DIAGNOSIS — R197 Diarrhea, unspecified: Secondary | ICD-10-CM | POA: Insufficient documentation

## 2012-07-04 DIAGNOSIS — R61 Generalized hyperhidrosis: Secondary | ICD-10-CM | POA: Insufficient documentation

## 2012-07-04 DIAGNOSIS — F17200 Nicotine dependence, unspecified, uncomplicated: Secondary | ICD-10-CM | POA: Insufficient documentation

## 2012-07-04 DIAGNOSIS — Z8719 Personal history of other diseases of the digestive system: Secondary | ICD-10-CM | POA: Insufficient documentation

## 2012-07-04 DIAGNOSIS — R109 Unspecified abdominal pain: Secondary | ICD-10-CM

## 2012-07-04 HISTORY — DX: Gastritis, unspecified, without bleeding: K29.70

## 2012-07-04 LAB — AMYLASE: Amylase: 62 U/L (ref 0–105)

## 2012-07-04 LAB — URINE MICROSCOPIC-ADD ON

## 2012-07-04 LAB — URINALYSIS, ROUTINE W REFLEX MICROSCOPIC
Bilirubin Urine: NEGATIVE
Hgb urine dipstick: NEGATIVE
Ketones, ur: 15 mg/dL — AB
Nitrite: NEGATIVE
pH: 7 (ref 5.0–8.0)

## 2012-07-04 LAB — CBC WITH DIFFERENTIAL/PLATELET
Basophils Absolute: 0 10*3/uL (ref 0.0–0.1)
Basophils Relative: 0 % (ref 0–1)
HCT: 40.9 % (ref 39.0–52.0)
Hemoglobin: 15.1 g/dL (ref 13.0–17.0)
Lymphocytes Relative: 9 % — ABNORMAL LOW (ref 12–46)
Lymphs Abs: 1.8 10*3/uL (ref 0.7–4.0)
MCHC: 35.7 g/dL (ref 30.0–36.0)
Monocytes Relative: 6 % (ref 3–12)
Neutro Abs: 13.3 10*3/uL — ABNORMAL HIGH (ref 1.7–7.7)
Neutro Abs: 15.8 10*3/uL — ABNORMAL HIGH (ref 1.7–7.7)
Neutrophils Relative %: 83 % — ABNORMAL HIGH (ref 43–77)
Neutrophils Relative %: 87 % — ABNORMAL HIGH (ref 43–77)
Platelets: 263 10*3/uL (ref 150–400)
RBC: 4.79 MIL/uL (ref 4.22–5.81)
RDW: 13.3 % (ref 11.5–15.5)
WBC: 16.1 10*3/uL — ABNORMAL HIGH (ref 4.0–10.5)
WBC: 18.2 10*3/uL — ABNORMAL HIGH (ref 4.0–10.5)

## 2012-07-04 LAB — COMPREHENSIVE METABOLIC PANEL
ALT: 577 U/L — ABNORMAL HIGH (ref 0–53)
ALT: 557 U/L — ABNORMAL HIGH (ref 0–53)
AST: 194 U/L — ABNORMAL HIGH (ref 0–37)
Albumin: 4.8 g/dL (ref 3.5–5.2)
Alkaline Phosphatase: 149 U/L — ABNORMAL HIGH (ref 39–117)
BUN: 14 mg/dL (ref 6–23)
Calcium: 10.1 mg/dL (ref 8.4–10.5)
Chloride: 103 mEq/L (ref 96–112)
GFR calc Af Amer: >90 mL/min (ref >90)
Glucose, Bld: 126 mg/dL — ABNORMAL HIGH (ref 70–99)
Potassium: 3.6 mEq/L (ref 3.5–5.1)
Sodium: 139 mEq/L (ref 135–145)
Total Bilirubin: 0.6 mg/dL (ref 0.3–1.2)
Total Protein: 8.5 g/dL — ABNORMAL HIGH (ref 6.0–8.3)

## 2012-07-04 LAB — LIPASE, BLOOD: Lipase: 30 U/L (ref 11–59)

## 2012-07-04 MED ORDER — ONDANSETRON HCL 4 MG/2ML IJ SOLN
4.0000 mg | Freq: Once | INTRAMUSCULAR | Status: AC
  Administered 2012-07-04: 4 mg via INTRAVENOUS
  Filled 2012-07-04: qty 2

## 2012-07-04 MED ORDER — ONDANSETRON HCL 4 MG/2ML IJ SOLN: 4.0000 mg | Freq: Once | INTRAMUSCULAR | Status: AC

## 2012-07-04 MED ORDER — HYDROMORPHONE HCL PF 2 MG/ML IJ SOLN
2.0000 mg | Freq: Once | INTRAMUSCULAR | Status: AC
  Administered 2012-07-04: 2 mg via INTRAVENOUS
  Filled 2012-07-04: qty 1

## 2012-07-04 MED ORDER — HYDROMORPHONE HCL PF 1 MG/ML IJ SOLN
1.0000 mg | Freq: Once | INTRAMUSCULAR | Status: AC
  Administered 2012-07-04: 1 mg via INTRAVENOUS
  Filled 2012-07-04: qty 1

## 2012-07-04 MED ORDER — HYDROCODONE-ACETAMINOPHEN 5-325 MG PO TABS: 1.0000 | ORAL_TABLET | Freq: Four times a day (QID) | ORAL | Status: DC | PRN

## 2012-07-04 MED ORDER — TRAMADOL HCL 50 MG PO TABS: 50.0000 mg | ORAL_TABLET | Freq: Four times a day (QID) | ORAL | Status: DC | PRN

## 2012-07-04 MED ORDER — ONDANSETRON HCL 4 MG/2ML IJ SOLN
INTRAMUSCULAR | Status: AC
  Administered 2012-07-04: 4 mg via INTRAVENOUS
  Filled 2012-07-04: qty 2

## 2012-07-04 MED ORDER — METOCLOPRAMIDE HCL 5 MG/ML IJ SOLN
10.0000 mg | Freq: Once | INTRAMUSCULAR | Status: AC
  Administered 2012-07-04: 10 mg via INTRAVENOUS
  Filled 2012-07-04: qty 2

## 2012-07-04 MED ORDER — PROMETHAZINE HCL 25 MG PO TABS: 25.0000 mg | ORAL_TABLET | Freq: Four times a day (QID) | ORAL | Status: DC | PRN

## 2012-07-04 MED ORDER — KETOROLAC TROMETHAMINE 30 MG/ML IJ SOLN
30.0000 mg | Freq: Once | INTRAMUSCULAR | Status: AC
  Administered 2012-07-04: 30 mg via INTRAVENOUS
  Filled 2012-07-04: qty 1

## 2012-07-04 MED ORDER — GI COCKTAIL ~~LOC~~
30.0000 mL | Freq: Once | ORAL | Status: AC
  Administered 2012-07-04: 30 mL via ORAL
  Filled 2012-07-04: qty 30

## 2012-07-04 MED ORDER — SODIUM CHLORIDE 0.9 % IV BOLUS (SEPSIS)
1000.0000 mL | Freq: Once | INTRAVENOUS | Status: AC
  Administered 2012-07-04: 1000 mL via INTRAVENOUS

## 2012-07-04 MED ORDER — ONDANSETRON HCL 4 MG PO TABS: 4.0000 mg | ORAL_TABLET | Freq: Four times a day (QID) | ORAL | Status: DC

## 2012-07-04 NOTE — ED Provider Notes (Signed)
History     CSN: [id number]  Arrival date & time 07/04/12  2026   First MD Initiated Contact with Patient 07/04/12 2118      Chief Complaint  Patient presents with  . Abdominal Pain    (Consider location/radiation/quality/duration/timing/severity/associated sxs/prior treatment) HPI Patient presents to the emergency department with mid abdominal pain.  Patient, states, that he's had this type of pain in the past with pancreatitis.  Patient just left [place] emergency department about one hour prior to arrival here.  Patient, states, that he needs pain medications and he usually gets Dilaudid.  Patient denies chest pain, shortness breath, fever, and weakness, back pain, diarrhea, bloody stool, syncope or dizziness.  Patient, states, that he was admitted in March for her elevated liver enzymes.  Patient, states, that palpation makes his pain, worse Past Medical History  Diagnosis Date  . Pancreatitis   . Hepatitis C   . Gastritis     History reviewed. No pertinent past surgical history.  Family History  Problem Relation Age of Onset  . CAD Father     History  Substance Use Topics  . Smoking status: Heavy Tobacco Smoker -- 1.00 packs/day  . Smokeless tobacco: Not on file  . Alcohol Use: No      Review of Systems All other systems negative except as documented in the HPI. All pertinent positives and negatives as reviewed in the HPI. Allergies  Review of patient's allergies indicates no known allergies.  Home Medications   Current Outpatient Rx  Name  Route  Sig  Dispense  Refill  . oxyCODONE (ROXICODONE) 15 MG immediate release tablet   Oral   Take 1 tablet (15 mg total) by mouth every 4 (four) hours as needed.   30 tablet   0     BP 134/76  Pulse 86  Temp(Src) 97.7 F (36.5 C) (Oral)  Resp 20  SpO2 100%  Physical Exam  Nursing note and vitals reviewed. Constitutional: He is oriented to person, place, and time. He appears well-developed and  well-nourished. No distress.  HENT:  Head: Normocephalic and atraumatic.  Mouth/Throat: Oropharynx is clear and moist.  Cardiovascular: Normal rate, regular rhythm and normal heart sounds.  Exam reveals no gallop and no friction rub.   No murmur heard. Pulmonary/Chest: Effort normal and breath sounds normal. No respiratory distress.  Abdominal: Soft. Normal appearance and bowel sounds are normal. There is no hepatosplenomegaly. There is no rigidity, no rebound and no guarding.    Neurological: He is alert and oriented to person, place, and time.  Skin: Skin is warm and dry. No rash noted.  Psychiatric: His affect is angry. He is agitated.    ED Course  Procedures (including critical care time)  Labs Reviewed  CBC WITH DIFFERENTIAL - Abnormal; Notable for the following:    WBC 18.2 (*)    Neutrophils Relative 87 (*)    Neutro Abs 15.8 (*)    Lymphocytes Relative 9 (*)    All other components within normal limits  COMPREHENSIVE METABOLIC PANEL - Abnormal; Notable for the following:    Glucose, Bld 147 (*)    Total Protein 8.5 (*)    AST 194 (*)    ALT 557 (*)    Alkaline Phosphatase 145 (*)    All other components within normal limits  LIPASE, BLOOD  AMYLASE  The patient is standing at the secretaries desk demanding pain medication when I am walking to his room. The patient demands that I give  him medications. He states he normally gets dilaudid. The patient seems very animated with his pain. The patient states that [facility] did not give him any treatment. The patient was treated with 3 of Dilaudid there. The patient states that he does not follow up with any GI doctor about his medical issues. The patient is stable here. The patient is putting on a dramatic event but with all of the animation and loud vocalizations this seems to be somewhat forced rather than true pain. The patient does not want me to examine his abdomen. The patient has been seen a multiple facilities for his pain. The  patient Will need to seek long term care of his issues. The patient was wretching in the waiting room.    MDM  MDM Number of Diagnoses or Management Options MDM Reviewed: nursing note and vitals Reviewed previous: labs, CT scan, x-ray and ultrasound Interpretation: labs             Carlyle Dolly, PA-C 07/04/12 2223

## 2012-07-04 NOTE — ED Notes (Signed)
Patient observed walking in hallway, demanding pain medication, NAD, with steady gait, appropriate mentation. Advised patient that EDP is aware

## 2012-07-04 NOTE — ED Provider Notes (Addendum)
I saw and evaluated the patient, reviewed the resident's note and I agree with the findings and plan.  Pt with chronic abdominal pain, numerous ED visits and admissions at [facility] and [facility], also has been coming to [facility] the last few weeks, admitted for same last month. Abd diffusely tender. He is demanding ain and nausea medications numerous times. Awaiting labs.   Pt very agitated while being discharged. States still having pain. This a chronic issue for him. Frequent visits to various EDs. Advised that his liver labs are elevated about at baseline. Advised that the ED will not manage his chronic pain and that he needs to find a primary care and chronic pain doctor. Pt became even more agitated, security was called to the bedside to escort the patient out after IV removed.   Charles B. Bernette Mayers, MD 07/04/12 2956

## 2012-07-04 NOTE — ED Notes (Addendum)
Pt stated that he was not interested in his discharge paperwork, and refused to take his paperwork with him prior to leaving.  Pt stated that the MD talked to the pt and that he received discharge instructions verbally already.

## 2012-07-04 NOTE — ED Notes (Signed)
Pt reports abd pain started this morning. States he has a hx of pancreatitis and gastritis. States he was recently seen here for the same and given pain medication. States he was at [facility] as a pt for about a week. Pt with n/v and diaphoresis at triage.

## 2012-07-04 NOTE — ED Notes (Signed)
Pt c/o severe upper abd pain; vomiting; actively vomiting in triage; moaning in pain

## 2012-07-04 NOTE — ED Notes (Signed)
Patient in hallway talking loudly, demanding pain medication. Patient advised that EDP is aware and reviewing chart for further treatment. Patient asked if he would step back in room while awaiting EDP. Patient not willing to wait in room. Security called, Technical sales engineer in hallway escorting patient back to room.

## 2012-07-04 NOTE — ED Notes (Signed)
[id number]<AV> Expected date:<BR> Expected time:<BR> Means of arrival:<BR> Comments:<BR> Triage 1

## 2012-07-04 NOTE — ED Provider Notes (Signed)
Medical screening examination/treatment/procedure(s) were performed by non-physician practitioner and as supervising physician I was immediately available for consultation/collaboration.   Richardean Canal, MD 07/04/12 [phone]

## 2012-07-04 NOTE — ED Provider Notes (Signed)
MSE was initiated and I personally evaluated the patient and placed orders (if any) at  2:35 PM on July 04, 2012. 27 year old male with past medical history pancreatitis, gastritis and hepatitis C presents emergency department complaining of sudden onset midepigastric abdominal pain with associated nausea and vomiting this morning. He was admitted at [facility] long hospital last week for elevated liver functions and similar pain. Patient states he is unable to keep anything down and there is a large amount of vomitus present in the emesis bag in exam room. He appears in a great amount of pain. Marked tenderness to palpation of his mid epigastric region with guarding on exam. Bowel sounds normal. Labs have been placed in triage prior to patient being seen. I will give him pain and nausea control.  The patient appears stable so that the remainder of the MSE may be completed by another provider.  Trevor Mace, PA-C 07/04/12 1446

## 2012-07-04 NOTE — ED Notes (Signed)
Pt given IV pain med.  Dr. Bernette Mayers in room.  Pt requesting more pain med.  Dr. Bernette Mayers informed him that it had not had time to work.  After Dr. Bernette Mayers left room pt again ask for more Dilaudid and request that Dr. Bernette Mayers come back into room.  Dr. Bernette Mayers informed of same.

## 2012-07-04 NOTE — ED Provider Notes (Signed)
History     CSN: [id number]  Arrival date & time 07/04/12  1406   First MD Initiated Contact with Patient 07/04/12 1434      Chief Complaint  Patient presents with  . Abdominal Pain    (Consider location/radiation/quality/duration/timing/severity/associated sxs/prior treatment) HPI Comments: "I have pancreatitis and gastritis and I need my pain medicine!"  Patient is a 27 y.o. male presenting with abdominal pain.  Abdominal Pain Pain location:  Epigastric Pain quality: sharp   Pain radiates to:  Does not radiate Pain severity:  Severe Onset quality:  Gradual Duration: started today. Timing:  Constant Progression:  Worsening Chronicity:  Recurrent Relieved by:  Nothing Associated symptoms: diarrhea and vomiting   Associated symptoms: no chest pain, no cough, no fever, no nausea and no shortness of breath     Past Medical History  Diagnosis Date  . Pancreatitis   . Hepatitis C   . Gastritis     History reviewed. No pertinent past surgical history.  Family History  Problem Relation Age of Onset  . CAD Father     History  Substance Use Topics  . Smoking status: Heavy Tobacco Smoker -- 1.00 packs/day  . Smokeless tobacco: Not on file  . Alcohol Use: No      Review of Systems  Constitutional: Negative for fever.  HENT: Negative for congestion, facial swelling and trouble swallowing.   Respiratory: Negative for cough and shortness of breath.   Cardiovascular: Negative for chest pain.  Gastrointestinal: Positive for vomiting, abdominal pain and diarrhea. Negative for nausea.  Genitourinary: Negative for difficulty urinating.  Skin: Negative for rash.  All other systems reviewed and are negative.    Allergies  Review of patient's allergies indicates no known allergies.  Home Medications   Current Outpatient Rx  Name  Route  Sig  Dispense  Refill  . oxyCODONE (ROXICODONE) 15 MG immediate release tablet   Oral   Take 1 tablet (15 mg total) by mouth  every 4 (four) hours as needed.   30 tablet   0   . pantoprazole (PROTONIX) 40 MG tablet   Oral   Take 1 tablet (40 mg total) by mouth daily.   60 tablet   0     BP 161/105  Pulse 78  Temp(Src) 98.2 F (36.8 C) (Oral)  Resp 26  SpO2 100%  Physical Exam  Nursing note and vitals reviewed. Constitutional: He is oriented to person, place, and time. He appears well-developed and well-nourished. No distress.  HENT:  Head: Normocephalic and atraumatic.  Mouth/Throat: Oropharynx is clear and moist.  Eyes: Conjunctivae are normal. Pupils are equal, round, and reactive to light. No scleral icterus.  Neck: Normal range of motion. Neck supple.  Cardiovascular: Normal rate, regular rhythm, normal heart sounds and intact distal pulses.   No murmur heard. Pulmonary/Chest: Effort normal and breath sounds normal. No stridor. No respiratory distress. He has no wheezes. He has no rales.  Abdominal: Soft. He exhibits no distension. There is tenderness in the epigastric area. There is guarding (voluntary). There is no rigidity and no rebound.  Musculoskeletal: Normal range of motion. He exhibits no edema.  Neurological: He is alert and oriented to person, place, and time.  Skin: Skin is warm. No rash noted. He is diaphoretic.  Psychiatric: He has a normal mood and affect. His behavior is normal.    ED Course  Procedures (including critical care time)  Labs Reviewed  CBC WITH DIFFERENTIAL - Abnormal; Notable for the following:  WBC 16.1 (*)    Neutrophils Relative 83 (*)    Neutro Abs 13.3 (*)    Lymphocytes Relative 11 (*)    All other components within normal limits  COMPREHENSIVE METABOLIC PANEL - Abnormal; Notable for the following:    Glucose, Bld 126 (*)    Total Protein 8.7 (*)    AST 235 (*)    ALT 577 (*)    Alkaline Phosphatase 149 (*)    All other components within normal limits  URINALYSIS, ROUTINE W REFLEX MICROSCOPIC - Abnormal; Notable for the following:    Ketones,  ur 15 (*)    Protein, ur 30 (*)    All other components within normal limits  URINE MICROSCOPIC-ADD ON - Abnormal; Notable for the following:    Bacteria, UA FEW (*)    Casts GRANULAR CAST (*)    All other components within normal limits  LIPASE, BLOOD   No results found.   1. Elevated LFTs   2. Abdominal pain       MDM   27 yo male who was recently admitted to [facility] secondary to abdominal pain and elevated LFTs presenting with recurrent abdominal pain which he describes as identical to prior episodes.  He states he has pancreatitis and gastritis.  His vitals are stable, but he appears in pain, walking around the room uncomfortably.  Labs and pain control ordered from triage.  Do not think he needs imaging for his recurrent   4:02 PM Pt has been admitted for similar episodes at [facility] and [facility].  When he was admitted to [facility], there was concern for drug seeking behavior and I have these concerns as well.  His labs are currently pending.    6:32 PM LFTs remain elevated, but not as high as prior.  Pt's vitals remain stable.  Agreed to provide GI cocktail for symptoms, but think additional narcotics are not warranted and provide potential for abuse.  Advised close follow up with PCP and GI specialist for recheck of LFTs.     Rennis Petty, MD 07/05/12 [phone]

## 2012-07-04 NOTE — ED Notes (Signed)
Pt requesting more pain meds before being discharged.

## 2012-07-05 NOTE — ED Provider Notes (Signed)
Medical screening examination/treatment/procedure(s) were performed by non-physician practitioner and as supervising physician I was immediately available for consultation/collaboration.  Neya Creegan, MD 07/05/12 0701 

## 2012-07-06 ENCOUNTER — Emergency Department (HOSPITAL_COMMUNITY)
Admission: EM | Admit: 2012-07-06 | Discharge: 2012-07-06 | Disposition: A | Discharge status: Home / Self Care | Payer: Self-pay | Attending: Emergency Medicine | Admitting: Emergency Medicine

## 2012-07-06 ENCOUNTER — Encounter (HOSPITAL_COMMUNITY): Payer: Self-pay | Admitting: Emergency Medicine

## 2012-07-06 DIAGNOSIS — R109 Unspecified abdominal pain: Secondary | ICD-10-CM

## 2012-07-06 DIAGNOSIS — Z8719 Personal history of other diseases of the digestive system: Secondary | ICD-10-CM | POA: Insufficient documentation

## 2012-07-06 DIAGNOSIS — F17200 Nicotine dependence, unspecified, uncomplicated: Secondary | ICD-10-CM | POA: Insufficient documentation

## 2012-07-06 DIAGNOSIS — R1013 Epigastric pain: Secondary | ICD-10-CM | POA: Insufficient documentation

## 2012-07-06 LAB — CBC WITH DIFFERENTIAL/PLATELET
Eosinophils Relative: 1 % (ref 0–5)
HCT: 41 % (ref 39.0–52.0)
Lymphocytes Relative: 15 % (ref 12–46)
Lymphs Abs: 1.9 10*3/uL (ref 0.7–4.0)
MCV: 89.5 fL (ref 78.0–100.0)
Monocytes Absolute: 0.9 10*3/uL (ref 0.1–1.0)
Neutro Abs: 9.7 10*3/uL — ABNORMAL HIGH (ref 1.7–7.7)
Platelets: 220 10*3/uL (ref 150–400)
RBC: 4.58 MIL/uL (ref 4.22–5.81)
WBC: 12.6 10*3/uL — ABNORMAL HIGH (ref 4.0–10.5)

## 2012-07-06 LAB — COMPREHENSIVE METABOLIC PANEL
ALT: 628 U/L — ABNORMAL HIGH (ref 0–53)
AST: 313 U/L — ABNORMAL HIGH (ref 0–37)
CO2: 26 mEq/L (ref 19–32)
Calcium: 9.6 mg/dL (ref 8.4–10.5)
Chloride: 103 mEq/L (ref 96–112)
GFR calc Af Amer: >90 mL/min (ref >90)
GFR calc non Af Amer: >90 mL/min (ref >90)
Glucose, Bld: 126 mg/dL — ABNORMAL HIGH (ref 70–99)
Sodium: 139 mEq/L (ref 135–145)
Total Bilirubin: 0.6 mg/dL (ref 0.3–1.2)

## 2012-07-06 MED ORDER — FAMOTIDINE 20 MG PO TABS
20.0000 mg | ORAL_TABLET | Freq: Once | ORAL | Status: AC
  Administered 2012-07-06: 20 mg via ORAL
  Filled 2012-07-06: qty 1

## 2012-07-06 MED ORDER — PANTOPRAZOLE SODIUM 40 MG PO TBEC: 40.0000 mg | DELAYED_RELEASE_TABLET | Freq: Every day | ORAL | Status: DC

## 2012-07-06 MED ORDER — HYDROMORPHONE HCL PF 1 MG/ML IJ SOLN
1.0000 mg | Freq: Once | INTRAMUSCULAR | Status: AC
  Administered 2012-07-06: 1 mg via INTRAVENOUS
  Filled 2012-07-06: qty 1

## 2012-07-06 MED ORDER — PANTOPRAZOLE SODIUM 40 MG IV SOLR
40.0000 mg | Freq: Once | INTRAVENOUS | Status: AC
  Administered 2012-07-06: 40 mg via INTRAVENOUS
  Filled 2012-07-06: qty 40

## 2012-07-06 MED ORDER — GI COCKTAIL ~~LOC~~
30.0000 mL | Freq: Once | ORAL | Status: AC
  Administered 2012-07-06: 30 mL via ORAL
  Filled 2012-07-06: qty 30

## 2012-07-06 MED ORDER — LORAZEPAM 2 MG/ML IJ SOLN
1.0000 mg | Freq: Once | INTRAMUSCULAR | Status: AC
  Administered 2012-07-06: 1 mg via INTRAVENOUS
  Filled 2012-07-06: qty 1

## 2012-07-06 MED ORDER — ONDANSETRON 4 MG PO TBDP
4.0000 mg | ORAL_TABLET | Freq: Once | ORAL | Status: AC
Start: 2012-07-06 — End: 2012-07-06
  Administered 2012-07-06: 4 mg via ORAL
  Filled 2012-07-06: qty 1

## 2012-07-06 MED ORDER — SODIUM CHLORIDE 0.9 % IV BOLUS (SEPSIS)
1000.0000 mL | Freq: Once | INTRAVENOUS | Status: AC
  Administered 2012-07-06: 1000 mL via INTRAVENOUS

## 2012-07-06 MED ORDER — ONDANSETRON HCL 4 MG/2ML IJ SOLN
4.0000 mg | Freq: Once | INTRAMUSCULAR | Status: AC
  Administered 2012-07-06: 4 mg via INTRAVENOUS
  Filled 2012-07-06: qty 2

## 2012-07-06 NOTE — ED Notes (Signed)
Pt states he is still in pain. Requesting to speak to MD.

## 2012-07-06 NOTE — ED Notes (Addendum)
Pt states he was just in [facility] day before yesterday with same symptoms of abdominal pain. Pt states he has a history of pancreatitis and gastritis. Pt also states he has not be able to keep anything down.  Pt states that he has vomited about 15 times since about 0730 am this morning.

## 2012-07-06 NOTE — ED Notes (Signed)
RN request to obtain labs with the start of IV 

## 2012-07-06 NOTE — ED Provider Notes (Addendum)
History     CSN: [id number]  Arrival date & time 07/06/12  1237   First MD Initiated Contact with Patient 07/06/12 1251      Chief Complaint  Patient presents with  . Abdominal Pain    (Consider location/radiation/quality/duration/timing/severity/associated sxs/prior treatment) Patient is a 27 y.o. male presenting with abdominal pain. The history is provided by the patient.  Abdominal Pain Associated symptoms: no chest pain, no cough, no fever and no shortness of breath   pt w hx gastritis, and pancreatitis c/o epigastric pain for the past day. Constant. Dull. Non radiating. Moderate to severe. No specific exacerbating or alleviating factors. Same as pain he has had in past. States initially pancreatitis was due to etoh abuse, but states has not drank in past couple yrs. No hx gallstones or pud. No prior abd surgery. Nv, w 1-2 episodes emesis, clear, not bloody or bilious. Having normal bms. No abd distension. No gu c/o. No cp or sob. No cough or uri c/o. No fever or chills.   Past Medical History  Diagnosis Date  . Pancreatitis   . Hepatitis C   . Gastritis     History reviewed. No pertinent past surgical history.  Family History  Problem Relation Age of Onset  . CAD Father     History  Substance Use Topics  . Smoking status: Heavy Tobacco Smoker -- 1.00 packs/day  . Smokeless tobacco: Not on file  . Alcohol Use: No      Review of Systems  Constitutional: Negative for fever.  HENT: Negative for neck pain.   Eyes: Negative for redness.  Respiratory: Negative for cough and shortness of breath.   Cardiovascular: Negative for chest pain.  Gastrointestinal: Positive for abdominal pain.  Genitourinary: Negative for flank pain.  Musculoskeletal: Negative for back pain.  Skin: Negative for rash.  Neurological: Negative for headaches.  Hematological: Does not bruise/bleed easily.  Psychiatric/Behavioral: Negative for confusion.    Allergies  Review of patient's  allergies indicates no known allergies.  Home Medications   Current Outpatient Rx  Name  Route  Sig  Dispense  Refill  . HYDROcodone-acetaminophen (NORCO/VICODIN) 5-325 MG per tablet   Oral   Take 1 tablet by mouth every 6 (six) hours as needed for pain.   10 tablet   0   . oxyCODONE (ROXICODONE) 15 MG immediate release tablet   Oral   Take 1 tablet (15 mg total) by mouth every 4 (four) hours as needed.   30 tablet   0   . promethazine (PHENERGAN) 25 MG tablet   Oral   Take 1 tablet (25 mg total) by mouth every 6 (six) hours as needed for nausea.   10 tablet   0     BP 154/103  Pulse 83  Temp(Src) 98 F (36.7 C) (Oral)  Resp 16  SpO2 99%  Physical Exam  Nursing note and vitals reviewed. Constitutional: He is oriented to person, place, and time. He appears well-developed and well-nourished.  HENT:  Nose: Nose normal.  Mouth/Throat: Oropharynx is clear and moist.  Eyes: Conjunctivae are normal. No scleral icterus.  Neck: Neck supple. No tracheal deviation present.  Cardiovascular: Regular rhythm, normal heart sounds and intact distal pulses.  Exam reveals no gallop and no friction rub.   No murmur heard. Pulmonary/Chest: Effort normal and breath sounds normal. No accessory muscle usage. No respiratory distress.  Abdominal: Soft. Bowel sounds are normal. He exhibits no distension and no mass. There is tenderness. There is no  rebound and no guarding.  Epigastric tenderness, no rebound or guarding.   Genitourinary:  No cva tenderness  Musculoskeletal: Normal range of motion.  Neurological: He is alert and oriented to person, place, and time.  Skin: Skin is warm and dry. He is not diaphoretic.  Psychiatric: He has a normal mood and affect.    ED Course  Procedures (including critical care time)  Results for orders placed during the hospital encounter of 07/06/12  CBC WITH DIFFERENTIAL      Result Value Range   WBC 12.6 (*) 4.0 - 10.5 K/uL   RBC 4.58  4.22 - 5.81  MIL/uL   Hemoglobin 14.2  13.0 - 17.0 g/dL   HCT 40.9  81.1 - 91.4 %   MCV 89.5  78.0 - 100.0 fL   MCH 31.0  26.0 - 34.0 pg   MCHC 34.6  30.0 - 36.0 g/dL   RDW 78.2  95.6 - 21.3 %   Platelets 220  150 - 400 K/uL   Neutrophils Relative 77  43 - 77 %   Neutro Abs 9.7 (*) 1.7 - 7.7 K/uL   Lymphocytes Relative 15  12 - 46 %   Lymphs Abs 1.9  0.7 - 4.0 K/uL   Monocytes Relative 7  3 - 12 %   Monocytes Absolute 0.9  0.1 - 1.0 K/uL   Eosinophils Relative 1  0 - 5 %   Eosinophils Absolute 0.1  0.0 - 0.7 K/uL   Basophils Relative 0  0 - 1 %   Basophils Absolute 0.0  0.0 - 0.1 K/uL  COMPREHENSIVE METABOLIC PANEL      Result Value Range   Sodium 139  135 - 145 mEq/L   Potassium 3.5  3.5 - 5.1 mEq/L   Chloride 103  96 - 112 mEq/L   CO2 26  19 - 32 mEq/L   Glucose, Bld 126 (*) 70 - 99 mg/dL   BUN 15  6 - 23 mg/dL   Creatinine, Ser 0.86  0.50 - 1.35 mg/dL   Calcium 9.6  8.4 - 57.8 mg/dL   Total Protein 7.7  6.0 - 8.3 g/dL   Albumin 4.2  3.5 - 5.2 g/dL   AST 469 (*) 0 - 37 U/L   ALT 628 (*) 0 - 53 U/L   Alkaline Phosphatase 124 (*) 39 - 117 U/L   Total Bilirubin 0.6  0.3 - 1.2 mg/dL   GFR calc non Af Amer >90  >90 mL/min   GFR calc Af Amer >90  >90 mL/min  LIPASE, BLOOD      Result Value Range   Lipase 15  11 - 59 U/L     MDM  Iv ns bolus. zofran iv. Dilaudid iv. protonix iv. Pt indicates he has ride, does not have to drive home.   Reviewed nursing notes and prior charts for additional history.   Pt very aggressive about getting additional pain medication, within 5 minutes of initial dose, stating no better. Pt walking about ed to locate nurse, into EDP workroom, requesting additional meds.  Additional 1 mg dilaudid iv.  Recheck pt in room, appears very comfortable. Hr 78 rr 16.  No nv in ed. Tolerating po. abd soft nt.  Pt from [place], states his gi md at [facility], has follow up established. Pt appears stable for d/c.           Suzi Roots, MD 07/06/12 1520

## 2012-07-07 ENCOUNTER — Emergency Department (HOSPITAL_COMMUNITY)
Admission: EM | Admit: 2012-07-07 | Discharge: 2012-07-08 | Disposition: A | Discharge status: Home / Self Care | Payer: Self-pay | Attending: Emergency Medicine | Admitting: Emergency Medicine

## 2012-07-07 ENCOUNTER — Emergency Department (HOSPITAL_COMMUNITY): Payer: Self-pay

## 2012-07-07 ENCOUNTER — Encounter (HOSPITAL_COMMUNITY): Payer: Self-pay | Admitting: Emergency Medicine

## 2012-07-07 DIAGNOSIS — R109 Unspecified abdominal pain: Secondary | ICD-10-CM

## 2012-07-07 DIAGNOSIS — Z8719 Personal history of other diseases of the digestive system: Secondary | ICD-10-CM | POA: Insufficient documentation

## 2012-07-07 DIAGNOSIS — F17200 Nicotine dependence, unspecified, uncomplicated: Secondary | ICD-10-CM | POA: Insufficient documentation

## 2012-07-07 DIAGNOSIS — R7402 Elevation of levels of lactic acid dehydrogenase (LDH): Secondary | ICD-10-CM | POA: Insufficient documentation

## 2012-07-07 DIAGNOSIS — Z8619 Personal history of other infectious and parasitic diseases: Secondary | ICD-10-CM | POA: Insufficient documentation

## 2012-07-07 DIAGNOSIS — R7401 Elevation of levels of liver transaminase levels: Secondary | ICD-10-CM | POA: Insufficient documentation

## 2012-07-07 LAB — CBC WITH DIFFERENTIAL/PLATELET
Basophils Absolute: 0.1 10*3/uL (ref 0.0–0.1)
Eosinophils Absolute: 0.3 10*3/uL (ref 0.0–0.7)
Lymphs Abs: 2.9 10*3/uL (ref 0.7–4.0)
MCH: 30.7 pg (ref 26.0–34.0)
MCHC: 33.8 g/dL (ref 30.0–36.0)
MCV: 90.8 fL (ref 78.0–100.0)
Monocytes Absolute: 1.3 10*3/uL — ABNORMAL HIGH (ref 0.1–1.0)
Neutrophils Relative %: 65 % (ref 43–77)
Platelets: 180 10*3/uL (ref 150–400)
RBC: 4.11 MIL/uL — ABNORMAL LOW (ref 4.22–5.81)
RDW: 13.7 % (ref 11.5–15.5)

## 2012-07-07 LAB — COMPREHENSIVE METABOLIC PANEL
ALT: 750 U/L — ABNORMAL HIGH (ref 0–53)
AST: 391 U/L — ABNORMAL HIGH (ref 0–37)
CO2: 26 mEq/L (ref 19–32)
Calcium: 9.1 mg/dL (ref 8.4–10.5)
Creatinine, Ser: 1.01 mg/dL (ref 0.50–1.35)
GFR calc non Af Amer: >90 mL/min (ref >90)
Sodium: 141 mEq/L (ref 135–145)
Total Protein: 7.1 g/dL (ref 6.0–8.3)

## 2012-07-07 MED ORDER — FENTANYL CITRATE 0.05 MG/ML IJ SOLN
100.0000 ug | Freq: Once | INTRAMUSCULAR | Status: AC
  Administered 2012-07-08: 100 ug via INTRAVENOUS
  Filled 2012-07-07 (×2): qty 2

## 2012-07-07 MED ORDER — KETAMINE HCL 10 MG/ML IJ SOLN: 0.3000 mg/kg | Freq: Once | INTRAMUSCULAR | Status: DC

## 2012-07-07 MED ORDER — ONDANSETRON HCL 4 MG/2ML IJ SOLN
4.0000 mg | Freq: Once | INTRAMUSCULAR | Status: AC
  Administered 2012-07-07: 4 mg via INTRAVENOUS
  Filled 2012-07-07: qty 2

## 2012-07-07 MED ORDER — IOHEXOL 300 MG/ML  SOLN: 50.0000 mL | Freq: Once | INTRAMUSCULAR | Status: AC | PRN

## 2012-07-07 MED ORDER — LORAZEPAM 2 MG/ML IJ SOLN
1.0000 mg | Freq: Once | INTRAMUSCULAR | Status: AC
  Administered 2012-07-07: 1 mg via INTRAVENOUS
  Filled 2012-07-07: qty 1

## 2012-07-07 MED ORDER — GI COCKTAIL ~~LOC~~
30.0000 mL | Freq: Once | ORAL | Status: AC
  Administered 2012-07-07: 30 mL via ORAL
  Filled 2012-07-07: qty 30

## 2012-07-07 MED ORDER — IOHEXOL 300 MG/ML  SOLN
50.0000 mL | Freq: Once | INTRAMUSCULAR | Status: AC | PRN
  Administered 2012-07-07: 50 mL via ORAL

## 2012-07-07 MED ORDER — SODIUM CHLORIDE 0.9 % IV BOLUS (SEPSIS)
1000.0000 mL | Freq: Once | INTRAVENOUS | Status: AC
  Administered 2012-07-07: 1000 mL via INTRAVENOUS

## 2012-07-07 MED ORDER — FENTANYL CITRATE 0.05 MG/ML IJ SOLN
100.0000 ug | Freq: Once | INTRAMUSCULAR | Status: AC
  Administered 2012-07-07: 100 ug via INTRAVENOUS
  Filled 2012-07-07: qty 2

## 2012-07-07 NOTE — ED Provider Notes (Signed)
History     CSN: [id number]  Arrival date & time 07/07/12  2032   First MD Initiated Contact with Patient 07/07/12 2036      No chief complaint on file.   (Consider location/radiation/quality/duration/timing/severity/associated sxs/prior treatment) HPI Comments: 27 year old male with a past medical history of gastritis, hepatitis C and pancreatitis presents emergency department complaining of continuing midepigastric abdominal pain, nausea and vomiting since being discharged from ED yesterday. This is patient's fourth visit in the past 3 days for the same. Pain is non-radiating, constant, dull and severe rated 10/10. States symptoms have not changed since yesterday. Tried eating a salad and vomited. Denies recent alcohol intake. Denies fever, chills, bowel changes, urinary symptoms, chest pain, sob. At yesterdays visit, WBC 12.6, AST 313, ALT 628 and alk phos 124. Two days prior WBC 16/1, AST 235, ALT 577, alk phos 149. After reading through patient's chart, he has requested a lot of pain medication and there has been concern for abuse. Note prior to discharge yesterday he was able to tolerate PO and was resting comfortably. He has f/u with GI on 4/16 in [place].  The history is provided by the patient.    Past Medical History  Diagnosis Date  . Pancreatitis   . Hepatitis C   . Gastritis     No past surgical history on file.  Family History  Problem Relation Age of Onset  . CAD Father     History  Substance Use Topics  . Smoking status: Heavy Tobacco Smoker -- 1.00 packs/day  . Smokeless tobacco: Not on file  . Alcohol Use: No      Review of Systems  Constitutional: Negative for fever and chills.  Respiratory: Negative for shortness of breath.   Cardiovascular: Negative for chest pain.  Gastrointestinal: Positive for nausea, vomiting and abdominal pain.  Musculoskeletal: Negative for back pain.  All other systems reviewed and are negative.    Allergies  Review  of patient's allergies indicates no known allergies.  Home Medications   Current Outpatient Rx  Name  Route  Sig  Dispense  Refill  . HYDROcodone-acetaminophen (NORCO/VICODIN) 5-325 MG per tablet   Oral   Take 1 tablet by mouth every 6 (six) hours as needed for pain.   10 tablet   0   . pantoprazole (PROTONIX) 40 MG tablet   Oral   Take 1 tablet (40 mg total) by mouth daily.   30 tablet   0   . promethazine (PHENERGAN) 25 MG tablet   Oral   Take 1 tablet (25 mg total) by mouth every 6 (six) hours as needed for nausea.   10 tablet   0     BP 162/107  Pulse 95  Temp(Src) 98.2 F (36.8 C) (Oral)  Resp 16  Ht 6\' 2"  (1.88 m)  Wt 200 lb (90.719 kg)  BMI 25.67 kg/m2  SpO2 97%  Physical Exam  Nursing note and vitals reviewed. Constitutional: He is oriented to person, place, and time. He appears well-developed and well-nourished. No distress.  HENT:  Head: Normocephalic and atraumatic.  Mouth/Throat: Oropharynx is clear and moist.  Eyes: Conjunctivae are normal.  Neck: Normal range of motion. Neck supple.  Cardiovascular: Normal rate, regular rhythm and normal heart sounds.   Pulmonary/Chest: Effort normal and breath sounds normal. No respiratory distress.  Abdominal: Soft. Normal appearance and bowel sounds are normal. He exhibits no distension. There is tenderness in the epigastric area. There is no rigidity, no rebound and no guarding.  No peritoneal signs.  Musculoskeletal: Normal range of motion. He exhibits no edema.  Neurological: He is alert and oriented to person, place, and time.  Skin: Skin is warm and dry. He is not diaphoretic.  Psychiatric: He has a normal mood and affect. His behavior is normal.    ED Course  Procedures (including critical care time)  Labs Reviewed  CBC WITH DIFFERENTIAL  COMPREHENSIVE METABOLIC PANEL  LIPASE, BLOOD   Ct Abdomen Pelvis W Contrast  07/08/2012  *RADIOLOGY REPORT*  Clinical Data: Abdominal pain; leukocytosis.  CT  ABDOMEN AND PELVIS WITH CONTRAST  Technique:  Multidetector CT imaging of the abdomen and pelvis was performed following the standard protocol during bolus administration of intravenous contrast.  Contrast: OMNIPAQUE IOHEXOL 300 MG/ML  SOLN  Comparison: CT of the abdomen and pelvis performed 05/11/2012; MRCP and abdominal ultrasound performed 06/11/2012  Findings: The visualized lung bases are clear.  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  The liver is otherwise grossly unremarkable.  The spleen is within normal limits.  There is suggestion of trace right cholecystic fluid, of uncertain significance.  The gallbladder is otherwise unremarkable in appearance.  The pancreas and adrenal glands are unremarkable.  Slight stable prominence of the nodes in the porta hepatis may reflect the patient's history of hepatitis C.  The kidneys are unremarkable in appearance.  There is no evidence of hydronephrosis.  No renal or ureteral stones are seen.  No perinephric stranding is appreciated.  No free fluid is identified.  The small bowel is unremarkable in appearance.  The stomach is within normal limits.  No acute vascular abnormalities are seen.  The appendix is normal in caliber and contains minimal contrast, without evidence for appendicitis.  Contrast progresses to the level of the mid transverse colon.  The colon is unremarkable in appearance.  The bladder is mildly distended and grossly unremarkable.  The prostate remains normal in size.  No inguinal lymphadenopathy is seen.  No acute osseous abnormalities are identified.  IMPRESSION:  1.  No acute abnormality seen within the abdomen or pelvis. 2.  Trace pericholecystic fluid, new from prior studies and of uncertain significance; the gallbladder is otherwise unremarkable. 3.  Stable appearance to known small hepatic hemangiomas. 4.  Slight stable prominence of the nodes in the porta hepatis may reflect the patient's history of  hepatitis C.   Original Report Authenticated By: Tonia Ghent, M.D.      1. Transaminitis   2. Abdominal pain       MDM  27 y/o male with abdominal pain, n/v. Seen in ED yesterday and two other times prior in past 3 days. Patient immediately requested 2mg  dilaudid during initial examination. After looking through chart, there has been concern for narcotic abuse in ED. Will obtain labs, give fluids, zofran and GI cocktail.   9:48 PM Patient walked into ED work room to request pain medication. He did the same yesterday. After graphing patient AST over the past 2 months, it peaked very high March 11, decreased significantly, and is beginning to climb back up high again. AST today 391, ALT 750. Case has been discussed with Dr. Radford Pax who checked AST:ALT ratio which is 0.52 which is concerning for pancreatitis due to drug abuse, toxins or infection. Will give fentanyl and obtain CT abdomen/pelvis. Last CT was done 05/11/2012.   2:03 AM CT scan negative for any acute abnormality. I spoke with [facility] Dr. Julian Reil for admission who made it very clear he  would not give patient any IV narcotics. Dr. Julian Reil states patient has been going back and forth between [place], [place] and [facility] for pain medication. Also escorted off [facility] after wandering the halls demanding pain medication. This trend in his liver enzymes is chronic and he has had numerous workups for the same at both [place] and [facility]. He was supposed to f/u with GI at [facility] in February which he did not. Checking tylenol level. Patient continues to wander hall requesting pain medication. States he does not want to stay in the hospital if he does not have his pain controlled. Patient is sitting up in bed in NAD without guarding his abdomen raising his voice when he is talking about this.  3:43 AM Dr. Kerby Nora went to talk to patient to discuss admission, however when he was told he would not receive IV narcotics he was argumentative with him and nurses and  left the ED.  Trevor Mace, PA-C 07/08/12 [phone]

## 2012-07-07 NOTE — ED Notes (Signed)
Ct notified pt completed CM 

## 2012-07-07 NOTE — ED Notes (Addendum)
Pt c/o abd pain for years. Pain is rated10/10

## 2012-07-08 ENCOUNTER — Encounter (HOSPITAL_COMMUNITY): Payer: Self-pay

## 2012-07-08 MED ORDER — HYDROMORPHONE HCL PF 1 MG/ML IJ SOLN
1.0000 mg | Freq: Once | INTRAMUSCULAR | Status: AC
  Administered 2012-07-08: 1 mg via INTRAVENOUS
  Filled 2012-07-08: qty 1

## 2012-07-08 MED ORDER — FENTANYL CITRATE 0.05 MG/ML IJ SOLN
100.0000 ug | Freq: Once | INTRAMUSCULAR | Status: AC
  Administered 2012-07-08: 100 ug via INTRAVENOUS

## 2012-07-08 MED ORDER — IOHEXOL 300 MG/ML  SOLN
100.0000 mL | Freq: Once | INTRAMUSCULAR | Status: AC | PRN
  Administered 2012-07-08: 100 mL via INTRAVENOUS

## 2012-07-08 NOTE — ED Notes (Signed)
Patient transported to CT 

## 2012-07-08 NOTE — ED Provider Notes (Signed)
Medical screening examination/treatment/procedure(s) were performed by non-physician practitioner and as supervising physician I was immediately available for consultation/collaboration.   Nelia Shi, MD 07/08/12 [phone]

## 2012-07-08 NOTE — ED Notes (Signed)
Pt was escorted by security for grounds for becoming verbally abusive with staff and not agreeing with treatment.

## 2012-09-12 ENCOUNTER — Emergency Department: Payer: Self-pay | Admitting: Emergency Medicine

## 2012-09-12 LAB — URINALYSIS, COMPLETE
Bilirubin,UR: NEGATIVE
Blood: NEGATIVE
Ketone: NEGATIVE
RBC,UR: <1 /HPF (ref 0–5)
Specific Gravity: 1.004 (ref 1.003–1.030)
Squamous Epithelial: <1
WBC UR: <1 /HPF (ref 0–5)

## 2012-09-12 LAB — CBC
HCT: 39.6 % — ABNORMAL LOW (ref 40.0–52.0)
MCHC: 33.8 g/dL (ref 32.0–36.0)
MCV: 91 fL (ref 80–100)
Platelet: 170 10*3/uL (ref 150–440)
RBC: 4.35 10*6/uL — ABNORMAL LOW (ref 4.40–5.90)
WBC: 11.1 10*3/uL — ABNORMAL HIGH (ref 3.8–10.6)

## 2012-09-12 LAB — COMPREHENSIVE METABOLIC PANEL
Albumin: 3.9 g/dL (ref 3.4–5.0)
Alkaline Phosphatase: 99 U/L (ref 50–136)
Anion Gap: 5 — ABNORMAL LOW (ref 7–16)
Bilirubin,Total: 0.7 mg/dL (ref 0.2–1.0)
Chloride: 105 mmol/L (ref 98–107)
Co2: 29 mmol/L (ref 21–32)
Creatinine: 1.18 mg/dL (ref 0.60–1.30)
Glucose: 98 mg/dL (ref 65–99)
Osmolality: 275 (ref 275–301)
Potassium: 3.2 mmol/L — ABNORMAL LOW (ref 3.5–5.1)
SGOT(AST): 45 U/L — ABNORMAL HIGH (ref 15–37)
SGPT (ALT): 114 U/L — ABNORMAL HIGH (ref 12–78)
Sodium: 139 mmol/L (ref 136–145)
Total Protein: 7.6 g/dL (ref 6.4–8.2)

## 2012-09-12 LAB — LIPASE, BLOOD: Lipase: 78 U/L (ref 73–393)

## 2012-09-27 ENCOUNTER — Emergency Department: Payer: Self-pay | Admitting: Emergency Medicine

## 2012-09-27 LAB — COMPREHENSIVE METABOLIC PANEL
Alkaline Phosphatase: 120 U/L (ref 50–136)
BUN: 13 mg/dL (ref 7–18)
Chloride: 106 mmol/L (ref 98–107)
Co2: 30 mmol/L (ref 21–32)
Creatinine: 1.02 mg/dL (ref 0.60–1.30)
EGFR (African American): >60
EGFR (Non-African Amer.): >60
Glucose: 119 mg/dL — ABNORMAL HIGH (ref 65–99)
Potassium: 3.8 mmol/L (ref 3.5–5.1)
SGPT (ALT): 133 U/L — ABNORMAL HIGH (ref 12–78)

## 2012-09-27 LAB — CBC
HCT: 43.1 % (ref 40.0–52.0)
HGB: 14.4 g/dL (ref 13.0–18.0)
MCH: 30.6 pg (ref 26.0–34.0)
MCHC: 33.3 g/dL (ref 32.0–36.0)
MCV: 92 fL (ref 80–100)
Platelet: 172 10*3/uL (ref 150–440)
RDW: 13.3 % (ref 11.5–14.5)
WBC: 15.1 10*3/uL — ABNORMAL HIGH (ref 3.8–10.6)

## 2012-10-31 ENCOUNTER — Encounter (HOSPITAL_COMMUNITY): Payer: Self-pay | Admitting: Emergency Medicine

## 2012-10-31 ENCOUNTER — Emergency Department (HOSPITAL_COMMUNITY)
Admission: EM | Admit: 2012-10-31 | Discharge: 2012-10-31 | Disposition: A | Discharge status: Home / Self Care | Payer: Self-pay | Attending: Emergency Medicine | Admitting: Emergency Medicine

## 2012-10-31 DIAGNOSIS — R112 Nausea with vomiting, unspecified: Secondary | ICD-10-CM | POA: Insufficient documentation

## 2012-10-31 DIAGNOSIS — Z8719 Personal history of other diseases of the digestive system: Secondary | ICD-10-CM | POA: Insufficient documentation

## 2012-10-31 DIAGNOSIS — Z8619 Personal history of other infectious and parasitic diseases: Secondary | ICD-10-CM | POA: Insufficient documentation

## 2012-10-31 DIAGNOSIS — R109 Unspecified abdominal pain: Secondary | ICD-10-CM | POA: Insufficient documentation

## 2012-10-31 DIAGNOSIS — D72829 Elevated white blood cell count, unspecified: Secondary | ICD-10-CM | POA: Insufficient documentation

## 2012-10-31 DIAGNOSIS — Z79899 Other long term (current) drug therapy: Secondary | ICD-10-CM | POA: Insufficient documentation

## 2012-10-31 DIAGNOSIS — F17200 Nicotine dependence, unspecified, uncomplicated: Secondary | ICD-10-CM | POA: Insufficient documentation

## 2012-10-31 DIAGNOSIS — R945 Abnormal results of liver function studies: Secondary | ICD-10-CM | POA: Insufficient documentation

## 2012-10-31 LAB — URINALYSIS W MICROSCOPIC + REFLEX CULTURE
Leukocytes, UA: NEGATIVE
Nitrite: NEGATIVE
Specific Gravity, Urine: 1.02 (ref 1.005–1.030)
pH: 6.5 (ref 5.0–8.0)

## 2012-10-31 LAB — CBC WITH DIFFERENTIAL/PLATELET
Basophils Relative: 0 % (ref 0–1)
Eosinophils Absolute: 0.1 10*3/uL (ref 0.0–0.7)
Eosinophils Relative: 1 % (ref 0–5)
Hemoglobin: 13.8 g/dL (ref 13.0–17.0)
MCH: 30.8 pg (ref 26.0–34.0)
MCHC: 34 g/dL (ref 30.0–36.0)
Monocytes Absolute: 0.8 10*3/uL (ref 0.1–1.0)
Monocytes Relative: 6 % (ref 3–12)
Neutrophils Relative %: 73 % (ref 43–77)

## 2012-10-31 LAB — COMPREHENSIVE METABOLIC PANEL
Albumin: 4.4 g/dL (ref 3.5–5.2)
BUN: 11 mg/dL (ref 6–23)
Calcium: 9.9 mg/dL (ref 8.4–10.5)
Creatinine, Ser: 0.94 mg/dL (ref 0.50–1.35)
Potassium: 3.9 mEq/L (ref 3.5–5.1)
Total Protein: 7.9 g/dL (ref 6.0–8.3)

## 2012-10-31 LAB — LIPASE, BLOOD: Lipase: 15 U/L (ref 11–59)

## 2012-10-31 LAB — RAPID URINE DRUG SCREEN, HOSP PERFORMED: Opiates: NOT DETECTED

## 2012-10-31 MED ORDER — SODIUM CHLORIDE 0.9 % IV BOLUS (SEPSIS)
1000.0000 mL | Freq: Once | INTRAVENOUS | Status: AC
  Administered 2012-10-31: 1000 mL via INTRAVENOUS

## 2012-10-31 MED ORDER — PROMETHAZINE HCL 25 MG RE SUPP: 25.0000 mg | Freq: Four times a day (QID) | RECTAL | Status: DC | PRN

## 2012-10-31 MED ORDER — HYDROMORPHONE HCL PF 2 MG/ML IJ SOLN
2.0000 mg | Freq: Once | INTRAMUSCULAR | Status: AC
  Administered 2012-10-31: 2 mg via INTRAVENOUS
  Filled 2012-10-31: qty 1

## 2012-10-31 MED ORDER — PROMETHAZINE HCL 25 MG/ML IJ SOLN
12.5000 mg | Freq: Once | INTRAMUSCULAR | Status: AC
  Administered 2012-10-31: 12.5 mg via INTRAVENOUS
  Filled 2012-10-31: qty 1

## 2012-10-31 NOTE — ED Provider Notes (Signed)
CSN: [id number]     Arrival date & time 10/31/12  2151 History     First MD Initiated Contact with Patient 10/31/12 2201     Chief Complaint  Patient presents with  . Abdominal Pain   (Consider location/radiation/quality/duration/timing/severity/associated sxs/prior Treatment) HPI Albert Peters is a 27 y.o. male who presents to ED with complaint of abdominal pain. Pt reports history of pancreatitis and prior alcohol abuse. States this pain started today. Admits to nausea, vomiting, unable to keep anything down. States did not try any medications at home. Pt is followed by GI specialist at [facility]. Pt denies fever, chills, hematemesis, blood in stool. No diarrhea. No prior abdominal surgeries.    Past Medical History  Diagnosis Date  . Pancreatitis   . Hepatitis C   . Gastritis    History reviewed. No pertinent past surgical history. Family History  Problem Relation Age of Onset  . CAD Father    History  Substance Use Topics  . Smoking status: Heavy Tobacco Smoker -- 1.00 packs/day  . Smokeless tobacco: Not on file  . Alcohol Use: No    Review of Systems  Constitutional: Negative for fever and chills.  HENT: Negative for neck pain and neck stiffness.   Respiratory: Negative.   Cardiovascular: Negative.   Gastrointestinal: Positive for nausea, vomiting and abdominal pain.  Genitourinary: Negative for dysuria and flank pain.  Musculoskeletal: Negative.   Skin: Negative.   Neurological: Negative for dizziness, weakness and headaches.    Allergies  Shellfish allergy  Home Medications   Current Outpatient Rx  Name  Route  Sig  Dispense  Refill  . omeprazole (PRILOSEC) 20 MG capsule   Oral   Take 20 mg by mouth daily.          BP 168/104  Pulse 77  Temp(Src) 98.4 F (36.9 C) (Oral)  Resp 22  SpO2 100% Physical Exam  Nursing note and vitals reviewed. Constitutional: He appears well-developed and well-nourished.  Pt is screaming in pain, rolling around in bed,  vomiting  HENT:  Head: Normocephalic.  Eyes: Conjunctivae are normal.  Neck: Neck supple.  Cardiovascular: Normal rate, regular rhythm and normal heart sounds.   Pulmonary/Chest: Effort normal and breath sounds normal. No respiratory distress. He has no wheezes. He has no rales.  Abdominal: Soft. Bowel sounds are normal. He exhibits no distension. There is tenderness. There is no rebound and no guarding.  Diffuse tenderness  Neurological: He is alert.  Skin: Skin is warm and dry.    ED Course   Procedures (including critical care time) Results for orders placed during the hospital encounter of 10/31/12  CBC WITH DIFFERENTIAL      Result Value Range   WBC 12.7 (*) 4.0 - 10.5 K/uL   RBC 4.48  4.22 - 5.81 MIL/uL   Hemoglobin 13.8  13.0 - 17.0 g/dL   HCT 40.9  81.1 - 91.4 %   MCV 90.6  78.0 - 100.0 fL   MCH 30.8  26.0 - 34.0 pg   MCHC 34.0  30.0 - 36.0 g/dL   RDW 78.2  95.6 - 21.3 %   Platelets 172  150 - 400 K/uL   Neutrophils Relative % 73  43 - 77 %   Neutro Abs 9.3 (*) 1.7 - 7.7 K/uL   Lymphocytes Relative 19  12 - 46 %   Lymphs Abs 2.4  0.7 - 4.0 K/uL   Monocytes Relative 6  3 - 12 %   Monocytes Absolute 0.8  0.1 - 1.0 K/uL   Eosinophils Relative 1  0 - 5 %   Eosinophils Absolute 0.1  0.0 - 0.7 K/uL   Basophils Relative 0  0 - 1 %   Basophils Absolute 0.0  0.0 - 0.1 K/uL  COMPREHENSIVE METABOLIC PANEL      Result Value Range   Sodium 140  135 - 145 mEq/L   Potassium 3.9  3.5 - 5.1 mEq/L   Chloride 101  96 - 112 mEq/L   CO2 26  19 - 32 mEq/L   Glucose, Bld 111 (*) 70 - 99 mg/dL   BUN 11  6 - 23 mg/dL   Creatinine, Ser 9.60  0.50 - 1.35 mg/dL   Calcium 9.9  8.4 - 45.4 mg/dL   Total Protein 7.9  6.0 - 8.3 g/dL   Albumin 4.4  3.5 - 5.2 g/dL   AST 44 (*) 0 - 37 U/L   ALT 72 (*) 0 - 53 U/L   Alkaline Phosphatase 80  39 - 117 U/L   Total Bilirubin 0.6  0.3 - 1.2 mg/dL   GFR calc non Af Amer >90  >90 mL/min   GFR calc Af Amer >90  >90 mL/min  LIPASE, BLOOD       Result Value Range   Lipase 15  11 - 59 U/L   No results found.   1. Abdominal pain   2. Elevated LFTs   3. Leukocytosis     MDM  Pt very dramatic during examination. He is screaming, hard to obtain history. When distracted however, he stops screaming and rolling around in bed, and then continues again. Pt does have history of frequent visits here in ED for the same, also was able to look up recent records, pt with numerous visits to [facility] ED, as well as [facility] ED with same complaints. Pt requested medication from me stating "It only works when you give me 2mg  of dilaudid, fluids, and phenergan."    11:48 PM Pt walking around ED stating he is in pain and needs more pain medications. Pt is also accusing nurse of infiltrating IV when Phenergan was given, states "I didn't get any of it." Pt's abdomen is soft. His HR is normal. Doubt acute abdomen based on temperature, HR, pt is wakling around with no difficulty. Explained to the pt he will be discharged home. Labs at baseline. Phenergan suppositories. Follow up with gi doctor.   Filed Vitals:   10/31/12 2222  BP: 168/104  Pulse: 77  Temp: 98.4 F (36.9 C)  TempSrc: Oral  Resp: 22  SpO2: 100%      Ranon Coven A Jaimarie Rapozo, PA-C 10/31/12 2352

## 2012-10-31 NOTE — ED Notes (Signed)
Onset mid-abdominal pain, nausea and vomiting a couple hrs ago. Pt has hx of pancreatitis and gastritis, states this feels the same.

## 2012-11-01 ENCOUNTER — Emergency Department (HOSPITAL_COMMUNITY)
Admission: EM | Admit: 2012-11-01 | Discharge: 2012-11-02 | Disposition: A | Discharge status: Home / Self Care | Payer: Self-pay | Attending: Emergency Medicine | Admitting: Emergency Medicine

## 2012-11-01 ENCOUNTER — Encounter (HOSPITAL_COMMUNITY): Payer: Self-pay | Admitting: Emergency Medicine

## 2012-11-01 DIAGNOSIS — R112 Nausea with vomiting, unspecified: Secondary | ICD-10-CM | POA: Insufficient documentation

## 2012-11-01 DIAGNOSIS — R1930 Abdominal rigidity, unspecified site: Secondary | ICD-10-CM | POA: Insufficient documentation

## 2012-11-01 DIAGNOSIS — F17200 Nicotine dependence, unspecified, uncomplicated: Secondary | ICD-10-CM | POA: Insufficient documentation

## 2012-11-01 DIAGNOSIS — R1013 Epigastric pain: Secondary | ICD-10-CM | POA: Insufficient documentation

## 2012-11-01 DIAGNOSIS — Z8619 Personal history of other infectious and parasitic diseases: Secondary | ICD-10-CM | POA: Insufficient documentation

## 2012-11-01 DIAGNOSIS — K861 Other chronic pancreatitis: Secondary | ICD-10-CM | POA: Insufficient documentation

## 2012-11-01 DIAGNOSIS — R197 Diarrhea, unspecified: Secondary | ICD-10-CM | POA: Insufficient documentation

## 2012-11-01 DIAGNOSIS — Z880 Allergy status to penicillin: Secondary | ICD-10-CM | POA: Insufficient documentation

## 2012-11-01 MED ORDER — PANTOPRAZOLE SODIUM 40 MG IV SOLR
40.0000 mg | Freq: Once | INTRAVENOUS | Status: AC
  Administered 2012-11-01: 40 mg via INTRAVENOUS
  Filled 2012-11-01: qty 40

## 2012-11-01 MED ORDER — SODIUM CHLORIDE 0.9 % IV BOLUS (SEPSIS)
1000.0000 mL | Freq: Once | INTRAVENOUS | Status: AC
  Administered 2012-11-01: 1000 mL via INTRAVENOUS

## 2012-11-01 MED ORDER — HYDROMORPHONE HCL PF 1 MG/ML IJ SOLN
1.0000 mg | Freq: Once | INTRAMUSCULAR | Status: AC
  Administered 2012-11-01: 1 mg via INTRAVENOUS
  Filled 2012-11-01: qty 1

## 2012-11-01 MED ORDER — ONDANSETRON HCL 4 MG/2ML IJ SOLN
4.0000 mg | Freq: Once | INTRAMUSCULAR | Status: AC
  Administered 2012-11-01: 4 mg via INTRAVENOUS
  Filled 2012-11-01: qty 2

## 2012-11-01 NOTE — ED Notes (Signed)
Pt c/o severe upper mid abdominal pain, nausea and vomiting. Pt has been taking phenergan suppositories at home but still can't keep anything down. Pt was here yesterday for the same thing.

## 2012-11-01 NOTE — ED Provider Notes (Signed)
CSN: [id number]     Arrival date & time 11/01/12  2109 History     First MD Initiated Contact with Patient 11/01/12 2242     Chief Complaint  Patient presents with  . Abdominal Pain   (Consider location/radiation/quality/duration/timing/severity/associated sxs/prior Treatment) Patient is a 27 y.o. male presenting with abdominal pain. The history is provided by the patient and medical records. No language interpreter was used.  Abdominal Pain Associated symptoms include abdominal pain, nausea and vomiting. Pertinent negatives include no chest pain, coughing, diaphoresis, fatigue, fever, neck pain, rash or weakness.    Albert Peters is a 27 y.o. male  with a hx of chronic abdominal pain with chronic gastritis and chronic pancreatitis presents to the Emergency Department complaining of gradual, persistent, progressively worsening abdominal pain with associated nausea, vomiting and diarrhea. Nothing makes it better and nothing makes it worse.  Pt denies fever, chills, headache, neck pain, chest pain, weakness, dizziness, syncope, dysuria.  .     Past Medical History  Diagnosis Date  . Pancreatitis   . Hepatitis C   . Gastritis    History reviewed. No pertinent past surgical history. Family History  Problem Relation Age of Onset  . CAD Father    History  Substance Use Topics  . Smoking status: Heavy Tobacco Smoker -- 1.00 packs/day  . Smokeless tobacco: Not on file  . Alcohol Use: No    Review of Systems  Constitutional: Negative for fever, diaphoresis, appetite change, fatigue and unexpected weight change.  HENT: Negative for mouth sores, trouble swallowing, neck pain and neck stiffness.   Respiratory: Negative for cough, chest tightness, shortness of breath, wheezing and stridor.   Cardiovascular: Negative for chest pain and palpitations.  Gastrointestinal: Positive for nausea, vomiting, abdominal pain and diarrhea. Negative for constipation, blood in stool, abdominal distention  and rectal pain.  Genitourinary: Negative for dysuria, urgency, frequency, hematuria, flank pain and difficulty urinating.  Musculoskeletal: Negative for back pain.  Skin: Negative for rash.  Neurological: Negative for weakness.  Hematological: Negative for adenopathy.  Psychiatric/Behavioral: Negative for confusion.  All other systems reviewed and are negative.    Allergies  Penicillin g; Shellfish allergy; and Sulfa antibiotics  Home Medications   Current Outpatient Rx  Name  Route  Sig  Dispense  Refill  . promethazine (PHENERGAN) 25 MG suppository   Rectal   Place 1 suppository (25 mg total) rectally every 6 (six) hours as needed for nausea.   6 each   0    BP 112/65  Pulse 73  Temp(Src) 98.9 F (37.2 C) (Oral)  Resp 20  SpO2 100% Physical Exam  Nursing note and vitals reviewed. Constitutional: He appears well-developed and well-nourished.  HENT:  Head: Normocephalic and atraumatic.  Mouth/Throat: Oropharynx is clear and moist.  Eyes: Conjunctivae are normal. Pupils are equal, round, and reactive to light. No scleral icterus.  Neck: Normal range of motion.  Cardiovascular: Normal rate, regular rhythm, normal heart sounds and intact distal pulses.   No murmur heard. Pulmonary/Chest: Effort normal and breath sounds normal.  Abdominal: Soft. Normal appearance and bowel sounds are normal. He exhibits no distension and no mass. There is tenderness in the epigastric area. There is guarding. There is no rebound.    Musculoskeletal: Normal range of motion. He exhibits no tenderness.  Lymphadenopathy:    He has no cervical adenopathy.  Neurological: He is alert. He exhibits normal muscle tone. Coordination normal.  Skin: Skin is warm and dry. No rash noted. No erythema.  Psychiatric: He has a normal mood and affect.    ED Course   Procedures (including critical care time)  Labs Reviewed  CBC - Abnormal; Notable for the following:    WBC 11.6 (*)    RBC 4.11 (*)     Hemoglobin 12.7 (*)    HCT 36.1 (*)    All other components within normal limits  URINALYSIS, ROUTINE W REFLEX MICROSCOPIC - Abnormal; Notable for the following:    Color, Urine AMBER (*)    Ketones, ur 40 (*)    All other components within normal limits  BASIC METABOLIC PANEL  LIPASE, BLOOD   No results found. 1. Abdominal discomfort, epigastric     MDM  Albert Peters name has been seen here many times for the same epigastric complaint including last night.  Patient is nontoxic, nonseptic appearing, in no apparent distress.  Patient's pain and other symptoms adequately managed in emergency department.  Fluid bolus given.  Labs and vitals reviewed.  Patient does not meet the SIRS or Sepsis criteria.  On repeat exam patient does not have a surgical abdomin and there are no peritoneal signs.  No indication of appendicitis, bowel obstruction, bowel perforation, cholecystitis, diverticulitis.  Patient discharged home with symptomatic treatment and given strict instructions for follow-up with their primary care physician.  I have also discussed reasons to return immediately to the ER.  Patient expresses understanding and agrees with plan.      Dahlia Client Yakov Bergen, PA-C 11/02/12 0210

## 2012-11-01 NOTE — ED Provider Notes (Signed)
Medical screening examination/treatment/procedure(s) were conducted as a shared visit with non-physician practitioner(s) or resident  and myself.  I personally evaluated the patient during the encounter and agree with the findings and plan unless otherwise indicated. Recurrent issue.  Patient displaying drug seeking behavior. Benign abd exam... Fup discussed. No narcotics on dc.  Enid Skeens, MD 11/01/12 [phone]

## 2012-11-02 LAB — BASIC METABOLIC PANEL
CO2: 28 mEq/L (ref 19–32)
Calcium: 9.7 mg/dL (ref 8.4–10.5)
Chloride: 98 mEq/L (ref 96–112)
Creatinine, Ser: 1 mg/dL (ref 0.50–1.35)
Glucose, Bld: 99 mg/dL (ref 70–99)
Sodium: 137 mEq/L (ref 135–145)

## 2012-11-02 LAB — URINALYSIS, ROUTINE W REFLEX MICROSCOPIC
Glucose, UA: NEGATIVE mg/dL
Ketones, ur: 40 mg/dL — AB
Leukocytes, UA: NEGATIVE
Nitrite: NEGATIVE
Specific Gravity, Urine: 1.024 (ref 1.005–1.030)
pH: 7.5 (ref 5.0–8.0)

## 2012-11-02 LAB — CBC
Hemoglobin: 12.7 g/dL — ABNORMAL LOW (ref 13.0–17.0)
MCH: 30.9 pg (ref 26.0–34.0)
MCV: 87.8 fL (ref 78.0–100.0)
RBC: 4.11 MIL/uL — ABNORMAL LOW (ref 4.22–5.81)
WBC: 11.6 10*3/uL — ABNORMAL HIGH (ref 4.0–10.5)

## 2012-11-02 MED ORDER — GI COCKTAIL ~~LOC~~
30.0000 mL | Freq: Once | ORAL | Status: AC
  Administered 2012-11-02: 30 mL via ORAL
  Filled 2012-11-02: qty 30

## 2012-11-02 MED ORDER — ONDANSETRON HCL 4 MG/2ML IJ SOLN
4.0000 mg | Freq: Once | INTRAMUSCULAR | Status: AC
  Administered 2012-11-02: 4 mg via INTRAVENOUS
  Filled 2012-11-02: qty 2

## 2012-11-02 MED ORDER — HYDROMORPHONE HCL PF 1 MG/ML IJ SOLN
1.0000 mg | Freq: Once | INTRAMUSCULAR | Status: AC
  Administered 2012-11-02: 1 mg via INTRAVENOUS
  Filled 2012-11-02: qty 1

## 2012-11-02 NOTE — ED Provider Notes (Signed)
Medical screening examination/treatment/procedure(s) were performed by non-physician practitioner and as supervising physician I was immediately available for consultation/collaboration.  Ethelda Chick, MD 11/02/12 [phone]

## 2012-12-27 ENCOUNTER — Encounter (HOSPITAL_COMMUNITY): Payer: Self-pay

## 2012-12-27 ENCOUNTER — Emergency Department (HOSPITAL_COMMUNITY)
Admission: EM | Admit: 2012-12-27 | Discharge: 2012-12-27 | Disposition: A | Discharge status: Home / Self Care | Payer: Self-pay | Attending: Emergency Medicine | Admitting: Emergency Medicine

## 2012-12-27 ENCOUNTER — Emergency Department: Payer: Self-pay | Admitting: Emergency Medicine

## 2012-12-27 DIAGNOSIS — B1920 Unspecified viral hepatitis C without hepatic coma: Secondary | ICD-10-CM | POA: Insufficient documentation

## 2012-12-27 DIAGNOSIS — K279 Peptic ulcer, site unspecified, unspecified as acute or chronic, without hemorrhage or perforation: Secondary | ICD-10-CM | POA: Insufficient documentation

## 2012-12-27 DIAGNOSIS — F17200 Nicotine dependence, unspecified, uncomplicated: Secondary | ICD-10-CM | POA: Insufficient documentation

## 2012-12-27 DIAGNOSIS — Z880 Allergy status to penicillin: Secondary | ICD-10-CM | POA: Insufficient documentation

## 2012-12-27 DIAGNOSIS — K859 Acute pancreatitis without necrosis or infection, unspecified: Secondary | ICD-10-CM | POA: Insufficient documentation

## 2012-12-27 LAB — CBC WITH DIFFERENTIAL/PLATELET
HCT: 40.2 % (ref 39.0–52.0)
MCH: 31.7 pg (ref 26.0–34.0)
MCHC: 35.3 g/dL (ref 30.0–36.0)
MCV: 89.7 fL (ref 78.0–100.0)
Platelets: 164 10*3/uL (ref 150–400)
RBC: 4.48 MIL/uL (ref 4.22–5.81)
RDW: 13.5 % (ref 11.5–15.5)
WBC: 14.9 10*3/uL — ABNORMAL HIGH (ref 4.0–10.5)

## 2012-12-27 LAB — BASIC METABOLIC PANEL
BUN: 11 mg/dL (ref 6–23)
CO2: 27 mEq/L (ref 19–32)
Calcium: 9.2 mg/dL (ref 8.4–10.5)
Chloride: 97 mEq/L (ref 96–112)
Creatinine, Ser: 1.03 mg/dL (ref 0.50–1.35)
GFR calc Af Amer: >90 mL/min (ref >90)
GFR calc non Af Amer: >90 mL/min (ref >90)
Potassium: 3 mEq/L — ABNORMAL LOW (ref 3.5–5.1)
Sodium: 135 mEq/L (ref 135–145)

## 2012-12-27 LAB — LIPASE, BLOOD: Lipase: 35 U/L (ref 11–59)

## 2012-12-27 LAB — COMPREHENSIVE METABOLIC PANEL
Bilirubin,Total: 0.5 mg/dL (ref 0.2–1.0)
Co2: 26 mmol/L (ref 21–32)
Creatinine: 1.1 mg/dL (ref 0.60–1.30)
EGFR (Non-African Amer.): >60
Glucose: 127 mg/dL — ABNORMAL HIGH (ref 65–99)
Potassium: 3.5 mmol/L (ref 3.5–5.1)
SGOT(AST): 63 U/L — ABNORMAL HIGH (ref 15–37)
SGPT (ALT): 119 U/L — ABNORMAL HIGH (ref 12–78)
Sodium: 139 mmol/L (ref 136–145)
Total Protein: 8.2 g/dL (ref 6.4–8.2)

## 2012-12-27 LAB — CBC
MCH: 31.6 pg (ref 26.0–34.0)
MCV: 93 fL (ref 80–100)
Platelet: 177 10*3/uL (ref 150–440)
RBC: 4.8 10*6/uL (ref 4.40–5.90)
RDW: 13.8 % (ref 11.5–14.5)

## 2012-12-27 MED ORDER — HYDROCODONE-ACETAMINOPHEN 5-325 MG PO TABS: 1.0000 | ORAL_TABLET | Freq: Four times a day (QID) | ORAL | Status: DC | PRN

## 2012-12-27 MED ORDER — ONDANSETRON HCL 4 MG/2ML IJ SOLN
4.0000 mg | Freq: Once | INTRAMUSCULAR | Status: AC
  Administered 2012-12-27: 4 mg via INTRAVENOUS
  Filled 2012-12-27: qty 2

## 2012-12-27 MED ORDER — HYDROMORPHONE HCL PF 1 MG/ML IJ SOLN
1.0000 mg | Freq: Once | INTRAMUSCULAR | Status: AC
  Administered 2012-12-27: 1 mg via INTRAVENOUS
  Filled 2012-12-27: qty 1

## 2012-12-27 MED ORDER — PANTOPRAZOLE SODIUM 20 MG PO TBEC: 20.0000 mg | DELAYED_RELEASE_TABLET | Freq: Every day | ORAL | Status: DC

## 2012-12-27 MED ORDER — SODIUM CHLORIDE 0.9 % IV BOLUS (SEPSIS)
1000.0000 mL | Freq: Once | INTRAVENOUS | Status: AC
  Administered 2012-12-27: 1000 mL via INTRAVENOUS

## 2012-12-27 MED ORDER — PANTOPRAZOLE SODIUM 40 MG IV SOLR
40.0000 mg | Freq: Once | INTRAVENOUS | Status: AC
  Administered 2012-12-27: 40 mg via INTRAVENOUS
  Filled 2012-12-27: qty 40

## 2012-12-27 MED ORDER — POTASSIUM CHLORIDE CRYS ER 20 MEQ PO TBCR
40.0000 meq | EXTENDED_RELEASE_TABLET | Freq: Once | ORAL | Status: AC
  Administered 2012-12-27: 40 meq via ORAL
  Filled 2012-12-27: qty 2

## 2012-12-27 MED ORDER — GI COCKTAIL ~~LOC~~
30.0000 mL | Freq: Once | ORAL | Status: AC
  Administered 2012-12-27: 30 mL via ORAL
  Filled 2012-12-27: qty 30

## 2012-12-27 NOTE — ED Notes (Signed)
PA at bedside.

## 2012-12-27 NOTE — ED Provider Notes (Signed)
CSN: [id number]     Arrival date & time 12/27/12  1850 History   First MD Initiated Contact with Patient 12/27/12 1915     Chief Complaint  Patient presents with  . Abdominal Pain   (Consider location/radiation/quality/duration/timing/severity/associated sxs/prior Treatment) HPI Comments: Patient presents to the emergency department with chief complaint of upper abdominal pain. He states that he has had this pain before. He states that he thinks that his pancreatitis. He was recently discharged this morning by [facility]. He states that his lipase was not elevated. States that the pain started a day or so ago after he had some pizza. States that he had some heartburn after eating this, and has had the pain ever since. States that the pain is moderate to severe. He denies nausea or vomiting, diarrhea, or constipation.  The history is provided by the patient. No language interpreter was used.    Past Medical History  Diagnosis Date  . Pancreatitis   . Hepatitis C   . Gastritis    History reviewed. No pertinent past surgical history. Family History  Problem Relation Age of Onset  . CAD Father    History  Substance Use Topics  . Smoking status: Heavy Tobacco Smoker -- 1.00 packs/day  . Smokeless tobacco: Not on file  . Alcohol Use: No    Review of Systems  All other systems reviewed and are negative.    Allergies  Penicillin g; Shellfish allergy; and Sulfa antibiotics  Home Medications   Current Outpatient Rx  Name  Route  Sig  Dispense  Refill  . omeprazole (PRILOSEC) 20 MG capsule   Oral   Take 20 mg by mouth daily.         . ondansetron (ZOFRAN) 4 MG tablet   Oral   Take 4 mg by mouth every 8 (eight) hours as needed for nausea.          BP 158/100  Pulse 88  Temp(Src) 98.7 F (37.1 C) (Oral)  Resp 14  SpO2 100% Physical Exam  Nursing note and vitals reviewed. Constitutional: He is oriented to person, place, and time. He appears well-developed and  well-nourished.  HENT:  Head: Normocephalic and atraumatic.  Right Ear: External ear normal.  Left Ear: External ear normal.  Nose: Nose normal.  Mouth/Throat: Oropharynx is clear and moist. No oropharyngeal exudate.  Eyes: Conjunctivae and EOM are normal. Pupils are equal, round, and reactive to light. Right eye exhibits no discharge. Left eye exhibits no discharge. No scleral icterus.  Neck: Normal range of motion. Neck supple. No JVD present.  Cardiovascular: Normal rate, regular rhythm, normal heart sounds and intact distal pulses.  Exam reveals no gallop and no friction rub.   No murmur heard. Pulmonary/Chest: Effort normal and breath sounds normal. No respiratory distress. He has no wheezes. He has no rales. He exhibits no tenderness.  Abdominal: Soft. Bowel sounds are normal. He exhibits no distension and no mass. There is tenderness. There is no rebound and no guarding.  Epigastric region tender to palpation, no other focal abdominal tenderness, no signs of fluid, or peritonitis  Musculoskeletal: Normal range of motion. He exhibits no edema and no tenderness.  Neurological: He is alert and oriented to person, place, and time. He has normal reflexes.  CN 3-12 intact  Skin: Skin is warm and dry.  Psychiatric: He has a normal mood and affect. His behavior is normal. Judgment and thought content normal.    ED Course  Procedures (including critical care time)  Labs Review Labs Reviewed  CBC WITH DIFFERENTIAL  BASIC METABOLIC PANEL  LIPASE, BLOOD   Results for orders placed during the hospital encounter of 12/27/12  CBC WITH DIFFERENTIAL      Result Value Range   WBC 14.9 (*) 4.0 - 10.5 K/uL   RBC 4.48  4.22 - 5.81 MIL/uL   Hemoglobin 14.2  13.0 - 17.0 g/dL   HCT 78.2  95.6 - 21.3 %   MCV 89.7  78.0 - 100.0 fL   MCH 31.7  26.0 - 34.0 pg   MCHC 35.3  30.0 - 36.0 g/dL   RDW 08.6  57.8 - 46.9 %   Platelets 164  150 - 400 K/uL  BASIC METABOLIC PANEL      Result Value Range    Sodium 135  135 - 145 mEq/L   Potassium 3.0 (*) 3.5 - 5.1 mEq/L   Chloride 97  96 - 112 mEq/L   CO2 27  19 - 32 mEq/L   Glucose, Bld 108 (*) 70 - 99 mg/dL   BUN 11  6 - 23 mg/dL   Creatinine, Ser 6.29  0.50 - 1.35 mg/dL   Calcium 9.2  8.4 - 52.8 mg/dL   GFR calc non Af Amer >90  >90 mL/min   GFR calc Af Amer >90  >90 mL/min  LIPASE, BLOOD      Result Value Range   Lipase 35  11 - 59 U/L     MDM   1. PUD (peptic ulcer disease)     Patient with abdominal pain. The pain is in the epigastric region. I suspect that this is likely peptic ulcer disease, and is associated with GERD, and is worsened with eating greasy/spicy foods. Will treat with a GI cocktail, Protonix, and will give one dose of pain medicine. Patient understands and agrees with plan. Will check basic labs, and will reevaluate.  Patient states that he has had this pain before.  It is in the same spot that he always gets it.  He has had 2 CT scans earlier this year.  I do not think that any additional imaging is indicated as the pain is the exact same as in prior episodes.  9:10 PM Patient reports significant improvement with treatment in the emergency department. Will give the patient some protonix to go home with.  Follow-up with GI is recommended.  Patient states that he has follow-up next week.  Potassium is supplemented.  Roxy Horseman, PA-C 12/27/12 2124

## 2012-12-27 NOTE — ED Provider Notes (Signed)
Medical screening examination/treatment/procedure(s) were performed by non-physician practitioner and as supervising physician I was immediately available for consultation/collaboration.  Toy Baker, MD 12/27/12 [phone]

## 2012-12-27 NOTE — ED Notes (Signed)
Bed: UJ81 Expected date:  Expected time:  Means of arrival:  Comments: Abdominal pain

## 2012-12-27 NOTE — ED Notes (Addendum)
Per EMS, Pt c/o upper abdominal pain and emesis x 2days.  Hx of pancreatitis.  Pain score 10/10.  Pt was discharged from [facility] yesterday for same complaint.  Sts "they wouldn't give me any dilaudid."  Vitals are stable.  Denies ETOH use.  Sts he is has been eating spicy food.

## 2012-12-28 ENCOUNTER — Encounter (HOSPITAL_COMMUNITY): Payer: Self-pay

## 2012-12-28 ENCOUNTER — Emergency Department (HOSPITAL_COMMUNITY)
Admission: EM | Admit: 2012-12-28 | Discharge: 2012-12-28 | Disposition: A | Discharge status: Home / Self Care | Payer: Self-pay | Attending: Emergency Medicine | Admitting: Emergency Medicine

## 2012-12-28 DIAGNOSIS — R109 Unspecified abdominal pain: Secondary | ICD-10-CM

## 2012-12-28 DIAGNOSIS — F17200 Nicotine dependence, unspecified, uncomplicated: Secondary | ICD-10-CM | POA: Insufficient documentation

## 2012-12-28 DIAGNOSIS — Z79899 Other long term (current) drug therapy: Secondary | ICD-10-CM | POA: Insufficient documentation

## 2012-12-28 DIAGNOSIS — Z880 Allergy status to penicillin: Secondary | ICD-10-CM | POA: Insufficient documentation

## 2012-12-28 DIAGNOSIS — Z8619 Personal history of other infectious and parasitic diseases: Secondary | ICD-10-CM | POA: Insufficient documentation

## 2012-12-28 DIAGNOSIS — R1013 Epigastric pain: Secondary | ICD-10-CM | POA: Insufficient documentation

## 2012-12-28 DIAGNOSIS — K859 Acute pancreatitis without necrosis or infection, unspecified: Secondary | ICD-10-CM | POA: Insufficient documentation

## 2012-12-28 MED ORDER — SODIUM CHLORIDE 0.9 % IV SOLN: INTRAVENOUS | Status: DC

## 2012-12-28 MED ORDER — PROMETHAZINE HCL 25 MG PO TABS
25.0000 mg | ORAL_TABLET | Freq: Once | ORAL | Status: AC
  Administered 2012-12-28: 25 mg via ORAL
  Filled 2012-12-28: qty 1

## 2012-12-28 MED ORDER — ONDANSETRON HCL 4 MG/2ML IJ SOLN
4.0000 mg | Freq: Once | INTRAMUSCULAR | Status: AC
  Administered 2012-12-28: 4 mg via INTRAVENOUS
  Filled 2012-12-28: qty 2

## 2012-12-28 MED ORDER — HYDROMORPHONE HCL PF 1 MG/ML IJ SOLN
1.0000 mg | Freq: Once | INTRAMUSCULAR | Status: AC
  Administered 2012-12-28: 1 mg via INTRAVENOUS
  Filled 2012-12-28: qty 1

## 2012-12-28 MED ORDER — SODIUM CHLORIDE 0.9 % IV BOLUS (SEPSIS)
1000.0000 mL | Freq: Once | INTRAVENOUS | Status: AC
  Administered 2012-12-28: 1000 mL via INTRAVENOUS

## 2012-12-28 MED ORDER — KETOROLAC TROMETHAMINE 30 MG/ML IJ SOLN
30.0000 mg | Freq: Once | INTRAMUSCULAR | Status: DC
  Filled 2012-12-28: qty 1

## 2012-12-28 MED ORDER — HYDROMORPHONE HCL PF 1 MG/ML IJ SOLN: 1.0000 mg | Freq: Once | INTRAMUSCULAR | Status: DC

## 2012-12-28 MED ORDER — GI COCKTAIL ~~LOC~~
30.0000 mL | Freq: Once | ORAL | Status: AC
  Administered 2012-12-28: 30 mL via ORAL
  Filled 2012-12-28: qty 30

## 2012-12-28 NOTE — ED Notes (Addendum)
Pt c/o abdominal pain and emesis x 3 days.  Pain score 10/10.  Pt was seen at [facility] x 2 days ago and [facility] yesterday for same complaint and diagnosed with PUD.  Sts "I felt good when I left, then I tried to eat this morning and the pain came back."  Sts "I didn't fill my prescriptions, because the [facility] was closed."

## 2012-12-28 NOTE — ED Provider Notes (Signed)
CSN: [id number]     Arrival date & time 12/28/12  0856 History   First MD Initiated Contact with Patient 12/28/12 [phone]     Chief Complaint  Patient presents with  . Abdominal Pain   (Consider location/radiation/quality/duration/timing/severity/associated sxs/prior Treatment) Patient is a 27 y.o. male presenting with abdominal pain. The history is provided by the patient.  Abdominal Pain Pain location:  Epigastric Pain quality: aching and sharp   Pain radiates to:  Does not radiate Pain severity:  Moderate Onset quality:  Gradual Timing:  Constant Progression:  Unchanged Chronicity:  Chronic Context: eating   Relieved by:  Nothing Worsened by:  Nothing tried Associated symptoms: no chest pain, no fever and no shortness of breath   Risk factors comment:  Hx of chronic pancreatitis   Past Medical History  Diagnosis Date  . Pancreatitis   . Hepatitis C   . Gastritis    History reviewed. No pertinent past surgical history. Family History  Problem Relation Age of Onset  . CAD Father    History  Substance Use Topics  . Smoking status: Heavy Tobacco Smoker -- 1.00 packs/day  . Smokeless tobacco: Not on file  . Alcohol Use: No    Review of Systems  Constitutional: Negative for fever.  Respiratory: Negative for shortness of breath.   Cardiovascular: Negative for chest pain.  Gastrointestinal: Positive for abdominal pain.  All other systems reviewed and are negative.    Allergies  Penicillin g; Shellfish allergy; and Sulfa antibiotics  Home Medications   Current Outpatient Rx  Name  Route  Sig  Dispense  Refill  . omeprazole (PRILOSEC) 20 MG capsule   Oral   Take 20 mg by mouth daily.         . ondansetron (ZOFRAN) 4 MG tablet   Oral   Take 4 mg by mouth every 8 (eight) hours as needed for nausea.         Marland Kitchen HYDROcodone-acetaminophen (NORCO/VICODIN) 5-325 MG per tablet   Oral   Take 1 tablet by mouth every 6 (six) hours as needed for pain.   7 tablet    0   . pantoprazole (PROTONIX) 20 MG tablet   Oral   Take 1 tablet (20 mg total) by mouth daily.   30 tablet   0    BP 151/99  Pulse 94  Temp(Src) 98.8 F (37.1 C) (Oral)  Resp 20  SpO2 100% Physical Exam  Nursing note and vitals reviewed. Constitutional: He is oriented to person, place, and time. He appears well-developed and well-nourished. No distress.  HENT:  Head: Normocephalic and atraumatic.  Mouth/Throat: No oropharyngeal exudate.  Eyes: EOM are normal. Pupils are equal, round, and reactive to light.  Neck: Normal range of motion. Neck supple.  Cardiovascular: Normal rate and regular rhythm.  Exam reveals no friction rub.   No murmur heard. Pulmonary/Chest: Effort normal and breath sounds normal. No respiratory distress. He has no wheezes. He has no rales.  Abdominal: He exhibits no distension. There is tenderness (epigastric). There is no rebound.  Musculoskeletal: Normal range of motion. He exhibits no edema.  Neurological: He is alert and oriented to person, place, and time.  Skin: He is not diaphoretic.    ED Course  Procedures (including critical care time) Labs Review Labs Reviewed  CBC  COMPREHENSIVE METABOLIC PANEL  LIPASE, BLOOD   Imaging Review No results found.  MDM   1. Pancreatitis   2. Abdominal pain    73M with hx of  chronic pancreatitis presents with abdominal pain. He was seen in our system yesterday evening, and at [facility] yesterday morning for abdominal pain. He frequents EDs negotiating for narcotics according to outside records. Denies fevers. States he was feeling better last night, but then after eating a small amount of breakfast today, began throwing up again. [facility] here. Easily ambulatory. Patient asking for narcotics. Abdomen with epigastric tenderness, no acute surgical abdomen. No need for CT scan at this time. Labs last night with mild white count (always have a white count in the teens), normal lipase. Will hold off on labs at this  time.  [facility] records from yesterday morning state similar presentations. Will give fluids, anti-emetics, and one dose of dilaudid as patient is in pain. He is not here daily for his abdominal pain. Explained to patient he will receive one dose and one dose only. Patient resting comfortably after one dose of Dilaudid. Stable for discharge   Dagmar Hait, MD 12/28/12 1541

## 2013-02-27 ENCOUNTER — Emergency Department (HOSPITAL_COMMUNITY)
Admission: EM | Admit: 2013-02-27 | Discharge: 2013-02-27 | Disposition: A | Discharge status: Home / Self Care | Payer: Self-pay | Attending: Emergency Medicine | Admitting: Emergency Medicine

## 2013-02-27 ENCOUNTER — Encounter (HOSPITAL_COMMUNITY): Payer: Self-pay | Admitting: Emergency Medicine

## 2013-02-27 DIAGNOSIS — Z880 Allergy status to penicillin: Secondary | ICD-10-CM | POA: Insufficient documentation

## 2013-02-27 DIAGNOSIS — Z8719 Personal history of other diseases of the digestive system: Secondary | ICD-10-CM | POA: Insufficient documentation

## 2013-02-27 DIAGNOSIS — R7989 Other specified abnormal findings of blood chemistry: Secondary | ICD-10-CM | POA: Insufficient documentation

## 2013-02-27 DIAGNOSIS — R109 Unspecified abdominal pain: Secondary | ICD-10-CM

## 2013-02-27 DIAGNOSIS — R1012 Left upper quadrant pain: Secondary | ICD-10-CM | POA: Insufficient documentation

## 2013-02-27 DIAGNOSIS — R112 Nausea with vomiting, unspecified: Secondary | ICD-10-CM | POA: Insufficient documentation

## 2013-02-27 DIAGNOSIS — R1013 Epigastric pain: Secondary | ICD-10-CM | POA: Insufficient documentation

## 2013-02-27 DIAGNOSIS — R6883 Chills (without fever): Secondary | ICD-10-CM | POA: Insufficient documentation

## 2013-02-27 DIAGNOSIS — F17200 Nicotine dependence, unspecified, uncomplicated: Secondary | ICD-10-CM | POA: Insufficient documentation

## 2013-02-27 DIAGNOSIS — Z8619 Personal history of other infectious and parasitic diseases: Secondary | ICD-10-CM | POA: Insufficient documentation

## 2013-02-27 LAB — COMPREHENSIVE METABOLIC PANEL
Albumin: 4.5 g/dL (ref 3.5–5.2)
BUN: 17 mg/dL (ref 6–23)
Calcium: 9.6 mg/dL (ref 8.4–10.5)
Creatinine, Ser: 0.94 mg/dL (ref 0.50–1.35)
GFR calc Af Amer: >90 mL/min (ref >90)
Glucose, Bld: 101 mg/dL — ABNORMAL HIGH (ref 70–99)
Potassium: 3.6 mEq/L (ref 3.5–5.1)
Sodium: 136 mEq/L (ref 135–145)
Total Protein: 8.4 g/dL — ABNORMAL HIGH (ref 6.0–8.3)

## 2013-02-27 LAB — CBC WITH DIFFERENTIAL/PLATELET
Basophils Absolute: 0.2 10*3/uL — ABNORMAL HIGH (ref 0.0–0.1)
Basophils Relative: 1 % (ref 0–1)
Eosinophils Absolute: 0 10*3/uL (ref 0.0–0.7)
HCT: 43 % (ref 39.0–52.0)
Hemoglobin: 15.5 g/dL (ref 13.0–17.0)
Lymphocytes Relative: 13 % (ref 12–46)
MCH: 32.3 pg (ref 26.0–34.0)
MCHC: 36 g/dL (ref 30.0–36.0)
Monocytes Absolute: 1.2 10*3/uL — ABNORMAL HIGH (ref 0.1–1.0)
Neutro Abs: 12 10*3/uL — ABNORMAL HIGH (ref 1.7–7.7)
Neutrophils Relative %: 78 % — ABNORMAL HIGH (ref 43–77)
Platelets: 215 10*3/uL (ref 150–400)
RDW: 13.1 % (ref 11.5–15.5)
WBC: 15.4 10*3/uL — ABNORMAL HIGH (ref 4.0–10.5)

## 2013-02-27 LAB — URINALYSIS, ROUTINE W REFLEX MICROSCOPIC
Leukocytes, UA: NEGATIVE
Nitrite: NEGATIVE
Specific Gravity, Urine: 1.029 (ref 1.005–1.030)
Urobilinogen, UA: 1 mg/dL (ref 0.0–1.0)
pH: 8 (ref 5.0–8.0)

## 2013-02-27 LAB — LIPASE, BLOOD: Lipase: 39 U/L (ref 11–59)

## 2013-02-27 LAB — URINE MICROSCOPIC-ADD ON

## 2013-02-27 MED ORDER — FAMOTIDINE IN NACL 20-0.9 MG/50ML-% IV SOLN
20.0000 mg | Freq: Once | INTRAVENOUS | Status: AC
  Administered 2013-02-27: 20 mg via INTRAVENOUS
  Filled 2013-02-27: qty 50

## 2013-02-27 MED ORDER — OXYCODONE HCL 5 MG PO TABS
5.0000 mg | ORAL_TABLET | Freq: Once | ORAL | Status: AC
  Administered 2013-02-27: 5 mg via ORAL
  Filled 2013-02-27: qty 1

## 2013-02-27 MED ORDER — HYDROMORPHONE HCL PF 1 MG/ML IJ SOLN
1.0000 mg | Freq: Once | INTRAMUSCULAR | Status: AC
  Administered 2013-02-27: 1 mg via INTRAVENOUS
  Filled 2013-02-27: qty 1

## 2013-02-27 MED ORDER — PANTOPRAZOLE SODIUM 40 MG IV SOLR
40.0000 mg | Freq: Once | INTRAVENOUS | Status: AC
  Administered 2013-02-27: 40 mg via INTRAVENOUS
  Filled 2013-02-27: qty 40

## 2013-02-27 MED ORDER — GI COCKTAIL ~~LOC~~
30.0000 mL | Freq: Once | ORAL | Status: AC
  Administered 2013-02-27: 30 mL via ORAL
  Filled 2013-02-27: qty 30

## 2013-02-27 MED ORDER — ONDANSETRON HCL 4 MG/2ML IJ SOLN
4.0000 mg | Freq: Once | INTRAMUSCULAR | Status: AC
  Administered 2013-02-27: 4 mg via INTRAVENOUS
  Filled 2013-02-27: qty 2

## 2013-02-27 NOTE — ED Provider Notes (Signed)
CSN: [id number]     Arrival date & time 02/27/13  2048 History   First MD Initiated Contact with Patient 02/27/13 2051     Chief Complaint  Patient presents with  . Abdominal Pain   (Consider location/radiation/quality/duration/timing/severity/associated sxs/prior Treatment) HPI Comments: Patient presents with history of abdominal pain, vomiting -- presents with c/o upper L and epigastric pain starting this morning. This is same as previous pain. No radiation. No fever, diarrhea, urinary symptoms. States he cannot tolerate home zofran, prilosec 2/2 vomiting. Denies heavy NSAID use, last alcohol use 1 month ago. Patient has had CT x 2 over the past year that were neg, MRCP that was negative, EGD performed at [facility] 07/14 showing esophagitis and gastritis. He was treated with triple therapy. He went to [facility] ED in 09/14 with normal work-up, aggressively negotiating for narcotics per ED notes. The onset of this condition was acute. The course is constant. Aggravating factors: none. Alleviating factors: none.    Patient is a 27 y.o. male presenting with abdominal pain. The history is provided by the patient and medical records.  Abdominal Pain Associated symptoms: chills, nausea and vomiting   Associated symptoms: no chest pain, no cough, no diarrhea, no dysuria, no fever, no hematuria and no sore throat     Past Medical History  Diagnosis Date  . Pancreatitis   . Hepatitis C   . Gastritis    History reviewed. No pertinent past surgical history. Family History  Problem Relation Age of Onset  . CAD Father    History  Substance Use Topics  . Smoking status: Heavy Tobacco Smoker -- 1.00 packs/day  . Smokeless tobacco: Not on file  . Alcohol Use: No    Review of Systems  Constitutional: Positive for chills. Negative for fever.  HENT: Negative for rhinorrhea and sore throat.   Eyes: Negative for redness.  Respiratory: Negative for cough.   Cardiovascular: Negative for chest pain.   Gastrointestinal: Positive for nausea, vomiting and abdominal pain. Negative for diarrhea and blood in stool.  Genitourinary: Negative for dysuria, hematuria and flank pain.  Musculoskeletal: Negative for myalgias.  Skin: Negative for rash.  Neurological: Negative for headaches.    Allergies  Penicillin g; Shellfish allergy; and Sulfa antibiotics  Home Medications  No current outpatient prescriptions on file. BP 158/95  Pulse 105  Temp(Src) 98.2 F (36.8 C) (Oral)  Resp 18  SpO2 97% Physical Exam  Nursing note and vitals reviewed. Constitutional: He appears well-developed and well-nourished.  HENT:  Head: Normocephalic and atraumatic.  Eyes: Conjunctivae are normal. Right eye exhibits no discharge. Left eye exhibits no discharge.  Neck: Normal range of motion. Neck supple.  Cardiovascular: Normal rate, regular rhythm and normal heart sounds.   Pulmonary/Chest: Effort normal and breath sounds normal.  Abdominal: Soft. He exhibits no distension. There is tenderness in the epigastric area and left upper quadrant. There is no rigidity, no rebound, no guarding, no CVA tenderness, no tenderness at McBurney's point and negative Murphy's sign.  Neurological: He is alert.  Skin: Skin is warm and dry.  Psychiatric: He has a normal mood and affect.    ED Course  Procedures (including critical care time) Labs Review Labs Reviewed  CBC WITH DIFFERENTIAL - Abnormal; Notable for the following:    WBC 15.4 (*)    Neutrophils Relative % 78 (*)    Neutro Abs 12.0 (*)    Monocytes Absolute 1.2 (*)    Basophils Absolute 0.2 (*)    All other components  within normal limits  COMPREHENSIVE METABOLIC PANEL - Abnormal; Notable for the following:    Glucose, Bld 101 (*)    Total Protein 8.4 (*)    AST 139 (*)    ALT 193 (*)    All other components within normal limits  URINALYSIS, ROUTINE W REFLEX MICROSCOPIC - Abnormal; Notable for the following:    Protein, ur 30 (*)    All other  components within normal limits  LIPASE, BLOOD  URINE MICROSCOPIC-ADD ON   Imaging Review No results found.  EKG Interpretation   None      9:21 PM Patient seen and examined. Work-up initiated. Medications ordered. [facility] records reviewed.    Vital signs reviewed and are as follows: Filed Vitals:   02/27/13 2054  BP: 158/95  Pulse: 105  Temp: 98.2 F (36.8 C)  Resp: 18   11:09 PM Labs reviewed. Pt seen by Dr. Denton Lank. Patient tolerating PO's. He is requesting dilaudid and percocet for home because that's all he knows will stop his pain.   Will discharge. Encouraged GI follow-up. He has PPI, antiemetics and antacids at home. I suggested he continue to take these.   The patient was urged to return to the Emergency Department immediately with worsening of current symptoms, worsening abdominal pain, persistent vomiting, blood noted in stools, fever, or any other concerns. The patient verbalized understanding.    MDM   1. Abdominal pain   2. Elevated LFTs    Patient with abdominal pain, hep C, concern for drug-seeking behavior. He does have confirmed history of gastritis/esophagitis. CT/MRCP neg otherwise this year. Tolerating PO's in ED. Discussed with patient that narcotic pain medications are not indicated for treatment of chronic abdominal pain. Labs are at his past baselines. He appears well, non-toxic. No clinical dehydration. No emergent or dangerous conditions identified or expected.     Renne Crigler, PA-C 02/27/13 2312

## 2013-02-27 NOTE — ED Notes (Signed)
Pt c/o abd pain and vomiting all day

## 2013-02-27 NOTE — ED Notes (Addendum)
Pt given fluids at this time. Pt requesting more pain medication at this time

## 2013-02-27 NOTE — ED Notes (Signed)
PA at bedside at this time.  

## 2013-02-27 NOTE — ED Notes (Signed)
PA made aware unable to access blood with IV start, will give 1L bolus and attempt again.

## 2013-03-02 NOTE — ED Provider Notes (Signed)
Medical screening examination/treatment/procedure(s) were conducted as a shared visit with non-physician practitioner(s) and myself.  I personally evaluated the patient during the encounter.  EKG Interpretation   None       Pt c/o upper abd pain. States hx same pain in past. Labs. abd soft nt.     Suzi Roots, MD 03/02/13 1055

## 2013-05-10 ENCOUNTER — Emergency Department: Payer: Self-pay | Admitting: Emergency Medicine

## 2013-05-10 LAB — COMPREHENSIVE METABOLIC PANEL
AST: 73 U/L — AB (ref 15–37)
Albumin: 4.1 g/dL (ref 3.4–5.0)
Alkaline Phosphatase: 78 U/L
Anion Gap: 8 (ref 7–16)
BUN: 18 mg/dL (ref 7–18)
Bilirubin,Total: 0.5 mg/dL (ref 0.2–1.0)
CHLORIDE: 107 mmol/L (ref 98–107)
CO2: 23 mmol/L (ref 21–32)
Calcium, Total: 8.9 mg/dL (ref 8.5–10.1)
Creatinine: 1.16 mg/dL (ref 0.60–1.30)
EGFR (Non-African Amer.): >60
Glucose: 95 mg/dL (ref 65–99)
Osmolality: 277 (ref 275–301)
POTASSIUM: 3.8 mmol/L (ref 3.5–5.1)
SGPT (ALT): 119 U/L — ABNORMAL HIGH (ref 12–78)
SODIUM: 138 mmol/L (ref 136–145)
TOTAL PROTEIN: 8.4 g/dL — AB (ref 6.4–8.2)

## 2013-05-10 LAB — ETHANOL
ETHANOL LVL: 171 mg/dL
Ethanol %: 0.171 % — ABNORMAL HIGH (ref 0.000–0.080)

## 2013-05-10 LAB — CBC
HCT: 43.2 % (ref 40.0–52.0)
HGB: 14.3 g/dL (ref 13.0–18.0)
MCH: 31.5 pg (ref 26.0–34.0)
MCHC: 33.2 g/dL (ref 32.0–36.0)
MCV: 95 fL (ref 80–100)
PLATELETS: 192 10*3/uL (ref 150–440)
RBC: 4.55 10*6/uL (ref 4.40–5.90)
RDW: 13.6 % (ref 11.5–14.5)
WBC: 11.1 10*3/uL — AB (ref 3.8–10.6)

## 2013-05-10 LAB — LIPASE, BLOOD: Lipase: 85 U/L (ref 73–393)

## 2013-08-16 IMAGING — CT CT ABD-PELV W/ CM
1 of 2 series · 15 of 32 positions shown, 19 images · non-contrast
Comparison: none

REASON FOR EXAM: (1) Epigastric pain, **no PO contrast please; patient is
actively vomiting.**; (
COMMENTS:   May transport without cardiac monitor

[Series 2: 3mm soft tissue · axial · 0.68mm/px · z∈[-513,-99]mm · 15 of 152 slices shown, 19 images]
[im 7/152  soft-tissue]
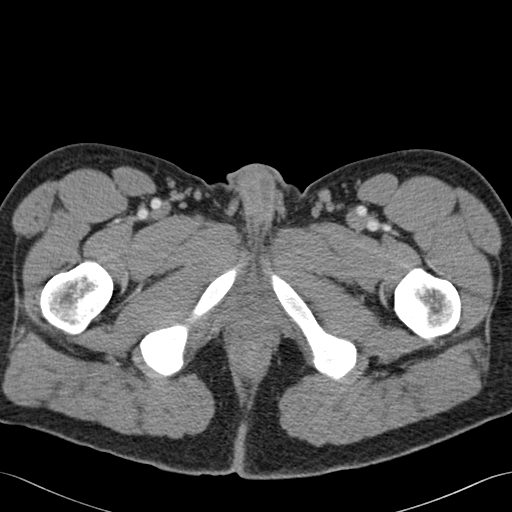
[im 7/152  bone]
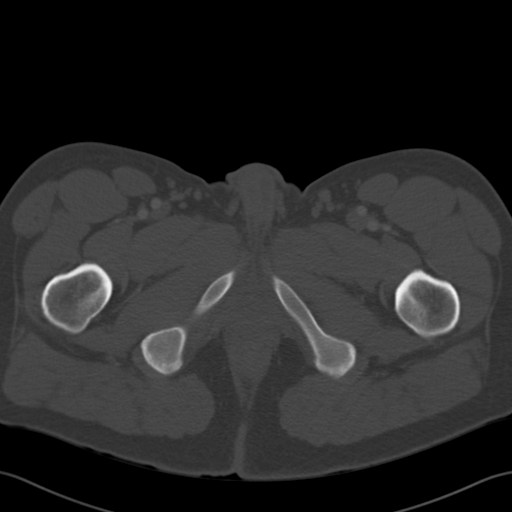
[im 19/152  soft-tissue]
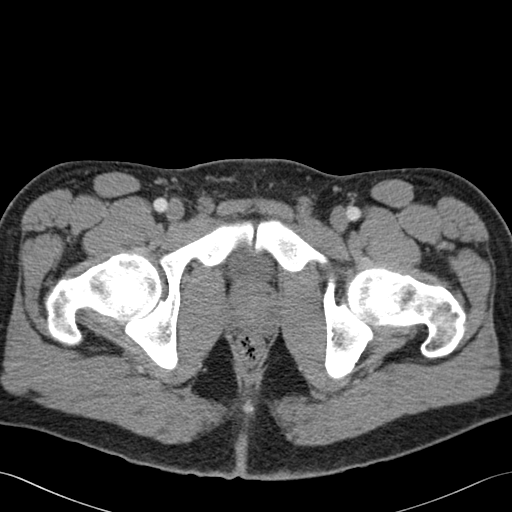
[im 32/152  soft-tissue]
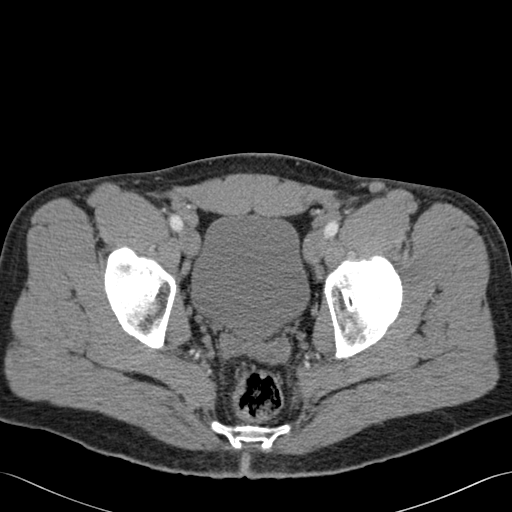
[im 45/152  soft-tissue]
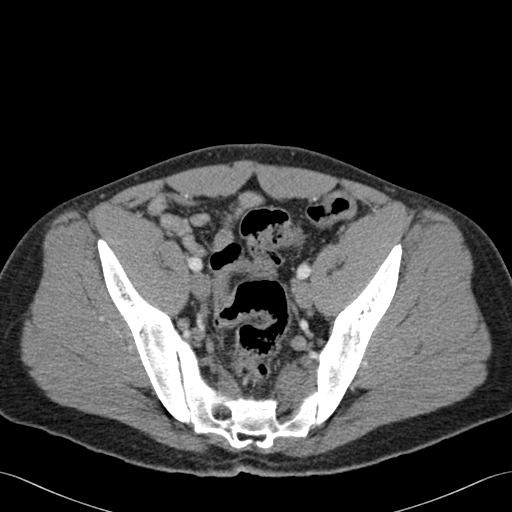
[im 51/152  soft-tissue]
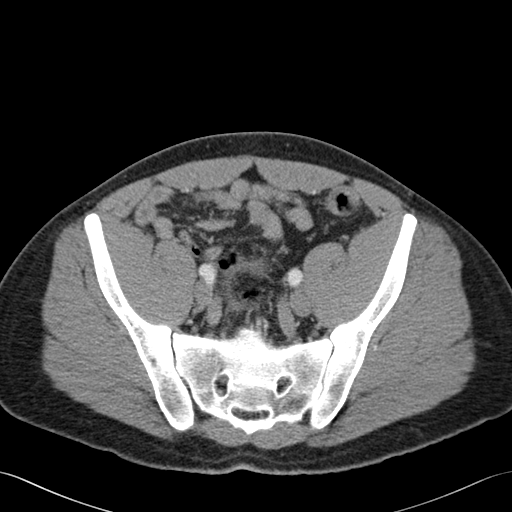
[im 63/152  soft-tissue]
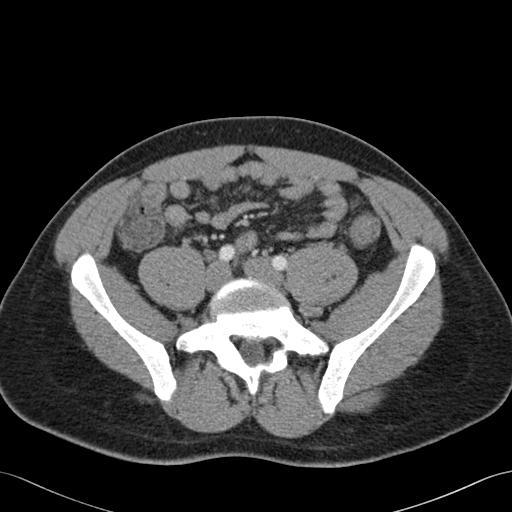
[im 76/152  soft-tissue]
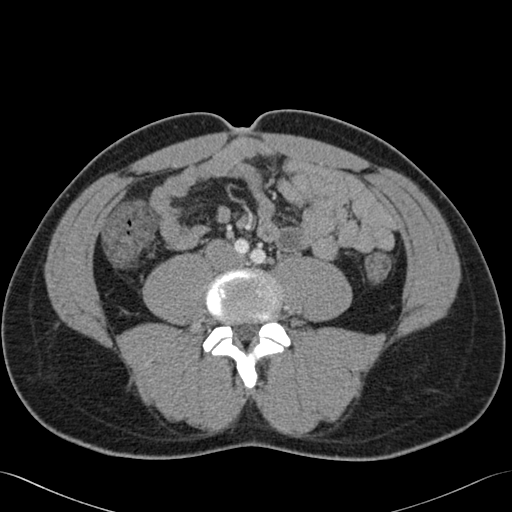
[im 89/152  soft-tissue]
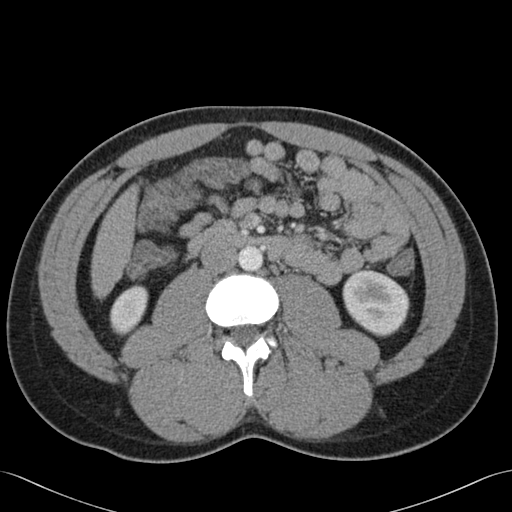
[im 101/152  soft-tissue]
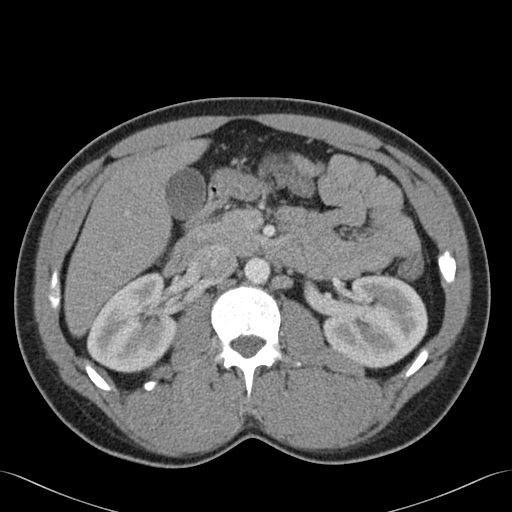
[im 101/152  bone]
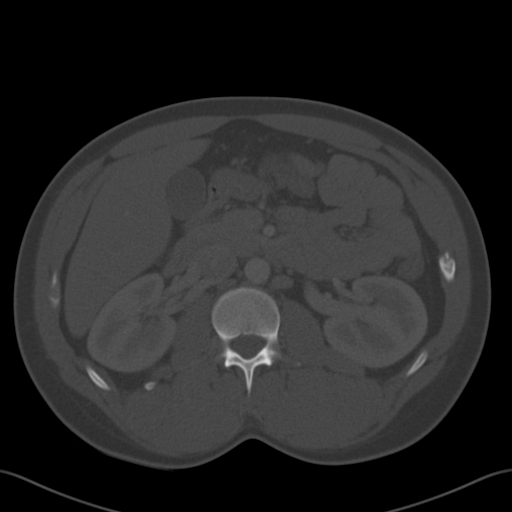
[im 107/152  soft-tissue]
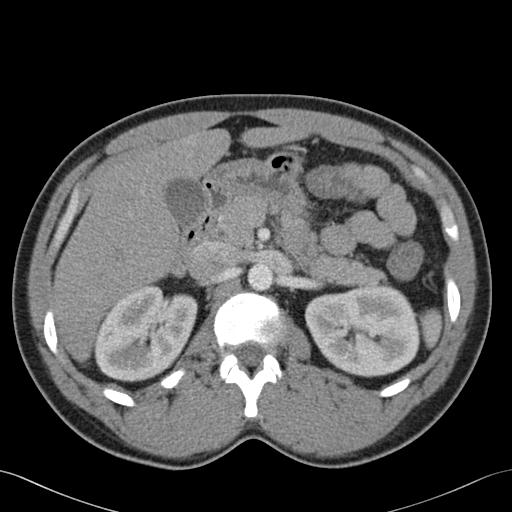
[im 120/152  soft-tissue]
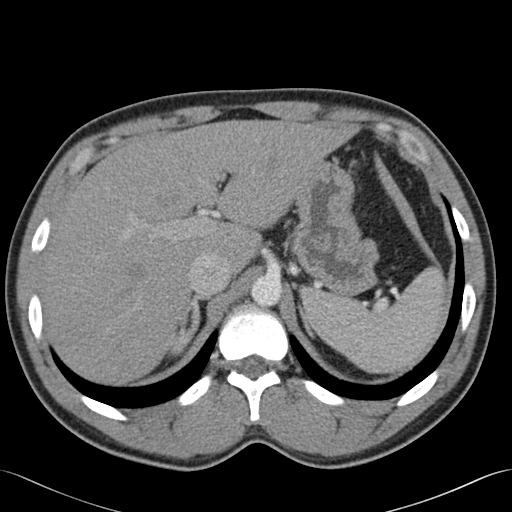
[im 126/152  lung]
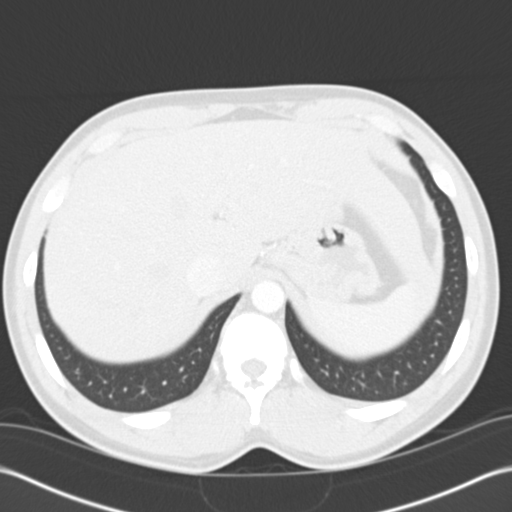
[im 133/152  soft-tissue]
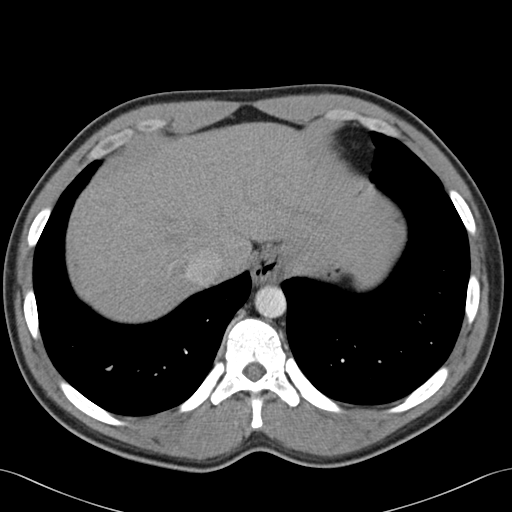
[im 133/152  lung]
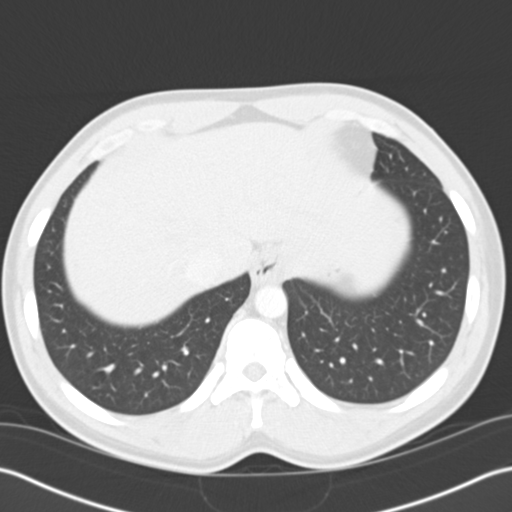
[im 139/152  lung]
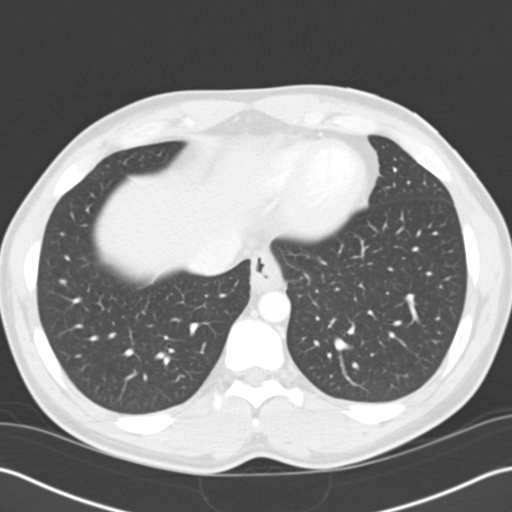
[im 145/152  soft-tissue]
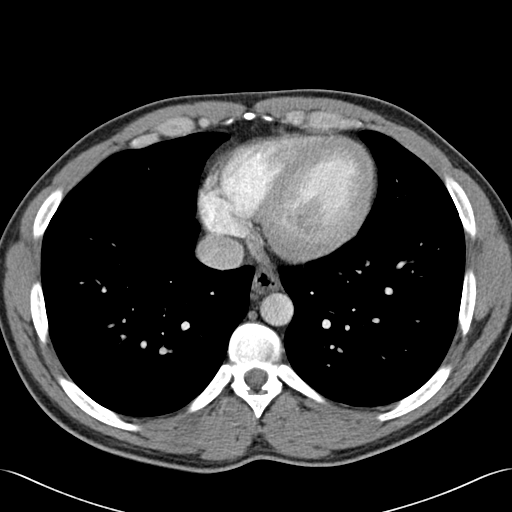
[im 145/152  lung]
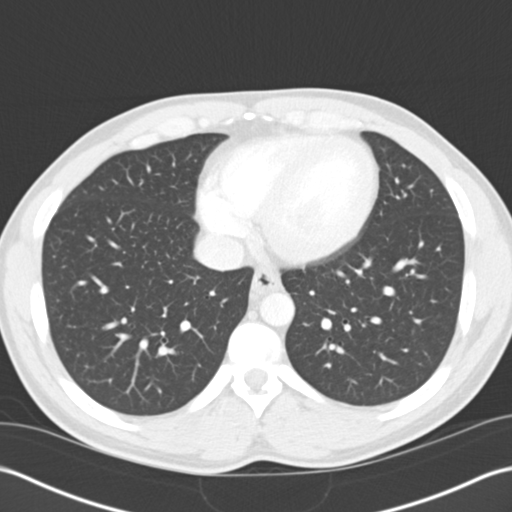

[15 of 32 positions shown; findings below may reference images not displayed]

PROCEDURE:     CT  - CT ABDOMEN / PELVIS  W  - February 25, 2011  [DATE]

RESULT:     CT of the abdomen and pelvis is performed with 100 mL of
Gsovue-23S iodinated intravenous contrast. Oral contrast was not utilized
given the vomiting by the patient. Images are reconstructed at 3.0 mm slice
thickness in the axial plane. Comparison is made to a noncontrast study
dated 24 June, 2010.

The lung bases are clear. The heart appears normal. The liver and spleen
appear to be unremarkable. There is a small hiatal hernia. The adrenal
glands are normal in size without a focal mass. The kidneys show no mass,
obstruction or stone. The abdominal aorta is normal in caliber. The urinary
bladder and prostate appear unremarkable. The appendix appears normal. No
abnormal small bowel distention is present. It is difficult to evaluate
colon given the lack of gas and contrast. Some images suggest some mild
thickening of the wall of the colon. Correlate for possible colitis. There
is no ascites, inflammatory stranding or abscess evident. No gallstones are
present in the gallbladder. The pancreas is unremarkable. There is no
evidence of abdominal wall defect. The bony structures are unremarkable.
IMPRESSION: Cannot exclude the possibility of colitis. Correlate with
history. Small hiatal hernia.

## 2013-10-05 ENCOUNTER — Emergency Department (HOSPITAL_COMMUNITY)
Admission: EM | Admit: 2013-10-05 | Discharge: 2013-10-05 | Disposition: A | Discharge status: Home / Self Care | Payer: Self-pay | Attending: Emergency Medicine | Admitting: Emergency Medicine

## 2013-10-05 ENCOUNTER — Encounter (HOSPITAL_COMMUNITY): Payer: Self-pay | Admitting: Emergency Medicine

## 2013-10-05 DIAGNOSIS — Z8719 Personal history of other diseases of the digestive system: Secondary | ICD-10-CM | POA: Insufficient documentation

## 2013-10-05 DIAGNOSIS — Z880 Allergy status to penicillin: Secondary | ICD-10-CM | POA: Insufficient documentation

## 2013-10-05 DIAGNOSIS — R1084 Generalized abdominal pain: Secondary | ICD-10-CM | POA: Insufficient documentation

## 2013-10-05 DIAGNOSIS — Z8619 Personal history of other infectious and parasitic diseases: Secondary | ICD-10-CM | POA: Insufficient documentation

## 2013-10-05 DIAGNOSIS — R945 Abnormal results of liver function studies: Secondary | ICD-10-CM

## 2013-10-05 DIAGNOSIS — R1013 Epigastric pain: Secondary | ICD-10-CM | POA: Insufficient documentation

## 2013-10-05 DIAGNOSIS — R112 Nausea with vomiting, unspecified: Secondary | ICD-10-CM | POA: Insufficient documentation

## 2013-10-05 DIAGNOSIS — R197 Diarrhea, unspecified: Secondary | ICD-10-CM | POA: Insufficient documentation

## 2013-10-05 DIAGNOSIS — R7989 Other specified abnormal findings of blood chemistry: Secondary | ICD-10-CM | POA: Insufficient documentation

## 2013-10-05 DIAGNOSIS — F17200 Nicotine dependence, unspecified, uncomplicated: Secondary | ICD-10-CM | POA: Insufficient documentation

## 2013-10-05 LAB — CBC WITH DIFFERENTIAL/PLATELET
Basophils Absolute: 0 10*3/uL (ref 0.0–0.1)
Basophils Relative: 0 % (ref 0–1)
Eosinophils Absolute: 0.1 10*3/uL (ref 0.0–0.7)
Eosinophils Relative: 1 % (ref 0–5)
HEMATOCRIT: 43.3 % (ref 39.0–52.0)
HEMOGLOBIN: 15.2 g/dL (ref 13.0–17.0)
LYMPHS PCT: 24 % (ref 12–46)
Lymphs Abs: 2.4 10*3/uL (ref 0.7–4.0)
MCH: 31.2 pg (ref 26.0–34.0)
MCHC: 35.1 g/dL (ref 30.0–36.0)
MCV: 88.9 fL (ref 78.0–100.0)
MONO ABS: 0.9 10*3/uL (ref 0.1–1.0)
MONOS PCT: 9 % (ref 3–12)
Neutro Abs: 6.6 10*3/uL (ref 1.7–7.7)
Neutrophils Relative %: 66 % (ref 43–77)
Platelets: 193 10*3/uL (ref 150–400)
RBC: 4.87 MIL/uL (ref 4.22–5.81)
RDW: 12.9 % (ref 11.5–15.5)
WBC: 9.9 10*3/uL (ref 4.0–10.5)

## 2013-10-05 LAB — COMPREHENSIVE METABOLIC PANEL
ALT: 173 U/L — ABNORMAL HIGH (ref 0–53)
ANION GAP: 20 — AB (ref 5–15)
AST: 103 U/L — ABNORMAL HIGH (ref 0–37)
Albumin: 4.8 g/dL (ref 3.5–5.2)
Alkaline Phosphatase: 78 U/L (ref 39–117)
BILIRUBIN TOTAL: 0.7 mg/dL (ref 0.3–1.2)
BUN: 16 mg/dL (ref 6–23)
CHLORIDE: 98 meq/L (ref 96–112)
CO2: 19 meq/L (ref 19–32)
CREATININE: 0.99 mg/dL (ref 0.50–1.35)
Calcium: 10.1 mg/dL (ref 8.4–10.5)
GFR calc Af Amer: >90 mL/min (ref >90)
GLUCOSE: 84 mg/dL (ref 70–99)
Potassium: 4.8 mEq/L (ref 3.7–5.3)
Sodium: 137 mEq/L (ref 137–147)
Total Protein: 9.2 g/dL — ABNORMAL HIGH (ref 6.0–8.3)

## 2013-10-05 LAB — LIPASE, BLOOD: Lipase: 18 U/L (ref 11–59)

## 2013-10-05 LAB — URINALYSIS, ROUTINE W REFLEX MICROSCOPIC
Glucose, UA: NEGATIVE mg/dL
Hgb urine dipstick: NEGATIVE
KETONES UR: 40 mg/dL — AB
Leukocytes, UA: NEGATIVE
NITRITE: NEGATIVE
PH: 6 (ref 5.0–8.0)
Protein, ur: NEGATIVE mg/dL
Specific Gravity, Urine: 1.034 — ABNORMAL HIGH (ref 1.005–1.030)
Urobilinogen, UA: 1 mg/dL (ref 0.0–1.0)

## 2013-10-05 MED ORDER — SODIUM CHLORIDE 0.9 % IV BOLUS (SEPSIS)
1000.0000 mL | Freq: Once | INTRAVENOUS | Status: AC
  Administered 2013-10-05: 1000 mL via INTRAVENOUS

## 2013-10-05 MED ORDER — ONDANSETRON HCL 4 MG/2ML IJ SOLN
4.0000 mg | Freq: Once | INTRAMUSCULAR | Status: AC
  Administered 2013-10-05: 4 mg via INTRAVENOUS
  Filled 2013-10-05: qty 2

## 2013-10-05 MED ORDER — FAMOTIDINE 20 MG PO TABS: 20.0000 mg | ORAL_TABLET | Freq: Two times a day (BID) | ORAL | Status: DC

## 2013-10-05 MED ORDER — ONDANSETRON 4 MG PO TBDP
4.0000 mg | ORAL_TABLET | Freq: Once | ORAL | Status: AC
  Administered 2013-10-05: 4 mg via ORAL
  Filled 2013-10-05: qty 1

## 2013-10-05 MED ORDER — FAMOTIDINE IN NACL 20-0.9 MG/50ML-% IV SOLN
20.0000 mg | Freq: Once | INTRAVENOUS | Status: AC
  Administered 2013-10-05: 20 mg via INTRAVENOUS
  Filled 2013-10-05: qty 50

## 2013-10-05 MED ORDER — HYDROMORPHONE HCL PF 1 MG/ML IJ SOLN
1.0000 mg | Freq: Once | INTRAMUSCULAR | Status: AC
  Administered 2013-10-05: 1 mg via INTRAVENOUS
  Filled 2013-10-05: qty 1

## 2013-10-05 MED ORDER — ONDANSETRON 4 MG PO TBDP: ORAL_TABLET | ORAL | Status: DC

## 2013-10-05 NOTE — Discharge Instructions (Signed)
Take zofran as directed as needed for nausea. Take pepcid as prescribed.  Abdominal Pain Many things can cause abdominal pain. Usually, abdominal pain is not caused by a disease and will improve without treatment. It can often be observed and treated at home. Your health care provider will do a physical exam and possibly order blood tests and X-rays to help determine the seriousness of your pain. However, in many cases, more time must pass before a clear cause of the pain can be found. Before that point, your health care provider may not know if you need more testing or further treatment. HOME CARE INSTRUCTIONS  Monitor your abdominal pain for any changes. The following actions may help to alleviate any discomfort you are experiencing:  Only take over-the-counter or prescription medicines as directed by your health care provider.  Do not take laxatives unless directed to do so by your health care provider.  Try a clear liquid diet (broth, tea, or water) as directed by your health care provider. Slowly move to a bland diet as tolerated. SEEK MEDICAL CARE IF:  You have unexplained abdominal pain.  You have abdominal pain associated with nausea or diarrhea.  You have pain when you urinate or have a bowel movement.  You experience abdominal pain that wakes you in the night.  You have abdominal pain that is worsened or improved by eating food.  You have abdominal pain that is worsened with eating fatty foods.  You have a fever. SEEK IMMEDIATE MEDICAL CARE IF:   Your pain does not go away within 2 hours.  You keep throwing up (vomiting).  Your pain is felt only in portions of the abdomen, such as the right side or the left lower portion of the abdomen.  You pass bloody or black tarry stools. MAKE SURE YOU:  Understand these instructions.   Will watch your condition.   Will get help right away if you are not doing well or get worse.  Document Released: 12/28/2004 Document  Revised: 03/25/2013 Document Reviewed: 11/27/2012 [facility] Patient Information 2015 [place], [place]. This information is not intended to replace advice given to you by your health care provider. Make sure you discuss any questions you have with your health care provider.

## 2013-10-05 NOTE — ED Notes (Signed)
Pt reports dilaudid is like a "stress reliever to him".  Pt given meds and immediately stopped crying and moaning

## 2013-10-05 NOTE — ED Provider Notes (Signed)
Medical screening examination/treatment/procedure(s) were performed by non-physician practitioner and as supervising physician I was immediately available for consultation/collaboration.   EKG Interpretation None        Houston Siren III, MD 10/05/13 [phone]

## 2013-10-05 NOTE — ED Provider Notes (Signed)
CSN: [id number]     Arrival date & time 10/05/13  1256 History   First MD Initiated Contact with Patient 10/05/13 1330     Chief Complaint  Patient presents with  . Abdominal Pain  . Emesis     (Consider location/radiation/quality/duration/timing/severity/associated sxs/prior Treatment) HPI Comments: 28 year old male clinical history of pancreatitis, hepatitis C and gastritis presents to the emergency department complaining of generalized abdominal pain beginning around 5:00 this morning. Patient states the pain woke him up from sleep and has been constant since, described as sharp, nonradiating rated 10 out of 10. He has not had any alleviating factors for his pain. Admits to associated nausea and 15 episodes of nonbloody emesis along with a few episodes of nonbloody diarrhea. He took the Phenergan that he had "left over from his last visit" with no relief as he vomited it back up. Patient states this feels similar to his past episodes of pancreatitis. States he has not had a flareup for about 6 months. He reports that he had pizza last night, he tries to avoid tomato sauce because it triggers his symptoms, however he did not have anything to eat. Denies any alcohol intake. Denies fever or chills.  Patient is a 28 y.o. male presenting with abdominal pain and vomiting. The history is provided by the patient.  Abdominal Pain Associated symptoms: diarrhea, nausea and vomiting   Emesis Associated symptoms: abdominal pain and diarrhea     Past Medical History  Diagnosis Date  . Pancreatitis   . Hepatitis C   . Gastritis    History reviewed. No pertinent past surgical history. Family History  Problem Relation Age of Onset  . CAD Father    History  Substance Use Topics  . Smoking status: Heavy Tobacco Smoker -- 1.00 packs/day  . Smokeless tobacco: Not on file  . Alcohol Use: No    Review of Systems  Gastrointestinal: Positive for nausea, vomiting, abdominal pain and diarrhea.  All  other systems reviewed and are negative.     Allergies  Penicillin g; Shellfish allergy; and Sulfa antibiotics  Home Medications   Prior to Admission medications   Medication Sig Start Date End Date Taking? Authorizing Provider  promethazine (PHENERGAN) 25 MG tablet Take 25 mg by mouth every 6 (six) hours as needed for nausea or vomiting.   Yes Historical Provider, MD   BP 123/79  Pulse 90  Temp(Src) 98.6 F (37 C) (Oral)  Resp 20  SpO2 100% Physical Exam  Nursing note and vitals reviewed. Constitutional: He is oriented to person, place, and time. He appears well-developed and well-nourished. No distress.  HENT:  Head: Normocephalic and atraumatic.  Mouth/Throat: Oropharynx is clear and moist.  Eyes: Conjunctivae are normal.  Neck: Normal range of motion. Neck supple.  Cardiovascular: Normal rate, regular rhythm and normal heart sounds.   Pulmonary/Chest: Effort normal and breath sounds normal.  Abdominal: Soft. Normal appearance and bowel sounds are normal. There is tenderness. There is no rigidity, no rebound and no guarding.  Generalized abdominal tenderness, worse with epigastric. No peritoneal signs.  Musculoskeletal: Normal range of motion. He exhibits no edema.  Neurological: He is alert and oriented to person, place, and time.  Skin: Skin is warm and dry. He is not diaphoretic.  Psychiatric: He has a normal mood and affect. His behavior is normal.    ED Course  Procedures (including critical care time) Labs Review Labs Reviewed  COMPREHENSIVE METABOLIC PANEL - Abnormal; Notable for the following:    Total  Protein 9.2 (*)    AST 103 (*)    ALT 173 (*)    Anion gap 20 (*)    All other components within normal limits  URINALYSIS, ROUTINE W REFLEX MICROSCOPIC - Abnormal; Notable for the following:    Color, Urine AMBER (*)    Specific Gravity, Urine 1.034 (*)    Bilirubin Urine SMALL (*)    Ketones, ur 40 (*)    All other components within normal limits  CBC  WITH DIFFERENTIAL  LIPASE, BLOOD    Imaging Review No results found.   EKG Interpretation None      MDM   Final diagnoses:  Epigastric pain  Elevated LFTs   Patient presenting with abdominal pain, nausea, vomiting and diarrhea, history of pancreatitis, hepatitis and gastritis. He is nontoxic appearing and in no apparent distress. Afebrile, vital signs stable. Abdomen was generalized tenderness, worse in midepigastric without peritoneal signs. Labs pending. Pt receiving IV lfuids. Pain/nausea control. 2:43 PM 15 minutes after patient received 1 mg IV Dilaudid, he requested more narcotic medication. On chart review, it is noted he has a history of drug-seeking behavior and presents to multiple hospitals for the same. Will give pepcid and zofran. LFTs elevated, however this is consistent with his prior labs. 3:45 PM Pt tolerating PO and reports he still feels nauseated but not as bad as before on repeat abdominal exam, abdomen still tender, however improvement from initial exam. Stable for d/c. Will d/c with pepcid and ODT zofran. Resources for primary care followup and gastroenterology followup given. Return precautions given. Patient states understanding of treatment care plan and is agreeable.  Illene Labrador, PA-C 10/05/13 1547

## 2013-10-05 NOTE — ED Notes (Signed)
Pt reported he vomited again, given new vomit bag. Reports his pain is 10/10 again.

## 2013-10-05 NOTE — ED Notes (Signed)
Pt escorted to discharge window. Pt verbalized understanding discharge instructions. In no acute distress.  

## 2013-10-05 NOTE — ED Notes (Signed)
Pt c/o abd pain that started this monring, states he ate a pizza last night and sometimes the tomatoes sauce makes it hurt. Pt has hx of pancreatitis. Pt states he tried to take phenergan pill for nausea but that came back up.

## 2013-10-11 ENCOUNTER — Emergency Department (HOSPITAL_COMMUNITY)
Admission: EM | Admit: 2013-10-11 | Discharge: 2013-10-11 | Disposition: A | Discharge status: Home / Self Care | Payer: Self-pay | Attending: Emergency Medicine | Admitting: Emergency Medicine

## 2013-10-11 ENCOUNTER — Emergency Department (HOSPITAL_COMMUNITY): Payer: Self-pay

## 2013-10-11 ENCOUNTER — Encounter (HOSPITAL_COMMUNITY): Payer: Self-pay | Admitting: Emergency Medicine

## 2013-10-11 DIAGNOSIS — F17200 Nicotine dependence, unspecified, uncomplicated: Secondary | ICD-10-CM | POA: Insufficient documentation

## 2013-10-11 DIAGNOSIS — R1013 Epigastric pain: Secondary | ICD-10-CM | POA: Insufficient documentation

## 2013-10-11 DIAGNOSIS — Z79899 Other long term (current) drug therapy: Secondary | ICD-10-CM | POA: Insufficient documentation

## 2013-10-11 DIAGNOSIS — Z880 Allergy status to penicillin: Secondary | ICD-10-CM | POA: Insufficient documentation

## 2013-10-11 DIAGNOSIS — Z8719 Personal history of other diseases of the digestive system: Secondary | ICD-10-CM | POA: Insufficient documentation

## 2013-10-11 DIAGNOSIS — Z8619 Personal history of other infectious and parasitic diseases: Secondary | ICD-10-CM | POA: Insufficient documentation

## 2013-10-11 DIAGNOSIS — R112 Nausea with vomiting, unspecified: Secondary | ICD-10-CM | POA: Insufficient documentation

## 2013-10-11 LAB — CBC WITH DIFFERENTIAL/PLATELET
BASOS PCT: 1 % (ref 0–1)
Basophils Absolute: 0.1 10*3/uL (ref 0.0–0.1)
EOS ABS: 0.3 10*3/uL (ref 0.0–0.7)
EOS PCT: 4 % (ref 0–5)
HCT: 41.4 % (ref 39.0–52.0)
HEMOGLOBIN: 14.2 g/dL (ref 13.0–17.0)
Lymphocytes Relative: 35 % (ref 12–46)
Lymphs Abs: 2.8 10*3/uL (ref 0.7–4.0)
MCH: 30.9 pg (ref 26.0–34.0)
MCHC: 34.3 g/dL (ref 30.0–36.0)
MCV: 90.2 fL (ref 78.0–100.0)
MONOS PCT: 8 % (ref 3–12)
Monocytes Absolute: 0.7 10*3/uL (ref 0.1–1.0)
NEUTROS PCT: 52 % (ref 43–77)
Neutro Abs: 4.2 10*3/uL (ref 1.7–7.7)
Platelets: 177 10*3/uL (ref 150–400)
RBC: 4.59 MIL/uL (ref 4.22–5.81)
RDW: 12.7 % (ref 11.5–15.5)
WBC: 8.1 10*3/uL (ref 4.0–10.5)

## 2013-10-11 LAB — URINALYSIS, ROUTINE W REFLEX MICROSCOPIC
Bilirubin Urine: NEGATIVE
Glucose, UA: NEGATIVE mg/dL
Hgb urine dipstick: NEGATIVE
Ketones, ur: NEGATIVE mg/dL
Leukocytes, UA: NEGATIVE
NITRITE: NEGATIVE
PROTEIN: NEGATIVE mg/dL
SPECIFIC GRAVITY, URINE: 1.024 (ref 1.005–1.030)
UROBILINOGEN UA: 1 mg/dL (ref 0.0–1.0)
pH: 7 (ref 5.0–8.0)

## 2013-10-11 LAB — COMPREHENSIVE METABOLIC PANEL
ALBUMIN: 4.2 g/dL (ref 3.5–5.2)
ALT: 102 U/L — ABNORMAL HIGH (ref 0–53)
AST: 52 U/L — ABNORMAL HIGH (ref 0–37)
Alkaline Phosphatase: 65 U/L (ref 39–117)
Anion gap: 12 (ref 5–15)
BUN: 13 mg/dL (ref 6–23)
CALCIUM: 9.8 mg/dL (ref 8.4–10.5)
CO2: 25 mEq/L (ref 19–32)
Chloride: 104 mEq/L (ref 96–112)
Creatinine, Ser: 0.98 mg/dL (ref 0.50–1.35)
GFR calc Af Amer: >90 mL/min (ref >90)
GFR calc non Af Amer: >90 mL/min (ref >90)
Glucose, Bld: 97 mg/dL (ref 70–99)
POTASSIUM: 4.8 meq/L (ref 3.7–5.3)
Sodium: 141 mEq/L (ref 137–147)
TOTAL PROTEIN: 8.3 g/dL (ref 6.0–8.3)
Total Bilirubin: 0.2 mg/dL — ABNORMAL LOW (ref 0.3–1.2)

## 2013-10-11 LAB — LIPASE, BLOOD: LIPASE: 33 U/L (ref 11–59)

## 2013-10-11 MED ORDER — ONDANSETRON HCL 4 MG/2ML IJ SOLN
4.0000 mg | Freq: Once | INTRAMUSCULAR | Status: AC
  Administered 2013-10-11: 4 mg via INTRAVENOUS
  Filled 2013-10-11: qty 2

## 2013-10-11 MED ORDER — TRAMADOL HCL 50 MG PO TABS: 50.0000 mg | ORAL_TABLET | Freq: Four times a day (QID) | ORAL | Status: DC | PRN

## 2013-10-11 MED ORDER — HYDROMORPHONE HCL PF 1 MG/ML IJ SOLN
1.0000 mg | Freq: Once | INTRAMUSCULAR | Status: AC
  Administered 2013-10-11: 1 mg via INTRAVENOUS
  Filled 2013-10-11: qty 1

## 2013-10-11 MED ORDER — LORAZEPAM 2 MG/ML IJ SOLN
1.0000 mg | Freq: Once | INTRAMUSCULAR | Status: AC
  Administered 2013-10-11: 1 mg via INTRAVENOUS
  Filled 2013-10-11: qty 1

## 2013-10-11 MED ORDER — PANTOPRAZOLE SODIUM 40 MG IV SOLR
40.0000 mg | Freq: Once | INTRAVENOUS | Status: AC
  Administered 2013-10-11: 40 mg via INTRAVENOUS
  Filled 2013-10-11: qty 40

## 2013-10-11 MED ORDER — SODIUM CHLORIDE 0.9 % IV BOLUS (SEPSIS)
1000.0000 mL | Freq: Once | INTRAVENOUS | Status: AC
  Administered 2013-10-11: 1000 mL via INTRAVENOUS

## 2013-10-11 MED ORDER — GI COCKTAIL ~~LOC~~
30.0000 mL | Freq: Once | ORAL | Status: AC
  Administered 2013-10-11: 30 mL via ORAL
  Filled 2013-10-11: qty 30

## 2013-10-11 NOTE — ED Notes (Signed)
Pt attempted to esign but signature pad not working

## 2013-10-11 NOTE — ED Notes (Signed)
Pt given warm blankets and provided with urinal for urine sample. Pt is A&O and in NAD

## 2013-10-11 NOTE — ED Notes (Signed)
Pt w/ hx of gastritis & pancreatitis c/o gen abd pain w/ NV since 0600 this morning.

## 2013-10-11 NOTE — ED Notes (Signed)
Pt from home reports N/V that started this am. Pt attempted to take Zofran ODT at home w/o success. Pt denies diarrhea. Pt reports 10/10 lower, abd pain. Pt last BM was yesterday. Pt has hx of pancreatitis and denies ETOH. Pt is A&O and in NAD

## 2013-10-11 NOTE — ED Notes (Signed)
Sr Steinl aware of pt pain and advised that after he reviews pt [place], pt will probably go home and to advise pt of this. Pt advised

## 2013-10-11 NOTE — ED Provider Notes (Signed)
CSN: [id number]     Arrival date & time 10/11/13  0930 History   First MD Initiated Contact with Patient 10/11/13 [phone]     Chief Complaint  Patient presents with  . Nausea  . Emesis  . Abdominal Pain     (Consider location/radiation/quality/duration/timing/severity/associated sxs/prior Treatment) HPI Comments: Patient presents to the emergency department with chief complaint of epigastric abdominal pain. He states pain started this morning around 6:00. He reports associated nausea and vomiting. He has a history of pancreatitis and gastritis. He was seen approximately a week ago for similar symptoms. States that he has not used any alcohol in over a year. States his pain is 10 out of 10. Nothing makes it better or worse. States that he did have followup with gastroenterology, but he can no longer afford it. No prior abdominal surgical history.  The history is provided by the patient. No language interpreter was used.    Past Medical History  Diagnosis Date  . Pancreatitis   . Hepatitis C   . Gastritis    No past surgical history on file. Family History  Problem Relation Age of Onset  . CAD Father    History  Substance Use Topics  . Smoking status: Heavy Tobacco Smoker -- 1.00 packs/day  . Smokeless tobacco: Not on file  . Alcohol Use: No    Review of Systems  All other systems reviewed and are negative.     Allergies  Penicillin g; Shellfish allergy; and Sulfa antibiotics  Home Medications   Prior to Admission medications   Medication Sig Start Date End Date Taking? Authorizing Provider  famotidine (PEPCID) 20 MG tablet Take 1 tablet (20 mg total) by mouth 2 (two) times daily. 10/05/13   Illene Labrador, PA-C  ondansetron (ZOFRAN ODT) 4 MG disintegrating tablet 4mg  ODT q4 hours prn nausea/vomit 10/05/13   Illene Labrador, PA-C  promethazine (PHENERGAN) 25 MG tablet Take 25 mg by mouth every 6 (six) hours as needed for nausea or vomiting.    Historical Provider, MD   BP  144/90  Pulse 87  Temp(Src) 98 F (36.7 C) (Oral)  Resp 18  SpO2 100% Physical Exam  Nursing note and vitals reviewed. Constitutional: He is oriented to person, place, and time. He appears well-developed and well-nourished.  HENT:  Head: Normocephalic and atraumatic.  Eyes: Conjunctivae and EOM are normal. Pupils are equal, round, and reactive to light. Right eye exhibits no discharge. Left eye exhibits no discharge. No scleral icterus.  Neck: Normal range of motion. Neck supple. No JVD present.  Cardiovascular: Normal rate, regular rhythm and normal heart sounds.  Exam reveals no gallop and no friction rub.   No murmur heard. Pulmonary/Chest: Effort normal and breath sounds normal. No respiratory distress. He has no wheezes. He has no rales. He exhibits no tenderness.  Abdominal: Soft. He exhibits no distension and no mass. There is tenderness. There is no rebound and no guarding.  Tenderness to palpation in the epigastric region, otherwise abdomen is unremarkable  Musculoskeletal: Normal range of motion. He exhibits no edema and no tenderness.  Neurological: He is alert and oriented to person, place, and time.  Skin: Skin is warm and dry.  Psychiatric: He has a normal mood and affect. His behavior is normal. Judgment and thought content normal.    ED Course  Procedures (including critical care time) Results for orders placed during the hospital encounter of 10/11/13  CBC WITH DIFFERENTIAL      Result Value Ref  Range   WBC 8.1  4.0 - 10.5 K/uL   RBC 4.59  4.22 - 5.81 MIL/uL   Hemoglobin 14.2  13.0 - 17.0 g/dL   HCT 41.4  39.0 - 52.0 %   MCV 90.2  78.0 - 100.0 fL   MCH 30.9  26.0 - 34.0 pg   MCHC 34.3  30.0 - 36.0 g/dL   RDW 12.7  11.5 - 15.5 %   Platelets 177  150 - 400 K/uL   Neutrophils Relative % 52  43 - 77 %   Neutro Abs 4.2  1.7 - 7.7 K/uL   Lymphocytes Relative 35  12 - 46 %   Lymphs Abs 2.8  0.7 - 4.0 K/uL   Monocytes Relative 8  3 - 12 %   Monocytes Absolute 0.7   0.1 - 1.0 K/uL   Eosinophils Relative 4  0 - 5 %   Eosinophils Absolute 0.3  0.0 - 0.7 K/uL   Basophils Relative 1  0 - 1 %   Basophils Absolute 0.1  0.0 - 0.1 K/uL  COMPREHENSIVE METABOLIC PANEL      Result Value Ref Range   Sodium 141  137 - 147 mEq/L   Potassium 4.8  3.7 - 5.3 mEq/L   Chloride 104  96 - 112 mEq/L   CO2 25  19 - 32 mEq/L   Glucose, Bld 97  70 - 99 mg/dL   BUN 13  6 - 23 mg/dL   Creatinine, Ser 0.98  0.50 - 1.35 mg/dL   Calcium 9.8  8.4 - 10.5 mg/dL   Total Protein 8.3  6.0 - 8.3 g/dL   Albumin 4.2  3.5 - 5.2 g/dL   AST 52 (*) 0 - 37 U/L   ALT 102 (*) 0 - 53 U/L   Alkaline Phosphatase 65  39 - 117 U/L   Total Bilirubin 0.2 (*) 0.3 - 1.2 mg/dL   GFR calc non Af Amer >90  >90 mL/min   GFR calc Af Amer >90  >90 mL/min   Anion gap 12  5 - 15  LIPASE, BLOOD      Result Value Ref Range   Lipase 33  11 - 59 U/L  URINALYSIS, ROUTINE W REFLEX MICROSCOPIC      Result Value Ref Range   Color, Urine YELLOW  YELLOW   APPearance CLEAR  CLEAR   Specific Gravity, Urine 1.024  1.005 - 1.030   pH 7.0  5.0 - 8.0   Glucose, UA NEGATIVE  NEGATIVE mg/dL   Hgb urine dipstick NEGATIVE  NEGATIVE   Bilirubin Urine NEGATIVE  NEGATIVE   Ketones, ur NEGATIVE  NEGATIVE mg/dL   Protein, ur NEGATIVE  NEGATIVE mg/dL   Urobilinogen, UA 1.0  0.0 - 1.0 mg/dL   Nitrite NEGATIVE  NEGATIVE   Leukocytes, UA NEGATIVE  NEGATIVE   US Abdomen Complete  10/11/2013   CLINICAL DATA:  Abdominal pain, right upper quadrant pain  EXAM: ULTRASOUND ABDOMEN COMPLETE  COMPARISON:  CT 07/08/2012, MRI 06/11/2012 the  FINDINGS: Gallbladder:  No gallstones or wall thickening visualized. No sonographic Murphy sign noted.  Common bile duct:  Diameter: Normal at 3 mm.  Liver:  The two round hyperechoic lesions measuring less than 2 cm each which corresponds to hemangiomas on comparison MRI and CT. Within the lateral left hepatic lobe there is a hypodense nodular lesion measuring 16 mm which is not clearly seen on  comparison studies. There is no biliary duct dilatation. The liver does have  a nodular contour.  IVC:  No abnormality visualized.  Pancreas:  Visualized portion unremarkable.  Spleen:  Size and appearance within normal limits.  Right Kidney:  Length: 11.0 cm. Echogenicity within normal limits. No mass or hydronephrosis visualized.  Left Kidney:  Length: 11.8 cm. Echogenicity within normal limits. No mass or hydronephrosis visualized.  Abdominal aorta:  No aneurysm visualized.  Other findings:  No ascites  IMPRESSION: 1. No acute findings in the abdomen by ultrasound. 2. Normal gallbladder. 3. Indeterminate hyperchoic lesion is present in the left hepatic lobe. In patient with cirrhosis, recommend nonemergent MRI with without contrast for further characterization. 4. Nodular liver consistent with cirrhosis. 5. Two hepatic hemangiomas are re demonstrated.   Electronically Signed   By: Suzy Bouchard M.D.   On: 10/11/2013 12:50     No results found.   EKG Interpretation None      MDM   Final diagnoses:  Epigastric pain    Patient with epigastric abdominal pain. History of gastritis and pancreatitis. Labs are unremarkable. Abdomen is improved. No acute findings on ultrasound. Patient states his pain is alleviated with Dilaudid here. Will discharge to home with some tramadol per patient's request, also recommend PCP followup. Patient will need to have nonemergent MRI. I discussed this with the patient, he understands and agrees to plan. Patient is stable and ready for discharge. Patient seen by and discussed with Dr. Ashok Cordia.   Montine Circle, PA-C 10/11/13 1306

## 2013-10-11 NOTE — ED Notes (Signed)
Will d/c pt after time period has elapsed to reassess pain level.

## 2013-10-11 NOTE — ED Provider Notes (Signed)
Medical screening examination/treatment/procedure(s) were conducted as a shared visit with non-physician practitioner(s) and myself.  I personally evaluated the patient during the encounter.  Pt with hx chronic pancreatitis, chronic recurrent epigastric pain c/o epigastric pain. Pt previous care mainly at outside institutions, now presenting to [facility]. No fevers. abd soft nt. No emesis. Labs. Rx.   Mirna Mires, MD 10/11/13 [phone]

## 2013-10-11 NOTE — Discharge Instructions (Signed)
You need to follow-up with [facility].  You need to have a non-emergent MRI of your abdomen as soon as possible.  Abdominal Pain Many things can cause abdominal pain. Usually, abdominal pain is not caused by a disease and will improve without treatment. It can often be observed and treated at home. Your health care provider will do a physical exam and possibly order blood tests and X-rays to help determine the seriousness of your pain. However, in many cases, more time must pass before a clear cause of the pain can be found. Before that point, your health care provider may not know if you need more testing or further treatment. HOME CARE INSTRUCTIONS  Monitor your abdominal pain for any changes. The following actions may help to alleviate any discomfort you are experiencing:  Only take over-the-counter or prescription medicines as directed by your health care provider.  Do not take laxatives unless directed to do so by your health care provider.  Try a clear liquid diet (broth, tea, or water) as directed by your health care provider. Slowly move to a bland diet as tolerated. SEEK MEDICAL CARE IF:  You have unexplained abdominal pain.  You have abdominal pain associated with nausea or diarrhea.  You have pain when you urinate or have a bowel movement.  You experience abdominal pain that wakes you in the night.  You have abdominal pain that is worsened or improved by eating food.  You have abdominal pain that is worsened with eating fatty foods.  You have a fever. SEEK IMMEDIATE MEDICAL CARE IF:   Your pain does not go away within 2 hours.  You keep throwing up (vomiting).  Your pain is felt only in portions of the abdomen, such as the right side or the left lower portion of the abdomen.  You pass bloody or black tarry stools. MAKE SURE YOU:  Understand these instructions.   Will watch your condition.   Will get help right away if you are not doing  well or get worse.  Document Released: 12/28/2004 Document Revised: 03/25/2013 Document Reviewed: 11/27/2012 [facility] Patient Information 2015 [place], [place]. This information is not intended to replace advice given to you by your health care provider. Make sure you discuss any questions you have with your health care provider.

## 2013-10-15 ENCOUNTER — Emergency Department: Payer: Self-pay | Admitting: Emergency Medicine

## 2014-01-26 ENCOUNTER — Emergency Department (HOSPITAL_COMMUNITY)
Admission: EM | Admit: 2014-01-26 | Discharge: 2014-01-26 | Disposition: A | Discharge status: Home / Self Care | Payer: Self-pay | Attending: Emergency Medicine | Admitting: Emergency Medicine

## 2014-01-26 ENCOUNTER — Encounter (HOSPITAL_COMMUNITY): Payer: Self-pay | Admitting: Emergency Medicine

## 2014-01-26 ENCOUNTER — Emergency Department: Payer: Self-pay | Admitting: Emergency Medicine

## 2014-01-26 DIAGNOSIS — Z79899 Other long term (current) drug therapy: Secondary | ICD-10-CM | POA: Insufficient documentation

## 2014-01-26 DIAGNOSIS — F419 Anxiety disorder, unspecified: Secondary | ICD-10-CM | POA: Insufficient documentation

## 2014-01-26 DIAGNOSIS — Z8619 Personal history of other infectious and parasitic diseases: Secondary | ICD-10-CM | POA: Insufficient documentation

## 2014-01-26 DIAGNOSIS — R1013 Epigastric pain: Secondary | ICD-10-CM | POA: Insufficient documentation

## 2014-01-26 DIAGNOSIS — Z720 Tobacco use: Secondary | ICD-10-CM | POA: Insufficient documentation

## 2014-01-26 DIAGNOSIS — Z8719 Personal history of other diseases of the digestive system: Secondary | ICD-10-CM | POA: Insufficient documentation

## 2014-01-26 DIAGNOSIS — R109 Unspecified abdominal pain: Secondary | ICD-10-CM | POA: Insufficient documentation

## 2014-01-26 DIAGNOSIS — Z765 Malingerer [conscious simulation]: Secondary | ICD-10-CM

## 2014-01-26 DIAGNOSIS — Z880 Allergy status to penicillin: Secondary | ICD-10-CM | POA: Insufficient documentation

## 2014-01-26 DIAGNOSIS — Z7289 Other problems related to lifestyle: Secondary | ICD-10-CM | POA: Insufficient documentation

## 2014-01-26 DIAGNOSIS — R112 Nausea with vomiting, unspecified: Secondary | ICD-10-CM | POA: Insufficient documentation

## 2014-01-26 DIAGNOSIS — R1084 Generalized abdominal pain: Secondary | ICD-10-CM | POA: Insufficient documentation

## 2014-01-26 LAB — RAPID URINE DRUG SCREEN, HOSP PERFORMED
Amphetamines: NOT DETECTED
Barbiturates: NOT DETECTED
Benzodiazepines: NOT DETECTED
Cocaine: NOT DETECTED
Opiates: NOT DETECTED
Tetrahydrocannabinol: POSITIVE — AB

## 2014-01-26 LAB — COMPREHENSIVE METABOLIC PANEL
ALBUMIN: 4 g/dL (ref 3.4–5.0)
ALT: 65 U/L — AB (ref 0–53)
AST: 48 U/L — AB (ref 0–37)
Albumin: 4.3 g/dL (ref 3.5–5.2)
Alkaline Phosphatase: 66 U/L
Alkaline Phosphatase: 67 U/L (ref 39–117)
Anion Gap: 8 (ref 7–16)
Anion gap: 12 (ref 5–15)
BUN: 18 mg/dL (ref 7–18)
BUN: 17 mg/dL (ref 6–23)
Bilirubin,Total: 0.5 mg/dL (ref 0.2–1.0)
CALCIUM: 9 mg/dL (ref 8.5–10.1)
CHLORIDE: 103 meq/L (ref 96–112)
CO2: 25 mmol/L (ref 21–32)
CO2: 25 mEq/L (ref 19–32)
CREATININE: 1.2 mg/dL (ref 0.60–1.30)
Calcium: 9.5 mg/dL (ref 8.4–10.5)
Chloride: 109 mmol/L — ABNORMAL HIGH (ref 98–107)
Creatinine, Ser: 1.14 mg/dL (ref 0.50–1.35)
EGFR (African American): >60
GFR calc Af Amer: >90 mL/min (ref >90)
GFR, EST NON AFRICAN AMERICAN: 85 mL/min — AB (ref >90)
GLUCOSE: 109 mg/dL — AB (ref 65–99)
Glucose, Bld: 113 mg/dL — ABNORMAL HIGH (ref 70–99)
Osmolality: 286 (ref 275–301)
POTASSIUM: 3.4 mmol/L — AB (ref 3.5–5.1)
Potassium: 3.4 mEq/L — ABNORMAL LOW (ref 3.7–5.3)
SGOT(AST): 50 U/L — ABNORMAL HIGH (ref 15–37)
SGPT (ALT): 83 U/L — ABNORMAL HIGH
Sodium: 142 mmol/L (ref 136–145)
Sodium: 140 mEq/L (ref 137–147)
TOTAL PROTEIN: 7.7 g/dL (ref 6.4–8.2)
Total Bilirubin: 0.5 mg/dL (ref 0.3–1.2)
Total Protein: 8.2 g/dL (ref 6.0–8.3)

## 2014-01-26 LAB — URINALYSIS, ROUTINE W REFLEX MICROSCOPIC
BILIRUBIN URINE: NEGATIVE
Bilirubin Urine: NEGATIVE
GLUCOSE, UA: NEGATIVE mg/dL
Glucose, UA: NEGATIVE mg/dL
Hgb urine dipstick: NEGATIVE
Hgb urine dipstick: NEGATIVE
KETONES UR: NEGATIVE mg/dL
Ketones, ur: 15 mg/dL — AB
LEUKOCYTES UA: NEGATIVE
Leukocytes, UA: NEGATIVE
NITRITE: NEGATIVE
Nitrite: NEGATIVE
PROTEIN: NEGATIVE mg/dL
Protein, ur: NEGATIVE mg/dL
Specific Gravity, Urine: 1.027 (ref 1.005–1.030)
Specific Gravity, Urine: 1.027 (ref 1.005–1.030)
Urobilinogen, UA: 0.2 mg/dL (ref 0.0–1.0)
Urobilinogen, UA: 1 mg/dL (ref 0.0–1.0)
pH: 8 (ref 5.0–8.0)
pH: 7 (ref 5.0–8.0)

## 2014-01-26 LAB — CBC
HCT: 39.7 % — ABNORMAL LOW (ref 40.0–52.0)
HGB: 12.8 g/dL — ABNORMAL LOW (ref 13.0–18.0)
MCH: 30.3 pg (ref 26.0–34.0)
MCHC: 32.2 g/dL (ref 32.0–36.0)
MCV: 94 fL (ref 80–100)
PLATELETS: 165 10*3/uL (ref 150–440)
RBC: 4.22 10*6/uL — ABNORMAL LOW (ref 4.40–5.90)
RDW: 13.3 % (ref 11.5–14.5)
WBC: 11.9 10*3/uL — ABNORMAL HIGH (ref 3.8–10.6)

## 2014-01-26 LAB — CBC WITH DIFFERENTIAL/PLATELET
BASOS ABS: 0 10*3/uL (ref 0.0–0.1)
Basophils Relative: 0 % (ref 0–1)
Eosinophils Absolute: 0 10*3/uL (ref 0.0–0.7)
Eosinophils Relative: 0 % (ref 0–5)
HEMATOCRIT: 39.7 % (ref 39.0–52.0)
Hemoglobin: 13.5 g/dL (ref 13.0–17.0)
LYMPHS PCT: 17 % (ref 12–46)
Lymphs Abs: 1.9 10*3/uL (ref 0.7–4.0)
MCH: 30.6 pg (ref 26.0–34.0)
MCHC: 34 g/dL (ref 30.0–36.0)
MCV: 90 fL (ref 78.0–100.0)
Monocytes Absolute: 0.7 10*3/uL (ref 0.1–1.0)
Monocytes Relative: 7 % (ref 3–12)
NEUTROS ABS: 8.4 10*3/uL — AB (ref 1.7–7.7)
Neutrophils Relative %: 76 % (ref 43–77)
Platelets: 192 10*3/uL (ref 150–400)
RBC: 4.41 MIL/uL (ref 4.22–5.81)
RDW: 12.6 % (ref 11.5–15.5)
WBC: 11.1 10*3/uL — AB (ref 4.0–10.5)

## 2014-01-26 LAB — ETHANOL

## 2014-01-26 LAB — LIPASE, BLOOD
LIPASE: 102 U/L (ref 73–393)
Lipase: 18 U/L (ref 11–59)

## 2014-01-26 MED ORDER — PROMETHAZINE HCL 25 MG RE SUPP
25.0000 mg | Freq: Four times a day (QID) | RECTAL | Status: DC | PRN
  Administered 2014-01-26: 25 mg via RECTAL
  Filled 2014-01-26: qty 1

## 2014-01-26 MED ORDER — GI COCKTAIL ~~LOC~~
30.0000 mL | Freq: Once | ORAL | Status: AC
  Administered 2014-01-26: 30 mL via ORAL
  Filled 2014-01-26: qty 30

## 2014-01-26 MED ORDER — LORAZEPAM 2 MG/ML IJ SOLN
1.0000 mg | Freq: Once | INTRAMUSCULAR | Status: AC
  Administered 2014-01-26: 1 mg via INTRAVENOUS
  Filled 2014-01-26: qty 1

## 2014-01-26 MED ORDER — FAMOTIDINE 20 MG PO TABS
40.0000 mg | ORAL_TABLET | Freq: Once | ORAL | Status: AC
  Administered 2014-01-26: 40 mg via ORAL
  Filled 2014-01-26: qty 2

## 2014-01-26 MED ORDER — SODIUM CHLORIDE 0.9 % IV SOLN: INTRAVENOUS | Status: DC

## 2014-01-26 MED ORDER — SODIUM CHLORIDE 0.9 % IV BOLUS (SEPSIS)
1000.0000 mL | Freq: Once | INTRAVENOUS | Status: AC
  Administered 2014-01-26: 1000 mL via INTRAVENOUS

## 2014-01-26 MED ORDER — PROMETHAZINE HCL 25 MG/ML IJ SOLN
25.0000 mg | Freq: Once | INTRAMUSCULAR | Status: AC
  Administered 2014-01-26: 25 mg via INTRAVENOUS
  Filled 2014-01-26: qty 1

## 2014-01-26 NOTE — ED Notes (Signed)
Pt ambulating independently w/ steady gait on d/c in no acute distress, A&Ox4.  

## 2014-01-26 NOTE — ED Provider Notes (Signed)
Medical screening examination/treatment/procedure(s) were performed by non-physician practitioner and as supervising physician I was immediately available for consultation/collaboration.    Delora Fuel, MD 67/20/94 7096

## 2014-01-26 NOTE — ED Notes (Signed)
Per EMS pt from home, pt was at [facility] Friday, this morning went to [facility] this morning for pain medication and they wouldn't give him any so called 911 and brought here, pt states he's out of his oxycodone and tramadol, states he needs medications, pt has hx of pancreatitis. Pt states is nauseous.

## 2014-01-26 NOTE — ED Provider Notes (Signed)
CSN: [id number]     Arrival date & time 01/26/14  1017 History   First MD Initiated Contact with Patient 01/26/14 1022     Chief Complaint  Patient presents with  . Abdominal Pain     (Consider location/radiation/quality/duration/timing/severity/associated sxs/prior Treatment) HPI Comments: Patient here complaining of worsening abdominal pain secondary to pancreatitis. Patient's pain is chronic and localized to his epigastric area. Was recently admitted to [facility] for same. Was seen at [facility] today for same requesting pain medications but left because he did not receive them. Thus nausea and nonbilious vomiting. Pain is persistent and nothing makes it better or worse. No treatment use prior to arrival.  Patient is a 28 y.o. male presenting with abdominal pain. The history is provided by the patient.  Abdominal Pain   Past Medical History  Diagnosis Date  . Pancreatitis   . Hepatitis C   . Gastritis    History reviewed. No pertinent past surgical history. Family History  Problem Relation Age of Onset  . CAD Father    History  Substance Use Topics  . Smoking status: Heavy Tobacco Smoker -- 1.00 packs/day  . Smokeless tobacco: Not on file  . Alcohol Use: No    Review of Systems  Gastrointestinal: Positive for abdominal pain.  All other systems reviewed and are negative.     Allergies  Penicillin g; Shellfish allergy; and Sulfa antibiotics  Home Medications   Prior to Admission medications   Medication Sig Start Date End Date Taking? Authorizing Provider  famotidine (PEPCID) 20 MG tablet Take 1 tablet (20 mg total) by mouth 2 (two) times daily. 10/05/13   Robyn M Hess, PA-C  ondansetron (ZOFRAN-ODT) 4 MG disintegrating tablet Take 4 mg by mouth every 8 (eight) hours as needed for nausea or vomiting.    Historical Provider, MD  traMADol (ULTRAM) 50 MG tablet Take 1 tablet (50 mg total) by mouth every 6 (six) hours as needed. 10/11/13   Montine Circle, PA-C    BP 148/93  Pulse 71  Temp(Src) 98.4 F (36.9 C) (Oral)  Resp 18  SpO2 99% Physical Exam  Nursing note and vitals reviewed. Constitutional: He is oriented to person, place, and time. He appears well-developed and well-nourished.  Non-toxic appearance. No distress.  HENT:  Head: Normocephalic and atraumatic.  Eyes: Conjunctivae, EOM and lids are normal. Pupils are equal, round, and reactive to light.  Neck: Normal range of motion. Neck supple. No tracheal deviation present. No mass present.  Cardiovascular: Normal rate, regular rhythm and normal heart sounds.  Exam reveals no gallop.   No murmur heard. Pulmonary/Chest: Effort normal and breath sounds normal. No stridor. No respiratory distress. He has no decreased breath sounds. He has no wheezes. He has no rhonchi. He has no rales.  Abdominal: Soft. Normal appearance and bowel sounds are normal. He exhibits no distension. There is no tenderness. There is no rebound and no CVA tenderness.  Musculoskeletal: Normal range of motion. He exhibits no edema and no tenderness.  Neurological: He is alert and oriented to person, place, and time. He has normal strength. No cranial nerve deficit or sensory deficit. GCS eye subscore is 4. GCS verbal subscore is 5. GCS motor subscore is 6.  Skin: Skin is warm and dry. No abrasion and no rash noted.  Psychiatric: His speech is normal. His mood appears anxious. He is agitated.    ED Course  Procedures (including critical care time) Labs Review Labs Reviewed  URINE CULTURE  CBC  WITH DIFFERENTIAL  COMPREHENSIVE METABOLIC PANEL  LIPASE, BLOOD  URINALYSIS, ROUTINE W REFLEX MICROSCOPIC  ETHANOL  URINE RAPID DRUG SCREEN (HOSP PERFORMED)    Imaging Review No results found.   EKG Interpretation None      MDM   Final diagnoses:  None    Patient given IV fluids here and I spoke with the EDP at [facility] who evaluated the patient has morning and he relayed me the patient's laboratory  studies was included a CBC, lipase, cmet all of which did not show any acute abnormality. I reviewed the patient's old chart from [facility] from an inpatient admission 2 days ago which showed a normal abdominal CT as well as normal labs. Patient has no evidence of pancreatitis. There was strong evidence of drug-seeking behavior which he has exhibited here as well too and this was confirmed by the EDP at [facility] who is very familiar with the patient. Patient here has been wandering the department and repeatedly asking for pain meds which have informed him I would not be giving him. He has no surgical abdomen at this time. He is stable for discharge    Leota Jacobsen, MD 01/26/14 1234

## 2014-01-26 NOTE — ED Notes (Signed)
PA AT BEDSIDE  

## 2014-01-26 NOTE — Discharge Instructions (Signed)

## 2014-01-26 NOTE — Discharge Instructions (Signed)
Call for a follow up appointment with a Family or Primary Care Provider.  Return if Symptoms worsen.   Take medication as prescribed.  Call a specialist for further evaluation of your abdominal discomfort.

## 2014-01-26 NOTE — ED Notes (Signed)
Pt refused triage and refused vital signs.  Dr. Zenia Resides at bedside talking to patient.

## 2014-01-26 NOTE — ED Provider Notes (Signed)
Patient represented again requesting pain medication. I informed him once more that he would not be receiving any. His exam hasn't changed. And he is discharged  Leota Jacobsen, MD 01/26/14 1316

## 2014-01-26 NOTE — ED Notes (Signed)
Patient has arrived on pod c.  

## 2014-01-26 NOTE — ED Notes (Signed)
Patient to ed tonight complaining of upper abdominal pain. States he has not been drinking. States he has a history of pancreatitis and gastritis. States he had a flare of it Friday night and went to [facility] in [place]. States he was given meds and fluids then discharged. States he went home and had to go back on Sunday night again for the same. States he was given dilaudid in the emergency room. States he was discharged home with percocet, zantac and prilosec. States pain is back tonight and feels nauseated. Pt is writhing in bed. Asking for the doctor to hurry and bring him some pain medicine. He is warm and dry. Mucous membranes are pink and moist.

## 2014-01-26 NOTE — ED Notes (Signed)
Pt wondering hallway after being told multiple times to use call bell, pt asking other RN's for pain medication. EDP made aware.

## 2014-01-26 NOTE — ED Notes (Addendum)
Pt found to be wondering around the department asking staff for pain medication.  Pt directed back in room by security.  Zenia Resides MD, primary RN, this Charge RN, and security had a conversation w/ patient regarding his plan of care and informed that he would be escorted out, if he does not stay in his room and stop asking pain medication.  Pt verbalized understanding.

## 2014-01-26 NOTE — ED Notes (Signed)
Pt ambulating in hallway w/o difficulty asking for warm blanket, when told to go back to room pt standing in doorway, pt instructed to go back in room.

## 2014-01-26 NOTE — ED Provider Notes (Signed)
CSN: [id number]     Arrival date & time 01/26/14  1511 History   First MD Initiated Contact with Patient 01/26/14 1821     Chief Complaint  Patient presents with  . Abdominal Pain     (Consider location/radiation/quality/duration/timing/severity/associated sxs/prior Treatment) HPI Comments: The patient is a 28 year old male history of pancreatitis presenting to the emergency Department chief complaint of abdominal discomfort ongoing for 4 days.He reports associated vomiting. He reports he was recently evaluated by [facility], discharged with Percocet, tramadol, Zofran and omeprazole. Reports discharged with pain, then stated he has resolution of symptoms until the next morning. Initially patient reported that he had not filled the prescription for oxycodone, given by [facility], later reports vomiting up 2 Percocets today.  The patient reported that was the last Hospital encounter for his symptoms, was Sunday at [facility]. Pt changed story upon asking if he was seen at [facility] today. Reports stopped drinking EtOH, confirms tobacco abuse.  The history is provided by the patient. No language interpreter was used.    Past Medical History  Diagnosis Date  . Pancreatitis   . Hepatitis C   . Gastritis    History reviewed. No pertinent past surgical history. Family History  Problem Relation Age of Onset  . CAD Father    History  Substance Use Topics  . Smoking status: Heavy Tobacco Smoker -- 1.00 packs/day  . Smokeless tobacco: Not on file  . Alcohol Use: No    Review of Systems  Constitutional: Negative for fever and chills.  Gastrointestinal: Positive for nausea, vomiting and abdominal pain. Negative for diarrhea and constipation.  Genitourinary: Negative for dysuria and hematuria.      Allergies  Penicillin g; Shellfish allergy; and Sulfa antibiotics  Home Medications   Prior to Admission medications   Medication Sig Start Date End Date Taking? Authorizing Provider  famotidine (PEPCID)  20 MG tablet Take 20 mg by mouth 2 (two) times daily.   Yes Historical Provider, MD  ondansetron (ZOFRAN) 4 MG tablet Take 4 mg by mouth every 6 (six) hours as needed. 01/23/14 01/30/14 Yes Historical Provider, MD  oxyCODONE-acetaminophen (PERCOCET/ROXICET) 5-325 MG per tablet Take 1 tablet by mouth every 4 (four) hours as needed. 01/23/14  Yes Historical Provider, MD  promethazine (PHENERGAN) 12.5 MG tablet Take 25 mg by mouth every 8 (eight) hours as needed for nausea or vomiting.  01/25/14 02/01/14 Yes Historical Provider, MD  traMADol (ULTRAM) 50 MG tablet Take 50 mg by mouth every 6 (six) hours as needed for moderate pain.    Yes Historical Provider, MD   BP 144/103  Pulse 76  Temp(Src) 98.2 F (36.8 C)  Resp 18  Wt 214 lb 1 oz (97.098 kg)  SpO2 100% Physical Exam  Nursing note and vitals reviewed. Constitutional: He is oriented to person, place, and time. He appears well-developed and well-nourished.  Non-toxic appearance. He does not have a sickly appearance. He does not appear ill. No distress.  HENT:  Head: Normocephalic and atraumatic.  Eyes: No scleral icterus.  Neck: Normal range of motion. Neck supple.  Cardiovascular: Normal rate and regular rhythm.   Pulmonary/Chest: Effort normal and breath sounds normal. No respiratory distress. He has no wheezes. He has no rales.  Abdominal: Soft. Normal appearance. He exhibits no distension. There is tenderness in the epigastric area. There is no rebound and no guarding.  Distractible tenderness with voluntary guarding. Mild epigastric discomfort.  Musculoskeletal: Normal range of motion.  Neurological: He is alert and oriented to  person, place, and time. He is not disoriented. GCS eye subscore is 4. GCS verbal subscore is 5. GCS motor subscore is 6.  Skin: Skin is warm and dry. He is not diaphoretic.  Psychiatric: His mood appears anxious.    ED Course  Procedures (including critical care time) Labs Review Labs Reviewed   URINALYSIS, ROUTINE W REFLEX MICROSCOPIC - Abnormal; Notable for the following:    Color, Urine AMBER (*)    Ketones, ur 15 (*)    All other components within normal limits    Imaging Review No results found.   EKG Interpretation None      MDM   Final diagnoses:  Epigastric pain  Drug-seeking behavior   Patient presents with abdominal discomfort, recently seen at [facility] [place] [facility]. EDP that time reviewed [facility] records which showed normal CT and labs without concern for pancreatitis. Patient failed to mention [facility] and  evaluation earlier in the day, when questioned when the last hospital evaluation of of his symptoms occurred, he stated Sunday.  [facility] and has repeatedly asked for Dilaudid in ED. Patient is requesting Dilaudid 2 mg in ED with Phenergan. Patient's story has changed multiple times about oxycodone prescription, initially stating was not filled then stated he vomited the medication up today. Discussed that I would not give him Dilaudid. He reports his vomiting, no evidence of dehydration on exam. UA with 15 ketones, no other any change from earlier today. Discussed I will give him a rectal suppository for Phenergan for his nausea but no narcotic pain medication. Pt followed provider out to hall to ask for pain medication.  After discharge the patient continued to ask RNs for narcotic pain medication, discussed with RN that I would not be prescribing any narcotics for the patient. After the patient was escorted to the front lobby, front desk continue to call for narcotic pain medication. Meds given in ED:  Medications  promethazine (PHENERGAN) suppository 25 mg (not administered)    New Prescriptions   No medications on file     Harvie Heck, PA-C 01/26/14 2252

## 2014-01-26 NOTE — ED Notes (Signed)
UPon entering pt's room - pt noted to be lying comfortably on stretcher, eye closed, no acute distress - resp even and unlabored - after verbal stimuli did pt open eyes, pt initially declined PR phenergan and reports improvement of nausea. Once this RN began to review discharge instructions did pt again c/o nausea.

## 2014-01-26 NOTE — ED Notes (Signed)
Pt again requesting pain medication as well - pt reminded of prescriptions he was given a recent visit and to use as advised on prescription - pt adamant this RN speak to the Dr/PA about additional pain medication - discussed potential risks of narcotic overuse, pt verbalized understanding and continues to request pain medication.

## 2014-01-26 NOTE — ED Notes (Signed)
Bed: WA24 Expected date:  Expected time:  Means of arrival:  Comments: EMS-abdominal pain 

## 2014-01-26 NOTE — ED Notes (Signed)
Per pt sts he is here for abdominal pain and pancreatitis flare up. St some nausea.

## 2014-01-26 NOTE — ED Notes (Signed)
Pt yelling in room, stating "I need pain medication, I'm in severe pain, get the doctor now". Pt states having a pancreatitis flare up that started Friday, was seen at hospital given IV medications and said he was fine after and left, states he went to [facility] this morning and didn't get along with the doctor b/c he wouldn't give him any pain medications, pt states he needs IV pain medications b/c he can't keep anything down. Pt pacing in hallway and room, stating "I need pain medication, I'm really hurting".

## 2014-01-26 NOTE — ED Notes (Signed)
Pt refused to leave.  Escorted off property.  Pt came back and was waiting in the lobby.  When asked why he was here, pt states that he was waiting for doctor allen to change his mind.  Pt was reminded that he has been seen twice and that he was not getting pain medication.  Pt was again escorted off property.

## 2014-01-27 LAB — URINE CULTURE
Colony Count: NO GROWTH
Culture: NO GROWTH

## 2014-02-01 ENCOUNTER — Encounter (HOSPITAL_COMMUNITY): Payer: Self-pay | Admitting: Emergency Medicine

## 2014-02-01 ENCOUNTER — Emergency Department (HOSPITAL_COMMUNITY)
Admission: EM | Admit: 2014-02-01 | Discharge: 2014-02-01 | Disposition: A | Discharge status: Home / Self Care | Payer: Self-pay | Attending: Emergency Medicine | Admitting: Emergency Medicine

## 2014-02-01 DIAGNOSIS — R112 Nausea with vomiting, unspecified: Secondary | ICD-10-CM | POA: Insufficient documentation

## 2014-02-01 DIAGNOSIS — Z79899 Other long term (current) drug therapy: Secondary | ICD-10-CM | POA: Insufficient documentation

## 2014-02-01 DIAGNOSIS — Z8719 Personal history of other diseases of the digestive system: Secondary | ICD-10-CM | POA: Insufficient documentation

## 2014-02-01 DIAGNOSIS — F1210 Cannabis abuse, uncomplicated: Secondary | ICD-10-CM | POA: Insufficient documentation

## 2014-02-01 DIAGNOSIS — R1013 Epigastric pain: Secondary | ICD-10-CM | POA: Insufficient documentation

## 2014-02-01 DIAGNOSIS — Z720 Tobacco use: Secondary | ICD-10-CM | POA: Insufficient documentation

## 2014-02-01 DIAGNOSIS — Z8619 Personal history of other infectious and parasitic diseases: Secondary | ICD-10-CM | POA: Insufficient documentation

## 2014-02-01 DIAGNOSIS — R197 Diarrhea, unspecified: Secondary | ICD-10-CM | POA: Insufficient documentation

## 2014-02-01 DIAGNOSIS — Z880 Allergy status to penicillin: Secondary | ICD-10-CM | POA: Insufficient documentation

## 2014-02-01 LAB — COMPREHENSIVE METABOLIC PANEL
ALT: 40 U/L (ref 0–53)
ANION GAP: 13 (ref 5–15)
AST: 22 U/L (ref 0–37)
Albumin: 3.8 g/dL (ref 3.5–5.2)
Alkaline Phosphatase: 57 U/L (ref 39–117)
BUN: 10 mg/dL (ref 6–23)
CALCIUM: 9.1 mg/dL (ref 8.4–10.5)
CO2: 25 meq/L (ref 19–32)
CREATININE: 0.93 mg/dL (ref 0.50–1.35)
Chloride: 102 mEq/L (ref 96–112)
GFR calc Af Amer: >90 mL/min (ref >90)
GLUCOSE: 106 mg/dL — AB (ref 70–99)
Potassium: 3.6 mEq/L — ABNORMAL LOW (ref 3.7–5.3)
Sodium: 140 mEq/L (ref 137–147)
Total Bilirubin: <0.2 mg/dL — ABNORMAL LOW (ref 0.3–1.2)
Total Protein: 7.2 g/dL (ref 6.0–8.3)

## 2014-02-01 LAB — RAPID URINE DRUG SCREEN, HOSP PERFORMED
Amphetamines: NOT DETECTED
BARBITURATES: NOT DETECTED
Benzodiazepines: NOT DETECTED
COCAINE: NOT DETECTED
OPIATES: NOT DETECTED
Tetrahydrocannabinol: POSITIVE — AB

## 2014-02-01 LAB — CBC WITH DIFFERENTIAL/PLATELET
BASOS PCT: 1 % (ref 0–1)
Basophils Absolute: 0.1 10*3/uL (ref 0.0–0.1)
EOS ABS: 0.4 10*3/uL (ref 0.0–0.7)
EOS PCT: 3 % (ref 0–5)
HCT: 39.7 % (ref 39.0–52.0)
Hemoglobin: 13.2 g/dL (ref 13.0–17.0)
Lymphocytes Relative: 27 % (ref 12–46)
Lymphs Abs: 3.4 10*3/uL (ref 0.7–4.0)
MCH: 30.6 pg (ref 26.0–34.0)
MCHC: 33.2 g/dL (ref 30.0–36.0)
MCV: 92.1 fL (ref 78.0–100.0)
MONOS PCT: 9 % (ref 3–12)
Monocytes Absolute: 1.1 10*3/uL — ABNORMAL HIGH (ref 0.1–1.0)
NEUTROS ABS: 7.5 10*3/uL (ref 1.7–7.7)
NEUTROS PCT: 60 % (ref 43–77)
PLATELETS: 215 10*3/uL (ref 150–400)
RBC: 4.31 MIL/uL (ref 4.22–5.81)
RDW: 13.2 % (ref 11.5–15.5)
WBC: 12.5 10*3/uL — AB (ref 4.0–10.5)

## 2014-02-01 LAB — LIPASE, BLOOD: Lipase: 32 U/L (ref 11–59)

## 2014-02-01 MED ORDER — SUCRALFATE 1 G PO TABS
1.0000 g | ORAL_TABLET | Freq: Once | ORAL | Status: AC
  Administered 2014-02-01: 1 g via ORAL
  Filled 2014-02-01: qty 1

## 2014-02-01 MED ORDER — PROMETHAZINE HCL 25 MG/ML IJ SOLN
25.0000 mg | Freq: Once | INTRAMUSCULAR | Status: AC
  Administered 2014-02-01: 25 mg via INTRAVENOUS
  Filled 2014-02-01: qty 1

## 2014-02-01 MED ORDER — LORAZEPAM 2 MG/ML IJ SOLN
1.0000 mg | Freq: Once | INTRAMUSCULAR | Status: AC
  Administered 2014-02-01: 1 mg via INTRAVENOUS
  Filled 2014-02-01: qty 1

## 2014-02-01 MED ORDER — SODIUM CHLORIDE 0.9 % IV BOLUS (SEPSIS)
1000.0000 mL | Freq: Once | INTRAVENOUS | Status: AC
  Administered 2014-02-01: 1000 mL via INTRAVENOUS

## 2014-02-01 MED ORDER — GI COCKTAIL ~~LOC~~
30.0000 mL | Freq: Once | ORAL | Status: AC
  Administered 2014-02-01: 30 mL via ORAL
  Filled 2014-02-01: qty 30

## 2014-02-01 MED ORDER — METOCLOPRAMIDE HCL 5 MG/ML IJ SOLN
10.0000 mg | Freq: Once | INTRAMUSCULAR | Status: AC
  Administered 2014-02-01: 10 mg via INTRAVENOUS
  Filled 2014-02-01: qty 2

## 2014-02-01 MED ORDER — KETOROLAC TROMETHAMINE 30 MG/ML IJ SOLN
30.0000 mg | Freq: Once | INTRAMUSCULAR | Status: AC
  Administered 2014-02-01: 30 mg via INTRAVENOUS
  Filled 2014-02-01: qty 1

## 2014-02-01 NOTE — ED Provider Notes (Addendum)
CSN: [id number]     Arrival date & time 02/01/14  0803 History   First MD Initiated Contact with Patient 02/01/14 [phone]     Chief Complaint  Patient presents with  . Nausea  . Emesis  . Diarrhea  . Abdominal Pain     (Consider location/radiation/quality/duration/timing/severity/associated sxs/prior Treatment) HPI Comments: Patient here complaining of worsening chronic abdominal pain. Seen by myself for similar symptoms a week ago. According to the old records was seen at [facility] 4 days ago for similar symptoms. Labs review at that time did not show any evidence of pancreatitis. Continues to complain of midepigastric pain radiating to his back. Nonbilious emesis noted without fever. No  black or bloody stools.patient extubated. Anxious due to his pain has not been able to be controlled  Patient is a 28 y.o. male presenting with vomiting, diarrhea, and abdominal pain. The history is provided by the patient.  Emesis Associated symptoms: abdominal pain and diarrhea   Diarrhea Associated symptoms: abdominal pain and vomiting   Abdominal Pain Associated symptoms: diarrhea and vomiting     Past Medical History  Diagnosis Date  . Pancreatitis   . Hepatitis C   . Gastritis    No past surgical history on file. Family History  Problem Relation Age of Onset  . CAD Father    History  Substance Use Topics  . Smoking status: Heavy Tobacco Smoker -- 1.00 packs/day  . Smokeless tobacco: Not on file  . Alcohol Use: No    Review of Systems  Gastrointestinal: Positive for vomiting, abdominal pain and diarrhea.  All other systems reviewed and are negative.     Allergies  Penicillin g; Shellfish allergy; and Sulfa antibiotics  Home Medications   Prior to Admission medications   Medication Sig Start Date End Date Taking? Authorizing Provider  famotidine (PEPCID) 20 MG tablet Take 20 mg by mouth 2 (two) times daily.    Historical Provider, MD  oxyCODONE-acetaminophen  (PERCOCET/ROXICET) 5-325 MG per tablet Take 1 tablet by mouth every 4 (four) hours as needed. 01/23/14   Historical Provider, MD  promethazine (PHENERGAN) 12.5 MG tablet Take 25 mg by mouth every 8 (eight) hours as needed for nausea or vomiting.  01/25/14 02/01/14  Historical Provider, MD  traMADol (ULTRAM) 50 MG tablet Take 50 mg by mouth every 6 (six) hours as needed for moderate pain.     Historical Provider, MD   BP 157/100 mmHg  Pulse 73  Temp(Src) 98 F (36.7 C) (Oral)  Resp 16  SpO2 100% Physical Exam  Constitutional: He is oriented to person, place, and time. He appears well-developed and well-nourished.  Non-toxic appearance. No distress.  HENT:  Head: Normocephalic and atraumatic.  Eyes: Conjunctivae, EOM and lids are normal. Pupils are equal, round, and reactive to light.  Neck: Normal range of motion. Neck supple. No tracheal deviation present. No thyroid mass present.  Cardiovascular: Normal rate, regular rhythm and normal heart sounds.  Exam reveals no gallop.   No murmur heard. Pulmonary/Chest: Effort normal and breath sounds normal. No stridor. No respiratory distress. He has no decreased breath sounds. He has no wheezes. He has no rhonchi. He has no rales.  Abdominal: Soft. Normal appearance and bowel sounds are normal. He exhibits no distension. There is tenderness in the epigastric area. There is no rigidity, no rebound, no guarding and no CVA tenderness.    Musculoskeletal: Normal range of motion. He exhibits no edema or tenderness.  Neurological: He is alert and oriented to  person, place, and time. He has normal strength. No cranial nerve deficit or sensory deficit. GCS eye subscore is 4. GCS verbal subscore is 5. GCS motor subscore is 6.  Skin: Skin is warm and dry. No abrasion and no rash noted.  Psychiatric: He has a normal mood and affect. His speech is normal and behavior is normal.  Nursing note and vitals reviewed.   ED Course  Procedures (including critical  care time) Labs Review Labs Reviewed  CBC WITH DIFFERENTIAL  LIPASE, BLOOD  COMPREHENSIVE METABOLIC PANEL    Imaging Review No results found.   EKG Interpretation None      MDM   Final diagnoses:  None    Recent given IV fluids, antiemetics, Ativan. He is asking me for IV narcotics which I have told him multiple times and I would not be giving him. His old record from [facility] was reviewed from his visit on 10/29. I believe the patient's symptoms could be from his cannabis use. He is strongly encouraged to follow-up with a gastroenterologist.Repeat exam at time of discharge does not demonstrate any evidence of a surgical abdomen at this time    Leota Jacobsen, MD 02/01/14 1025  Leota Jacobsen, MD 02/01/14 1026

## 2014-02-01 NOTE — ED Notes (Signed)
Pt reports nausea is better and would like GI cocktail and Carafate. Both given patient tolerating well.

## 2014-02-01 NOTE — ED Notes (Signed)
Pt presents today w/ gen abd pain NVD x 2 hrs.

## 2014-02-01 NOTE — ED Notes (Signed)
MD Zenia Resides made aware that patient is c/o left lower back pain and nausea.

## 2014-02-01 NOTE — ED Notes (Signed)
Pt ambulating to restroom with steady gait

## 2014-02-01 NOTE — ED Notes (Signed)
Pt aware of the need for a urine sample. 

## 2014-02-01 NOTE — Discharge Instructions (Signed)

## 2014-02-01 NOTE — ED Notes (Signed)
Pt ambulatory upon discharge. He reports that his girlfriend is driving him home. She reports he is leaving with ALL belongings her arrived with.  He was advised to follow up with his PCP for further pain management.

## 2014-03-30 ENCOUNTER — Emergency Department: Payer: Self-pay | Admitting: Emergency Medicine

## 2014-03-30 LAB — CBC WITH DIFFERENTIAL/PLATELET
Basophil #: 0.1 10*3/uL (ref 0.0–0.1)
Basophil %: 0.5 %
EOS ABS: 0.1 10*3/uL (ref 0.0–0.7)
EOS PCT: 0.8 %
HCT: 40.8 % (ref 40.0–52.0)
HGB: 13.5 g/dL (ref 13.0–18.0)
LYMPHS ABS: 2.9 10*3/uL (ref 1.0–3.6)
Lymphocyte %: 21 %
MCH: 30.5 pg (ref 26.0–34.0)
MCHC: 33 g/dL (ref 32.0–36.0)
MCV: 92 fL (ref 80–100)
MONO ABS: 1.2 x10 3/mm — AB (ref 0.2–1.0)
Monocyte %: 8.9 %
NEUTROS ABS: 9.5 10*3/uL — AB (ref 1.4–6.5)
Neutrophil %: 68.8 %
Platelet: 197 10*3/uL (ref 150–440)
RBC: 4.42 10*6/uL (ref 4.40–5.90)
RDW: 13.4 % (ref 11.5–14.5)
WBC: 13.8 10*3/uL — AB (ref 3.8–10.6)

## 2014-03-30 LAB — URINALYSIS, COMPLETE
Bilirubin,UR: NEGATIVE
Blood: NEGATIVE
Glucose,UR: NEGATIVE mg/dL (ref 0–75)
Leukocyte Esterase: NEGATIVE
Nitrite: NEGATIVE
PH: 8 (ref 4.5–8.0)
RBC,UR: 2 /HPF (ref 0–5)
Specific Gravity: 1.023 (ref 1.003–1.030)
Squamous Epithelial: NONE SEEN
WBC UR: 3 /HPF (ref 0–5)

## 2014-03-30 LAB — COMPREHENSIVE METABOLIC PANEL
ALBUMIN: 3.9 g/dL (ref 3.4–5.0)
ALT: 91 U/L — AB
ANION GAP: 8 (ref 7–16)
AST: 61 U/L — AB (ref 15–37)
Alkaline Phosphatase: 84 U/L
BUN: 5 mg/dL — ABNORMAL LOW (ref 7–18)
Bilirubin,Total: 0.7 mg/dL (ref 0.2–1.0)
Calcium, Total: 9.1 mg/dL (ref 8.5–10.1)
Chloride: 107 mmol/L (ref 98–107)
Co2: 26 mmol/L (ref 21–32)
Creatinine: 0.97 mg/dL (ref 0.60–1.30)
EGFR (African American): >60
GLUCOSE: 109 mg/dL — AB (ref 65–99)
Osmolality: 279 (ref 275–301)
Potassium: 3.6 mmol/L (ref 3.5–5.1)
Sodium: 141 mmol/L (ref 136–145)
Total Protein: 8 g/dL (ref 6.4–8.2)

## 2014-03-30 LAB — LIPASE, BLOOD: Lipase: 126 U/L (ref 73–393)

## 2014-03-31 ENCOUNTER — Emergency Department (HOSPITAL_COMMUNITY)
Admission: EM | Admit: 2014-03-31 | Discharge: 2014-03-31 | Disposition: A | Discharge status: Home / Self Care | Payer: Self-pay | Attending: Emergency Medicine | Admitting: Emergency Medicine

## 2014-03-31 ENCOUNTER — Emergency Department: Payer: Self-pay | Admitting: Emergency Medicine

## 2014-03-31 ENCOUNTER — Encounter (HOSPITAL_COMMUNITY): Payer: Self-pay | Admitting: Emergency Medicine

## 2014-03-31 DIAGNOSIS — Z720 Tobacco use: Secondary | ICD-10-CM | POA: Insufficient documentation

## 2014-03-31 DIAGNOSIS — K861 Other chronic pancreatitis: Secondary | ICD-10-CM | POA: Insufficient documentation

## 2014-03-31 DIAGNOSIS — Z8619 Personal history of other infectious and parasitic diseases: Secondary | ICD-10-CM | POA: Insufficient documentation

## 2014-03-31 DIAGNOSIS — Z79899 Other long term (current) drug therapy: Secondary | ICD-10-CM | POA: Insufficient documentation

## 2014-03-31 DIAGNOSIS — Z880 Allergy status to penicillin: Secondary | ICD-10-CM | POA: Insufficient documentation

## 2014-03-31 LAB — COMPREHENSIVE METABOLIC PANEL
ALBUMIN: 4.4 g/dL (ref 3.5–5.2)
ALT: 67 U/L — ABNORMAL HIGH (ref 0–53)
ANION GAP: 5 (ref 5–15)
AST: 40 U/L — ABNORMAL HIGH (ref 0–37)
Alkaline Phosphatase: 74 U/L (ref 39–117)
BILIRUBIN TOTAL: 0.8 mg/dL (ref 0.3–1.2)
BUN: 8 mg/dL (ref 6–23)
CHLORIDE: 102 meq/L (ref 96–112)
CO2: 29 mmol/L (ref 19–32)
Calcium: 9.1 mg/dL (ref 8.4–10.5)
Creatinine, Ser: 1.06 mg/dL (ref 0.50–1.35)
GFR calc Af Amer: >90 mL/min (ref >90)
Glucose, Bld: 110 mg/dL — ABNORMAL HIGH (ref 70–99)
Potassium: 3.2 mmol/L — ABNORMAL LOW (ref 3.5–5.1)
Sodium: 136 mmol/L (ref 135–145)
Total Protein: 7.7 g/dL (ref 6.0–8.3)

## 2014-03-31 LAB — CBC WITH DIFFERENTIAL/PLATELET
Basophils Absolute: 0.1 10*3/uL (ref 0.0–0.1)
Basophils Relative: 1 % (ref 0–1)
Eosinophils Absolute: 0.2 10*3/uL (ref 0.0–0.7)
Eosinophils Relative: 1 % (ref 0–5)
HCT: 38.7 % — ABNORMAL LOW (ref 39.0–52.0)
HEMOGLOBIN: 12.7 g/dL — AB (ref 13.0–17.0)
Lymphocytes Relative: 22 % (ref 12–46)
Lymphs Abs: 2.9 10*3/uL (ref 0.7–4.0)
MCH: 30.2 pg (ref 26.0–34.0)
MCHC: 32.8 g/dL (ref 30.0–36.0)
MCV: 92.1 fL (ref 78.0–100.0)
Monocytes Absolute: 0.9 10*3/uL (ref 0.1–1.0)
Monocytes Relative: 7 % (ref 3–12)
NEUTROS ABS: 9.1 10*3/uL — AB (ref 1.7–7.7)
Neutrophils Relative %: 69 % (ref 43–77)
Platelets: 220 10*3/uL (ref 150–400)
RBC: 4.2 MIL/uL — ABNORMAL LOW (ref 4.22–5.81)
RDW: 13.2 % (ref 11.5–15.5)
WBC: 13.1 10*3/uL — ABNORMAL HIGH (ref 4.0–10.5)

## 2014-03-31 LAB — LIPASE, BLOOD: LIPASE: 18 U/L (ref 11–59)

## 2014-03-31 MED ORDER — ONDANSETRON 8 MG PO TBDP
8.0000 mg | ORAL_TABLET | Freq: Once | ORAL | Status: DC
  Filled 2014-03-31: qty 1

## 2014-03-31 MED ORDER — LORAZEPAM 2 MG/ML IJ SOLN
1.0000 mg | Freq: Once | INTRAMUSCULAR | Status: AC
  Administered 2014-03-31: 1 mg via INTRAVENOUS
  Filled 2014-03-31: qty 1

## 2014-03-31 MED ORDER — PROMETHAZINE HCL 25 MG PO TABS: 25.0000 mg | ORAL_TABLET | Freq: Four times a day (QID) | ORAL | Status: DC | PRN

## 2014-03-31 MED ORDER — HALOPERIDOL LACTATE 5 MG/ML IJ SOLN
5.0000 mg | Freq: Once | INTRAMUSCULAR | Status: DC
  Filled 2014-03-31: qty 1

## 2014-03-31 MED ORDER — PROMETHAZINE HCL 25 MG/ML IJ SOLN
25.0000 mg | Freq: Once | INTRAMUSCULAR | Status: AC
  Administered 2014-03-31: 25 mg via INTRAVENOUS
  Filled 2014-03-31: qty 1

## 2014-03-31 MED ORDER — GI COCKTAIL ~~LOC~~
30.0000 mL | Freq: Once | ORAL | Status: DC
  Filled 2014-03-31: qty 30

## 2014-03-31 MED ORDER — HALOPERIDOL LACTATE 5 MG/ML IJ SOLN
2.0000 mg | Freq: Once | INTRAMUSCULAR | Status: AC
  Administered 2014-03-31: 2 mg via INTRAVENOUS

## 2014-03-31 MED ORDER — ONDANSETRON 8 MG PO TBDP
8.0000 mg | ORAL_TABLET | Freq: Once | ORAL | Status: AC
  Administered 2014-03-31: 8 mg via ORAL
  Filled 2014-03-31: qty 1

## 2014-03-31 MED ORDER — SODIUM CHLORIDE 0.9 % IV BOLUS (SEPSIS)
1000.0000 mL | Freq: Once | INTRAVENOUS | Status: AC
  Administered 2014-03-31: 1000 mL via INTRAVENOUS

## 2014-03-31 NOTE — ED Notes (Signed)
Per patient has a history of pancreatitis, vomiting, pain-states he has been worked up at other hospitals but not getting any relief

## 2014-03-31 NOTE — ED Notes (Signed)
Patient did not want this writer to review d/c instructions/prescription. "Everything you're going to read, I already know". Patient ambulatory upon d/c in NAD. Patient reported ride has been called. Patient to wait in waiting room for ride.

## 2014-03-31 NOTE — ED Notes (Signed)
Bed: GY18 Expected date:  Expected time:  Means of arrival:  Comments: EMS- n/v/d, syncope, IV established

## 2014-03-31 NOTE — ED Notes (Signed)
2 Unsuccessful IV attempts 

## 2014-03-31 NOTE — ED Provider Notes (Signed)
CSN: [id number]     Arrival date & time 03/31/14  1651 History   First MD Initiated Contact with Patient 03/31/14 1916     Chief Complaint  Patient presents with  . Abdominal Pain     (Consider location/radiation/quality/duration/timing/severity/associated sxs/prior Treatment) Patient is a 28 y.o. male presenting with abdominal pain. The history is provided by the patient.  Abdominal Pain Pain location:  Epigastric Pain quality: aching   Pain radiates to:  Does not radiate Pain severity:  Severe Onset quality:  Gradual Duration:  3 days Timing:  Constant Progression:  Worsening Chronicity:  Chronic Context: not alcohol use, not sick contacts and not trauma   Relieved by:  Nothing Worsened by:  Nothing tried Associated symptoms: vomiting   Associated symptoms: no cough, no fever and no shortness of breath     Past Medical History  Diagnosis Date  . Pancreatitis   . Hepatitis C   . Gastritis    History reviewed. No pertinent past surgical history. Family History  Problem Relation Age of Onset  . CAD Father    History  Substance Use Topics  . Smoking status: Heavy Tobacco Smoker -- 1.00 packs/day  . Smokeless tobacco: Not on file  . Alcohol Use: No    Review of Systems  Constitutional: Negative for fever.  Respiratory: Negative for cough and shortness of breath.   Gastrointestinal: Positive for vomiting and abdominal pain.  All other systems reviewed and are negative.     Allergies  Penicillin g; Shellfish allergy; and Sulfa antibiotics  Home Medications   Prior to Admission medications   Medication Sig Start Date End Date Taking? Authorizing Provider  dicyclomine (BENTYL) 20 MG tablet Take 20 mg by mouth 2 (two) times daily. 02/03/14 02/03/15 Yes Historical Provider, MD  famotidine (PEPCID) 20 MG tablet Take 20 mg by mouth 2 (two) times daily.   Yes Historical Provider, MD  omeprazole (PRILOSEC) 20 MG capsule Take 20 mg by mouth 2 (two) times daily.   Yes  Historical Provider, MD  ondansetron (ZOFRAN) 4 MG tablet Take 4 mg by mouth as needed. Nausea/vomiting   Yes Historical Provider, MD  Oxycodone HCl 10 MG TABS Take 5-10 mg by mouth every 4 (four) hours as needed (nausea/vomiting).   Yes Historical Provider, MD  oxyCODONE-acetaminophen (PERCOCET/ROXICET) 5-325 MG per tablet Take 1 tablet by mouth every 6 (six) hours as needed. pain 03/30/14 04/02/14 Yes Historical Provider, MD  promethazine (PHENERGAN) 25 MG tablet Take 25 mg by mouth every 6 (six) hours as needed. Nausea/ up to 20 doses 03/29/14  Yes Historical Provider, MD   BP 156/102 mmHg  Pulse 84  Temp(Src) 98.3 F (36.8 C) (Oral)  Resp 20  SpO2 100% Physical Exam  Constitutional: He is oriented to person, place, and time. He appears well-developed and well-nourished. No distress.  HENT:  Head: Normocephalic and atraumatic.  Mouth/Throat: No oropharyngeal exudate.  Eyes: EOM are normal. Pupils are equal, round, and reactive to light.  Neck: Normal range of motion. Neck supple.  Cardiovascular: Normal rate and regular rhythm.  Exam reveals no friction rub.   No murmur heard. Pulmonary/Chest: Effort normal and breath sounds normal. No respiratory distress. He has no wheezes. He has no rales.  Abdominal: He exhibits no distension. There is tenderness (epigastric). There is no rebound.  Musculoskeletal: Normal range of motion. He exhibits no edema.  Neurological: He is alert and oriented to person, place, and time.  Skin: He is not diaphoretic.  Nursing note and  vitals reviewed.   ED Course  Procedures (including critical care time) Labs Review Labs Reviewed  CBC WITH DIFFERENTIAL - Abnormal; Notable for the following:    WBC 13.1 (*)    RBC 4.20 (*)    Hemoglobin 12.7 (*)    HCT 38.7 (*)    Neutro Abs 9.1 (*)    All other components within normal limits  COMPREHENSIVE METABOLIC PANEL - Abnormal; Notable for the following:    Potassium 3.2 (*)    Glucose, Bld 110 (*)     AST 40 (*)    ALT 67 (*)    All other components within normal limits  LIPASE, BLOOD    Imaging Review No results found.   EKG Interpretation None      MDM   Final diagnoses:  Chronic pancreatitis, unspecified pancreatitis type    5M here with chronic pancreatitis. Patient seen yesterday at [facility], received stadol and had negative CT 2 days ago. Here again with persistent vomiting, very aggressive, asking for 2 mg of Dilaudid. Had epigastric pain on exam. Asking for IV fluids, will provide, but reiterated to him I will not provide narcotics. I do not feel stadol is warranted as it is a narcotic. Will try 2 mg of haldol for his chronic pain. Labs here all ok. Patient given ativan for his nausea. Patient stable for discharge. I explained he will not get narcotics if admitted and he was ok with going.  Evelina Bucy, MD 04/01/14 [phone]

## 2014-03-31 NOTE — ED Notes (Signed)
Patient reporting pain has traveled to lower back at this time. Patient ambulatory to bathroom in NAD. EDP made aware of same.

## 2014-03-31 NOTE — ED Notes (Signed)
Informed MD Lowella Fairy that patient was requesting to be seen ASAP-informed MD that patient had care plan-ordered Zofran for nausea

## 2014-04-01 ENCOUNTER — Emergency Department: Payer: Self-pay | Admitting: Emergency Medicine

## 2014-04-01 LAB — CBC WITH DIFFERENTIAL/PLATELET
BASOS ABS: 0.1 10*3/uL (ref 0.0–0.1)
Basophil %: 0.6 %
Eosinophil #: 0.1 10*3/uL (ref 0.0–0.7)
Eosinophil %: 0.5 %
HCT: 40.1 % (ref 40.0–52.0)
HGB: 13 g/dL (ref 13.0–18.0)
Lymphocyte #: 3 10*3/uL (ref 1.0–3.6)
Lymphocyte %: 20.8 %
MCH: 30.3 pg (ref 26.0–34.0)
MCHC: 32.6 g/dL (ref 32.0–36.0)
MCV: 93 fL (ref 80–100)
MONOS PCT: 6.9 %
Monocyte #: 1 x10 3/mm (ref 0.2–1.0)
NEUTROS PCT: 71.2 %
Neutrophil #: 10.4 10*3/uL — ABNORMAL HIGH (ref 1.4–6.5)
PLATELETS: 195 10*3/uL (ref 150–440)
RBC: 4.3 10*6/uL — ABNORMAL LOW (ref 4.40–5.90)
RDW: 13.4 % (ref 11.5–14.5)
WBC: 14.6 10*3/uL — AB (ref 3.8–10.6)

## 2014-04-01 LAB — COMPREHENSIVE METABOLIC PANEL
AST: 42 U/L — AB (ref 15–37)
Albumin: 4.1 g/dL (ref 3.4–5.0)
Alkaline Phosphatase: 86 U/L
Anion Gap: 5 — ABNORMAL LOW (ref 7–16)
BUN: 6 mg/dL — ABNORMAL LOW (ref 7–18)
Bilirubin,Total: 0.7 mg/dL (ref 0.2–1.0)
CHLORIDE: 103 mmol/L (ref 98–107)
CREATININE: 1.14 mg/dL (ref 0.60–1.30)
Calcium, Total: 9 mg/dL (ref 8.5–10.1)
Co2: 30 mmol/L (ref 21–32)
EGFR (African American): >60
EGFR (Non-African Amer.): >60
GLUCOSE: 110 mg/dL — AB (ref 65–99)
OSMOLALITY: 274 (ref 275–301)
POTASSIUM: 3.5 mmol/L (ref 3.5–5.1)
SGPT (ALT): 91 U/L — ABNORMAL HIGH
Sodium: 138 mmol/L (ref 136–145)
Total Protein: 8.1 g/dL (ref 6.4–8.2)

## 2014-04-01 LAB — LIPASE, BLOOD: Lipase: 43 U/L — ABNORMAL LOW (ref 73–393)

## 2014-04-01 LAB — TROPONIN I

## 2014-04-23 ENCOUNTER — Emergency Department: Payer: Self-pay | Admitting: Emergency Medicine

## 2014-04-23 LAB — COMPREHENSIVE METABOLIC PANEL
ALBUMIN: 4 g/dL (ref 3.4–5.0)
ALK PHOS: 92 U/L
ALT: 172 U/L — AB
Anion Gap: 6 — ABNORMAL LOW (ref 7–16)
BUN: 11 mg/dL (ref 7–18)
Bilirubin,Total: 0.4 mg/dL (ref 0.2–1.0)
CO2: 27 mmol/L (ref 21–32)
Calcium, Total: 9.5 mg/dL (ref 8.5–10.1)
Chloride: 107 mmol/L (ref 98–107)
Creatinine: 1.04 mg/dL (ref 0.60–1.30)
EGFR (African American): >60
GLUCOSE: 110 mg/dL — AB (ref 65–99)
OSMOLALITY: 279 (ref 275–301)
Potassium: 4 mmol/L (ref 3.5–5.1)
SGOT(AST): 82 U/L — ABNORMAL HIGH (ref 15–37)
SODIUM: 140 mmol/L (ref 136–145)
Total Protein: 8.4 g/dL — ABNORMAL HIGH (ref 6.4–8.2)

## 2014-04-23 LAB — CBC WITH DIFFERENTIAL/PLATELET
BASOS PCT: 1.3 %
Basophil #: 0.1 10*3/uL (ref 0.0–0.1)
EOS PCT: 2.9 %
Eosinophil #: 0.3 10*3/uL (ref 0.0–0.7)
HCT: 43.4 % (ref 40.0–52.0)
HGB: 14 g/dL (ref 13.0–18.0)
LYMPHS ABS: 2.7 10*3/uL (ref 1.0–3.6)
Lymphocyte %: 32 %
MCH: 30.7 pg (ref 26.0–34.0)
MCHC: 32.3 g/dL (ref 32.0–36.0)
MCV: 95 fL (ref 80–100)
MONOS PCT: 8.9 %
Monocyte #: 0.8 x10 3/mm (ref 0.2–1.0)
Neutrophil #: 4.7 10*3/uL (ref 1.4–6.5)
Neutrophil %: 54.9 %
Platelet: 218 10*3/uL (ref 150–440)
RBC: 4.57 10*6/uL (ref 4.40–5.90)
RDW: 13.6 % (ref 11.5–14.5)
WBC: 8.6 10*3/uL (ref 3.8–10.6)

## 2014-04-23 LAB — LIPASE, BLOOD: Lipase: 359 U/L (ref 73–393)

## 2014-04-24 ENCOUNTER — Inpatient Hospital Stay: Payer: Self-pay | Admitting: Internal Medicine

## 2014-04-24 LAB — COMPREHENSIVE METABOLIC PANEL
ALBUMIN: 3.6 g/dL (ref 3.4–5.0)
AST: 55 U/L — AB (ref 15–37)
Alkaline Phosphatase: 84 U/L
Anion Gap: 8 (ref 7–16)
BUN: 8 mg/dL (ref 7–18)
Bilirubin,Total: 0.4 mg/dL (ref 0.2–1.0)
Calcium, Total: 9 mg/dL (ref 8.5–10.1)
Chloride: 107 mmol/L (ref 98–107)
Co2: 25 mmol/L (ref 21–32)
Creatinine: 0.93 mg/dL (ref 0.60–1.30)
EGFR (African American): >60
EGFR (Non-African Amer.): >60
GLUCOSE: 70 mg/dL (ref 65–99)
Osmolality: 276 (ref 275–301)
POTASSIUM: 3.7 mmol/L (ref 3.5–5.1)
SGPT (ALT): 142 U/L — ABNORMAL HIGH
Sodium: 140 mmol/L (ref 136–145)
Total Protein: 7.4 g/dL (ref 6.4–8.2)

## 2014-04-24 LAB — LIPASE, BLOOD: Lipase: 700 U/L — ABNORMAL HIGH (ref 73–393)

## 2014-04-24 LAB — CBC
HCT: 40.8 % (ref 40.0–52.0)
HGB: 13.3 g/dL (ref 13.0–18.0)
MCH: 31 pg (ref 26.0–34.0)
MCHC: 32.7 g/dL (ref 32.0–36.0)
MCV: 95 fL (ref 80–100)
Platelet: 174 10*3/uL (ref 150–440)
RBC: 4.31 10*6/uL — AB (ref 4.40–5.90)
RDW: 13.9 % (ref 11.5–14.5)
WBC: 10.1 10*3/uL (ref 3.8–10.6)

## 2014-04-25 LAB — LIPASE, BLOOD: Lipase: 61 U/L — ABNORMAL LOW (ref 73–393)

## 2014-04-25 LAB — LIPID PANEL
Cholesterol: 118 mg/dL (ref 0–200)
HDL Cholesterol: 28 mg/dL — ABNORMAL LOW (ref 40–60)
Ldl Cholesterol, Calc: 66 mg/dL (ref 0–100)
Triglycerides: 122 mg/dL (ref 0–200)
VLDL Cholesterol, Calc: 24 mg/dL (ref 5–40)

## 2014-04-27 LAB — LIPASE, BLOOD: Lipase: 54 U/L — ABNORMAL LOW (ref 73–393)

## 2014-04-28 LAB — CREATININE, SERUM
Creatinine: 1.03 mg/dL (ref 0.60–1.30)
EGFR (Non-African Amer.): >60

## 2014-04-28 LAB — COMPREHENSIVE METABOLIC PANEL
ALT: 179 U/L — AB (ref 14–63)
Albumin: 3.3 g/dL — ABNORMAL LOW (ref 3.4–5.0)
Alkaline Phosphatase: 82 U/L (ref 46–116)
Anion Gap: 7 (ref 7–16)
BUN: 12 mg/dL (ref 7–18)
Bilirubin,Total: 0.2 mg/dL (ref 0.2–1.0)
CALCIUM: 8.7 mg/dL (ref 8.5–10.1)
CHLORIDE: 106 mmol/L (ref 98–107)
Co2: 28 mmol/L (ref 21–32)
Creatinine: 1.06 mg/dL (ref 0.60–1.30)
EGFR (African American): >60
Glucose: 94 mg/dL (ref 65–99)
Osmolality: 281 (ref 275–301)
Potassium: 4.1 mmol/L (ref 3.5–5.1)
SGOT(AST): 83 U/L — ABNORMAL HIGH (ref 15–37)
Sodium: 141 mmol/L (ref 136–145)
Total Protein: 6.8 g/dL (ref 6.4–8.2)

## 2014-04-28 LAB — LIPASE, BLOOD: LIPASE: 58 U/L — AB (ref 73–393)

## 2014-04-29 LAB — FERRITIN: Ferritin (ARMC): 96 ng/mL (ref 8–388)

## 2014-04-29 LAB — IRON AND TIBC
Iron Bind.Cap.(Total): 355 ug/dL (ref 250–450)
Iron Saturation: 16 %
Iron: 57 ug/dL — ABNORMAL LOW (ref 65–175)
Unbound Iron-Bind.Cap.: 298 ug/dL

## 2014-05-01 LAB — AFP TUMOR MARKER: AFP TUMOR MARKER: 4 ng/mL (ref 0.0–8.3)

## 2014-07-10 ENCOUNTER — Emergency Department
Admit: 2014-07-10 | Disposition: A | Discharge status: Home / Self Care | Payer: Self-pay | Admitting: Emergency Medicine

## 2014-07-10 LAB — CBC
HCT: 44.7 % (ref 40.0–52.0)
HGB: 14.7 g/dL (ref 13.0–18.0)
MCH: 30.4 pg (ref 26.0–34.0)
MCHC: 32.9 g/dL (ref 32.0–36.0)
MCV: 93 fL (ref 80–100)
Platelet: 175 10*3/uL (ref 150–440)
RBC: 4.83 10*6/uL (ref 4.40–5.90)
RDW: 13.5 % (ref 11.5–14.5)
WBC: 12.5 10*3/uL — AB (ref 3.8–10.6)

## 2014-07-10 LAB — COMPREHENSIVE METABOLIC PANEL
Albumin: 4.6 g/dL
Alkaline Phosphatase: 78 U/L
Anion Gap: 8 (ref 7–16)
BUN: 13 mg/dL
Bilirubin,Total: 0.7 mg/dL
CALCIUM: 9.4 mg/dL
CREATININE: 1.13 mg/dL
Chloride: 106 mmol/L
Co2: 26 mmol/L
GLUCOSE: 127 mg/dL — AB
Potassium: 3.6 mmol/L
SGOT(AST): 50 U/L — ABNORMAL HIGH
SGPT (ALT): 93 U/L — ABNORMAL HIGH
Sodium: 140 mmol/L
Total Protein: 8.4 g/dL — ABNORMAL HIGH

## 2014-07-10 LAB — LIPASE, BLOOD: Lipase: 45 U/L

## 2014-07-11 ENCOUNTER — Emergency Department
Admit: 2014-07-11 | Disposition: A | Discharge status: Home / Self Care | Payer: Self-pay | Admitting: Emergency Medicine

## 2014-07-11 LAB — COMPREHENSIVE METABOLIC PANEL
ALBUMIN: 4.4 g/dL
ANION GAP: 10 (ref 7–16)
Alkaline Phosphatase: 76 U/L
BUN: 11 mg/dL
Bilirubin,Total: 0.9 mg/dL
CALCIUM: 9.1 mg/dL
CO2: 26 mmol/L
CREATININE: 1.05 mg/dL
Chloride: 101 mmol/L
EGFR (Non-African Amer.): >60
Glucose: 113 mg/dL — ABNORMAL HIGH
POTASSIUM: 3.5 mmol/L
SGOT(AST): 47 U/L — ABNORMAL HIGH
SGPT (ALT): 85 U/L — ABNORMAL HIGH
SODIUM: 137 mmol/L
Total Protein: 8.1 g/dL

## 2014-07-11 LAB — CBC
HCT: 44.3 % (ref 40.0–52.0)
HGB: 14.8 g/dL (ref 13.0–18.0)
MCH: 30.7 pg (ref 26.0–34.0)
MCHC: 33.4 g/dL (ref 32.0–36.0)
MCV: 92 fL (ref 80–100)
Platelet: 171 10*3/uL (ref 150–440)
RBC: 4.84 10*6/uL (ref 4.40–5.90)
RDW: 13.8 % (ref 11.5–14.5)
WBC: 10.3 10*3/uL (ref 3.8–10.6)

## 2014-07-11 LAB — LIPASE, BLOOD: LIPASE: 47 U/L

## 2014-07-18 ENCOUNTER — Emergency Department
Admit: 2014-07-18 | Disposition: A | Discharge status: Home / Self Care | Payer: Self-pay | Admitting: Emergency Medicine

## 2014-07-19 LAB — COMPREHENSIVE METABOLIC PANEL
AST: 72 U/L — AB
Albumin: 4.6 g/dL
Alkaline Phosphatase: 74 U/L
Anion Gap: 8 (ref 7–16)
BUN: 17 mg/dL
Bilirubin,Total: 0.4 mg/dL
CALCIUM: 9.2 mg/dL
Chloride: 106 mmol/L
Co2: 28 mmol/L
Creatinine: 1.12 mg/dL
EGFR (Non-African Amer.): >60
GLUCOSE: 98 mg/dL
Potassium: 3.7 mmol/L
SGPT (ALT): 100 U/L — ABNORMAL HIGH
Sodium: 142 mmol/L
Total Protein: 8.4 g/dL — ABNORMAL HIGH

## 2014-07-19 LAB — CBC WITH DIFFERENTIAL/PLATELET
Basophil #: 0.2 10*3/uL — ABNORMAL HIGH (ref 0.0–0.1)
Basophil %: 1 %
EOS PCT: 1.6 %
Eosinophil #: 0.3 10*3/uL (ref 0.0–0.7)
HCT: 43.3 % (ref 40.0–52.0)
HGB: 14.4 g/dL (ref 13.0–18.0)
LYMPHS ABS: 4.9 10*3/uL — AB (ref 1.0–3.6)
Lymphocyte %: 30.4 %
MCH: 30.6 pg (ref 26.0–34.0)
MCHC: 33.2 g/dL (ref 32.0–36.0)
MCV: 92 fL (ref 80–100)
MONO ABS: 1.1 x10 3/mm — AB (ref 0.2–1.0)
Monocyte %: 6.7 %
NEUTROS ABS: 9.7 10*3/uL — AB (ref 1.4–6.5)
Neutrophil %: 60.3 %
Platelet: 209 10*3/uL (ref 150–440)
RBC: 4.69 10*6/uL (ref 4.40–5.90)
RDW: 14.1 % (ref 11.5–14.5)
WBC: 16.1 10*3/uL — AB (ref 3.8–10.6)

## 2014-07-19 LAB — TROPONIN I: Troponin-I: <0.03 ng/mL

## 2014-07-19 LAB — LIPASE, BLOOD: LIPASE: 31 U/L

## 2014-07-26 NOTE — Discharge Summary (Signed)
PATIENT NAME:  Albert Peters, Albert Peters MR#:  [id number] DATE OF BIRTH:  03-01-1986  DATE OF ADMISSION:  07/06/2011 DATE OF DISCHARGE:  07/07/2011  ADMITTING PHYSICIAN: Fulton Reek, MD   The patient left AGAINST MEDICAL ADVICE on 07/07/2011.  CONSULTATION IN THE HOSPITAL: GI consultation by Dr. Gustavo Lah    FINAL DIAGNOSES:  1. Acute pancreatitis.  2. Narcotic medication seeking behavior while in the hospital. 3. History of gastritis. 4. Tobacco use disorder.  5. Leukocytosis.  LABS PRIOR TO LEAVING AGAINST MEDICAL ADVICE: WBC 11.5, hemoglobin 11.8, hematocrit 35.6, platelet count 135, sodium 141, potassium 3.7, chloride 104, bicarb 28, BUN 10, creatinine 1.11, glucose 87, calcium 8.5, ALT 16,  AST 22, alkaline phosphatase 38, total bilirubin 0.5, albumin 3.2. Lipase 207 prior to discharge. Urinalysis normal. Lipase on admission was slightly elevated at 874.  BRIEF HOSPITAL COURSE: Albert Peters is a 29 year old African American male with no significant past medical history other than prior admissions for pancreatitis, gastritis, and history of narcotic seeing behavior and leaving [facility] once pain medications were changed from IV to oral. He came in again with nausea, vomiting, and abdominal pain. The patient appeared comfortable in bed so his IV Dilaudid was changed to morphine and also oral medications which he did not like and left AGAINST MEDICAL ADVICE. He didn't have any nausea or vomiting and his lipase normalized prior to discharge and he was tolerating liquid diet well. IgG and lipid panel were ordered to rule out autoimmune cause for pancreatitis and hypertriglyceridemia, however, the patient left prior to getting the blood work done.      ADDITIONAL TIME SPENT: 30 minutes.   ____________________________ Gladstone Lighter, MD rk:drc D: 07/09/2011 11:57:26 ET T: 07/10/2011 13:53:19 ET JOB#: [id number]  cc: Gladstone Lighter, MD, <Dictator> Gladstone Lighter  MD ELECTRONICALLY SIGNED 07/12/2011 13:43

## 2014-07-26 NOTE — Consult Note (Signed)
Brief Consult Note: Diagnosis: pancreatitis.   Patient was seen by consultant.   Consult note dictated.   Comments: Appreciate consult for 29 y/o [place] American man with history of gastritis and pancreatitis for pancreatitis. States he has been seen and released in ED several times over the last few weeks with abdominal pain. Increased yesterday after eating [employer]: represented to the ED and was admitted with elevated wbc and lipase. Feeling better today. Lipase normal today. States that his abdominal pain has been epigastric area and he had vomited nonbloody material several times prior to admission. States that some greasy foods aggravate his abdominal discomfort, but not all.  Dilaudid has improved pain. States he is not consuming etoh or taking illicits. No history of gallstones. No calcifications in pancreas mentioned on previous imaging studies. Single episode of watery diarrhea only, states it has stopped.   Impression: Acute episode of recurrent pancreatitis. Noted transient increases and decreases in lipase over the last 3 yrs. No evidence radiographically of pseudocyst, calcifcations, masses, or gallstones. Not on meds that cause pancreatitis.  Does have history of alcohol abuse.  Would recommend IgG4 and lipid panel to rule out autoimmune cause/hypertriglyceridemia.  Electronic Signatures: Stephens November H (NP)  (Signed Earvin Hansen 20:33)  Authored: Brief Consult Note   Last Updated: 05-Apr-13 20:33 by Theodore Demark (NP)

## 2014-07-26 NOTE — H&P (Signed)
PATIENT NAME:  Albert Peters, Albert Peters MR#:  [id number] DATE OF BIRTH:  03/10/1986  DATE OF ADMISSION:  07/06/2011  REFERRING PHYSICIAN: Lisa Roca, MD  PRIMARY CARE PHYSICIAN: None.   REASON FOR ADMISSION: Intractable nausea, vomiting, and diarrhea with abdominal pain associated with acute pancreatitis.   HISTORY OF PRESENT ILLNESS: The patient is a 29 year old male with a history of gastritis and pancreatitis who presents to the emergency room for the fourth time in as many days with abdominal pain associated with nausea, vomiting, and diarrhea. He was given pain medication in the emergency room two days ago with no improvement. He presents now with worsening symptoms of abdominal pain. He cannot keep anything down. He is having watery stools. He appears in severe pain in the emergency room.  Lipase is elevated. He is now admitted for further evaluation.   PAST MEDICAL HISTORY:  1. Acute pancreatitis.  2. History of gastritis.  3. Remote history of marijuana use.  4. Tobacco abuse.  5. History of alcohol abuse.  6. Chronic pain.   MEDICATIONS: Zofran 4 mg p.o. every 8 hours p.r.n.   ALLERGIES: No known drug allergies.   SOCIAL HISTORY: The patient smokes one pack per day. Denies recent marijuana use. Denies recent alcohol use. Lives by himself.   FAMILY HISTORY: Positive for hypertension.   REVIEW OF SYSTEMS: CONSTITUTIONAL: No fever or change in weight. EYES: No blurred or double vision. No glaucoma. ENT: No tinnitus or hearing loss. No nasal discharge or bleeding. No difficulty swallowing. RESPIRATORY: No cough or wheezing. Denies hemoptysis. CARDIOVASCULAR: No chest pain or orthopnea. No palpitations or syncope. GI: No hematemesis. GU: No dysuria or hematuria. No incontinence. ENDOCRINE: No polyuria or polydipsia. No heat or cold intolerance. HEMATOLOGIC: The patient denies anemia, easy bruising, or bleeding. LYMPHATIC: No swollen glands. MUSCULOSKELETAL: The patient denies pain in his  neck, back, shoulders, knees, or hips. No gout. NEUROLOGIC: No numbness. No migraines. Denies stroke or seizures. PSYCH: The patient denies anxiety, insomnia, or depression.   PHYSICAL EXAMINATION:   GENERAL: The patient is acutely ill-appearing, in moderate distress.   VITAL SIGNS: Vital signs are currently remarkable for a blood pressure of 154/84 with a heart rate of 99 and a respiratory rate of 20. He is afebrile.   HEENT: Normocephalic, atraumatic. Pupils equally round and reactive to light and accommodation. Extraocular movements are intact. Sclerae anicteric. Conjunctivae are clear. Oropharynx is clear.   NECK: Supple without jugular venous distention or bruits. No adenopathy or thyromegaly is noted.   LUNGS: Clear to auscultation and percussion without wheezes, rales, or rhonchi. No dullness.   HEART: Regular rate and rhythm with normal S1 and S2. No significant rubs, murmurs, or gallops. PMI is nondisplaced. Chest wall is nontender.   ABDOMEN: Soft but tender in the epigastrium. No rebound although guarding is present. No organomegaly or masses were appreciated. Normoactive bowel sounds. No hernias or bruits were noted.   EXTREMITIES: No clubbing, cyanosis, or edema. Pulses were 2+ bilaterally.   SKIN: Warm and dry without rash or lesions.   NEUROLOGIC: Cranial nerves II through XII grossly intact. Deep tendon reflexes were symmetric. Motor and sensory examination is nonfocal.   PSYCH: The patient was alert and oriented to person, place, and time. He was cooperative and used good judgment.  LABS/STUDIES: Lipase was 874. White count was 12.7 with a hemoglobin of 14.1. Glucose was 110 with a BUN of 21 and a creatinine 1.19 with a GFR of greater than 60. Potassium was 3.5  with a sodium of 139.   Urinalysis was negative.   ASSESSMENT:  1. Acute/recurrent pancreatitis.  2. Intractable nausea and vomiting.  3. Diarrhea.  4. Abdominal pain.  5. Mild dehydration.   6. Leukocytosis.  7. Hyperglycemia.   PLAN: The patient will be admitted to the floor with IV fluids and kept n.p.o., except for ice and medications. We will use Zofran as needed for nausea and vomiting and Dilaudid as needed for pain. We will consult Gastroenterology. We will not repeat imaging of his abdomen at this time as he had a CT of his abdomen fairly recently. We will begin IV Protonix because of his history of gastritis. We will guaiac all stools. Follow up routine labs in the morning, including lipase. Further treatment and evaluation will depend upon the patient's progress.  ____________________________ Leonie Douglas Doy Hutching, MD jds:slb D: 07/06/2011 16:31:54 ET T: 07/06/2011 17:12:24 ET JOB#: [id number]  cc: Leonie Douglas. Doy Hutching, MD, <Dictator> Desmond Szabo Lennice Sites MD ELECTRONICALLY SIGNED 07/06/2011 17:45

## 2014-07-26 NOTE — Consult Note (Signed)
PATIENT NAME:  Albert Peters, Albert Peters MR#:  [id number] DATE OF BIRTH:  [birth year]  DATE OF CONSULTATION:  07/07/2011  REFERRING PHYSICIAN:   CONSULTING PHYSICIAN:  Theodore Demark, NP  PRIMARY CARE PHYSICIAN: None.   REASON FOR ADMISSION: Pancreatitis. Please refer to the admission History and Physical for full details. Gastroenterology has been consulted at the request of Dr. Loletha Grayer to evaluate for pancreatitis.  HISTORY OF PRESENT ILLNESS: We appreciate this consult for this 29 year old African American man with history of gastritis and recurrent pancreatitis and for pancreatitis. He states he has been seen and released from the ED several times over the last few weeks with abdominal pain, nausea, and vomiting. He states that this increased yesterday after eating [place]. He represented today to the emergency department and was admitted with elevated white blood cells and lipase. He is feeling better today having received IV fluids, pain medication, and IV PPI. Furthermore, his lipase was normal today. He states that his abdominal pain has been in the epigastric area. He has vomited nonbloody material several times prior to admission, none today. He states that some greasy foods aggravate his abdominal discomfort, but not all. He completely denies EtOH and taking illicits and does not have history of gallstones. In chart review, on previous imaging studies, no calcifications were found in the pancreas and no other pseudocyst or masses have been found. He did have one single episode of watery diarrhea. He states it is totally resolved at present.   PAST MEDICAL HISTORY:  1. Acute pancreatitis. 2. Gastritis. 3. Remote history of marijuana use. 4. Tobacco use. 5. History of alcohol abuse. 6. Chronic abdominal pain.  MEDICATIONS:  None regular. Does include Phenergan and Percocet p.r.n.   ALLERGIES: No known drug allergies.   SOCIAL HISTORY: He lives by himself. smokes one  pack per day of cigarettes. Denies recent marijuana use, however, I did note upon chart review he had a positive cannabinoid result last autumn for this. Denies recent alcohol use.   FAMILY HISTORY: Significant for hypertension. No known GI issues in the family.  REVIEW OF SYSTEMS: CONSTITUTIONAL: No fevers. Weight stable. No complaints of fatigue. HEENT: No complaints of headaches, blurry vision, double vision, hearing loss, ear ringing, nasal discharge, or congestion. No difficulty swallowing. RESPIRATORY: Denies cough, shortness of breath, and wheezing. CARDIOVASCULAR: No chest pain, orthopnea, murmur, palpitations, or syncope. GASTROINTESTINAL: As noted above, otherwise negative review. GU: No dysuria or hematuria. ENDOCRINE: No history of diabetes or thyroid problems or heat temperature intolerances. HEMATOLOGIC: Denies history of anemia, bruising, or bleeding. MUSCULOSKELETAL: No history of arthritis, joint pain muscle pain, or gout. NEUROLOGIC: No history of CVA, numbness, tingling, dizziness, fainting, or seizures. PSYCH: No history of anxiety, depression, or insomnia.  LABS/STUDIES: Most recent lab work: Glucose 87, BUN 10, creatinine 1.11, serum sodium 141, potassium 3.7, chloride 104, GFR greater than 60, calcium 8.5, lipase 207, total protein 6.0, albumin 3.2, bilirubin 0.5, ALT 38, AST 22, and ALT 16. WBC 11.5, hemoglobin 11.8, hematocrit 35.6, platelets 135, MCV 95, MCH 31.2, and MCHC 33.0.   Urinalysis: No signs of urinary tract infection.   Urine drug screen is pending.   PHYSICAL EXAM:   VITAL SIGNS: Most recent vital signs: Temperature 98.4, pulse 63, respiratory rate 20, blood pressure 119/69, and PaO2 100% on room air.   GENERAL: Well-appearing active man ambulatory in the hall, in no apparent distress.   PSYCH: Logical thought, mood stable, cooperative.   HEENT: Normocephalic, atraumatic. No scleral  icterus. No redness, drainage, or inflammation to the eyes or the nares.   Mouth is with pink moist oral mucous membranes.   NECK: No JVD, adenopathy, or thyromegaly.   LUNGS: Respirations eupneic. Lungs CTAB.  ABDOMEN: Flat, minimal tenderness to the epigastrium, nondistended, otherwise nontender. Bowel sounds x4. No peritoneal signs, guarding, hepatosplenomegaly, hernias, or other issues.   RECTAL: Deferred.   GENITOURINARY: Deferred.   EXTREMITIES: Gait very steady. MAEW x4. Sensation intact. No clubbing, cyanosis, or edema.   SKIN: Warm, dry, and pink without erythema, lesion, or rash.   NEUROLOGIC: Alert night x3. Cranial nerves II through XII grossly intact. Speech clear. No facial droop.   IMPRESSION AND PLAN:  Acute episode of recurrent pancreatis, noted transient increase and decrease in lipase over the last three years. No evidence radiographically of pseudocyst, calcification, mass, or gallstone. Not on medications that could cause pancreatitis. Does have history of alcohol abuse. Would recommend IgG-4 and lipid panel to rule out autoimmune cause for hypertriglyceridemia. However, this is not likely.   These services were provided by Stephens November, MSN, NP-C in collaboration with Loistine Simas, MD. ____________________________ Theodore Demark, NP chl:slb D: 07/08/2011 08:54:12 ET     T: 07/08/2011 12:48:31 ET         JOB#: [id number] cc: Theodore Demark, NP, <Dictator> [place] SIGNED 07/09/2011 20:35

## 2014-07-27 LAB — SURGICAL PATHOLOGY

## 2014-08-02 NOTE — Discharge Summary (Signed)
PATIENT NAME:  Albert Peters, Albert Peters MR#:  [id number] DATE OF BIRTH:  [birth year]  DATE OF ADMISSION:  04/24/2014 DATE OF DISCHARGE:  04/30/2014  ADMITTING DIAGNOSIS: Abdominal pain.   DISCHARGE DIAGNOSES:  1.  Abdominal pain, felt to be due to a combination of acute pancreatitis, possible some abdominal migraines.  2.  Mild gastritis noted on EGD.  3.  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  4.  History of alcohol abuse.  5.  Tobacco abuse.  6.  History of narcotic seeking behavior with previous 5 Emergency Room visits within the past 6 months.  7.  Nicotine addiction.  8.  Elevated liver function tests, workup pending. He is to follow up with primary care provider for further results.  CONSULTANTS: Dr. Thurmond Butts.   PERTINENT EVALUATIONS AND LABORATORY DATA: Upper endoscopy showed mild gastritis, normal duodenum. Admitting lipase 700, most recent lipase on January 26 was 58. Glucose 70, BUN 8, creatinine 0.93, sodium 140, potassium 3.7, chloride 107, CO2 of 25, calcium 9.0. WBC 10.1, hemoglobin 13.3, platelet count was 174,000. CT of the abdomen and pelvis showed no acute findings. Small liver lesions appear to be benign.   HOSPITAL COURSE: Please refer to H and P done by the admitting physician. The patient is a 29 year old African American male with previous history of alcohol abuse in the past with history of pancreatitis presented with abdominal pain. The patient was noted to have a lipase that was mildly elevated. He was admitted and symptomatically treated for acute pancreatitis. The patient continued to complain of significant pain. He was seen by GI and underwent endoscopy. His endoscopy showed focal gastritis. No other significant abnormality was noted. The patient was thought to have possible abdominal migraines, per Dr. Thurmond Butts. He recommended him to not narcotics; however, the patient continued to want narcotics. There is some concern for narcotic-seeking behavior. The patient's  symptoms are improved and he is stable for discharge.   DISCHARGE MEDICATIONS: Acetaminophen/hydrocodone 325/5 q. 6 p.r.n. for pain, pancrelipase 3 caps t.i.d. with meals, dicyclomine 20 mg 1 tab p.o. q. 8, sucralfate 1 gram 10 mL q. 6 hours, Protonix 40 mg 1 tablet p.o. b.i.d.   DIET: Regular.   ACTIVITY: As tolerated.   FOLLOWUP: Primary M.D. in 1-2 weeks.   TIME SPENT: 35 minutes on this patient.   ____________________________ Lafonda Mosses. Posey Pronto, MD shp:bm D: 04/30/2014 20:47:44 ET T: 05/01/2014 04:45:36 ET JOB#: [id number]  cc: Merve Hotard H. Posey Pronto, MD, <Dictator> Alric Seton MD ELECTRONICALLY SIGNED 05/04/2014 8:55

## 2014-08-02 NOTE — H&P (Signed)
PATIENT NAME:  JOHNDAVID, GERALDS MR#:  [id number] DATE OF BIRTH:  11-18-1985  DATE OF ADMISSION:  04/24/2014  PRESENTING COMPLAINT: Abdominal pain for 2 days.   HISTORY OF PRESENT ILLNESS: Mr. Gonzaga is a 29 year old African American gentleman with history of pancreatitis secondary to alcohol abuse in the past, along with history of gastritis, comes to the Emergency Room with ongoing abdominal pain and several episodes of vomiting at home and a couple of episodes of vomiting here in the Emergency Room. The patient has known history of pancreatitis. Denies any history of alcohol abuse in the last 2 years. Came to the Emergency Room January 21st. He was found to have normal lipase and did complain of some abdominal pain. Was treated in the Emergency Room, discharged to home. From there, he went to [facility], and the patient says his lipase was elevated. He received some IV pain medication and since he started feeling better, he was discharged to home. He came back again today, here, since his abdominal pain continued, along with vomiting. His lipase today was 700. He received 2 rounds of Dilaudid. He has been requesting more pain meds. The patient does have a history of narcotic seeking behavior, although he declines that. He is being admitted for further evaluation and management.   PAST MEDICAL HISTORY: 1. History of alcohol-induced pancreatitis.  2. Alcohol history of alcohol abuse. According to the patient, he has not drank in the last 2 years.  3. Tobacco abuse.  4. History of narcotic-seeking behavior. According to his chart, in the remote past.  ALLERGIES: PENICILLIN, SULFA AND SHELLFISH.   SOCIAL HISTORY: He works at [employer] in [place]. Smokes about 1/2 pack a day. He smokes marijuana once in a while. Denies any cocaine or any alcohol abuse.   FAMILY HISTORY: Positive for hypertension.   REVIEW OF SYSTEMS:  CONSTITUTIONAL: No fever, fatigue, or change in weight.  EYES: No  blurred or double vision, cataracts, or glaucoma.  EARS, NOSE, AND THROAT: No tinnitus, ear pain, hearing loss, or postnasal drip.  RESPIRATORY: No cough, wheeze, hemoptysis, or dyspnea.  CARDIOVASCULAR: No chest pain, orthopnea, edema, or syncope.  GASTROINTESTINAL: No nausea, vomiting, diarrhea. Positive for nausea, vomiting, and abdominal pain. No diarrhea, or GERD, or hematemesis.  GENITOURINARY: No dysuria, hematuria, or frequency.  ENDOCRINE: No polyuria, nocturia, or thyroid problems.  HEMATOLOGY: No anemia or easy bruising or bleeding.  SKIN: No acne, rash, or lesion.  MUSCULOSKELETAL: No pain, arthritis, swelling, or gout.  NEUROLOGIC: No CVA, TIA, ataxia.  PSYCHIATRIC: No anxiety, depression, or bipolar disorder. All other systems reviewed and negative.   PHYSICAL EXAMINATION: GENERAL: The patient is awake, alert, oriented x 3, is in mild to moderate distress due to his abdominal pain.  VITAL SIGNS: He is afebrile. Pulse is 85. Blood pressure is 105/51, saturations 100% on room air.  HEENT: Atraumatic, normocephalic. Pupils PERRLA, EOM intact, oral mucosa is moist.  NECK: Supple. No JVD. No carotid bruit.  LUNGS: Clear to auscultation bilaterally. No rales, rhonchi, respiratory distress, or labored breathing.  CARDIOVASCULAR: Both the heart sounds are normal. Rate, rhythm regular. No murmur heard. PMI not lateralized.  CHEST: Nontender.  EXTREMITIES: Good pedal pulses, good femoral pulses. No lower extremity edema.  ABDOMEN: Soft. There is some tenderness present in the epigastric area. Marland Kitchen  NEUROLOGIC: Grossly intact cranial nerves II through XII. No motor or sensory deficits.  PSYCHIATRIC: The patient is awake, alert, oriented x 3.  SKIN: Warm and dry.  LABORATORY DATA: Lipase is 700. CBC within normal limits. SGOT is 55 and SGPT is 142. The rest of the chemistry is within normal limits.   ASSESSMENT: A 29 year old Kenlee Maler with history of pancreatitis, alcohol abuse in  the remote past, comes in with:  1. Acute mild pancreatitis. Etiology unclear. The patient states he has not drank alcohol for 2 years. We will admit the patient to the medical floor, give him ice chips and sips of water. Continue IV hydration. Monitor lipase. Continue IV Dilaudid. Monitor medication. Monitor narcotic-seeking. The patient has a history of narcotic-seeking in the past. We will also give him some p.r.n. Norco, since transition to p.o. meds would be easier then. Consider GI consultation if needed.  2. Nausea, vomiting, p.r.n. Zofran and continue IV fluids.  3. Deep vein thrombosis prophylaxis. Subcutaneous heparin t.i.d.  4. Tobacco abuse. Smoking cessation discussed, about 4 minutes spent. The patient does not appear to be motivated.   TIME SPENT: 40 minutes      ____________________________ Gus Height A. Posey Pronto, MD sap:mw D: 04/24/2014 17:37:47 ET T: 04/24/2014 19:02:50 ET JOB#: [id number]  cc: Tamesha Ellerbrock A. Posey Pronto, MD, <Dictator> Ilda Basset MD ELECTRONICALLY SIGNED 04/28/2014 14:21

## 2014-08-02 NOTE — Consult Note (Signed)
Details:   - GI Note:  EGD:  small area of focal gastritis.  Biopsied. Suspect this is retch trauma.   I had a discussion that this is abdominal migraine pain.  Also discussed that narcotics worsen abd pain over the long run and to such a degrree that [facility] brings patients wtih chroinc abd pain who are on narcotics in for detox.  He acknowledged understanding but then went back to asking for IV dilaudid since pain pills aren't working.   Would continue to treat with BID PPI, carafate, bentyl.   Electronic Signatures: Arther Dames (MD)  (Signed 27-Jan-16 14:04)  Authored: Details   Last Updated: 27-Jan-16 14:04 by Arther Dames (MD)

## 2014-08-02 NOTE — Consult Note (Signed)
PATIENT NAME:  Albert Peters, Albert Peters MR#:  [id number] DATE OF BIRTH:  11-Nov-1985  DATE OF CONSULTATION:  04/28/2014  REFERRING PHYSICIAN:  Tana Conch. Leslye Peer, MD  CONSULTING PHYSICIAN:  Shirlean Mylar, MD  REASON FOR CONSULTATION: Abdominal pain, elevated lipase.   HISTORY OF PRESENT ILLNESS: Albert Peters is a 29 year old male with a past medical history notable for alcohol-induced pancreatitis, who present to the Emergency Room on January 22nd for evaluation of abdominal pain. Prior to this, he was also seen in our Emergency Room and then the [facility] Emergency Room. He was noted to have a very mildly elevated lipase. In the setting of this, along with abdominal pain and some nausea, he was admitted to the hospital.   Here, in the hospital, he has continued to have issues with abdominal pain and has been has been requiring IV pain medications.   Albert Peters tells me that he has been struggling with abdominal pain for proximally 2 years. He has not been able to get in with a PCP because he does not have insurance, and reports that the cash cost is prohibitively expensive for him to get into a PCP. He does go to different Emergency Rooms trying to seek care for this pain.  I do not see that he has had any cross-sectional imaging of his abdomen. He did have an abdominal ultrasound in this presentation that does show stable hepatic cyst, but otherwise, no explanation for the pancreatitis. He is going for a CT scan of his abdomen today.   PAST MEDICAL HISTORY: 1. Alcohol-induced pancreatitis.  2. Alcohol abuse.  3. Tobacco abuse.  4. History of narcotic seeking.   ALLERGIES: PENICILLIN, SULFA, AND SHELLFISH.   SOCIAL HISTORY: An ongoing smoker. Previous drinker, denies any recently.   FAMILY HISTORY: No family history of pancreatitis. No history of GI malignancy.   REVIEW OF SYSTEMS: A 10-system review was conducted and is negative, except as stated in the HPI.   PHYSICAL EXAMINATION: VITAL SIGNS:  Temperature is 98.3, pulse is 57, respirations are 20, blood pressure 130/82, pulse oxygen 98% on room air.  GENERAL: Alert and oriented times 4.  No acute distress. Appears stated age. HEENT: Normocephalic/atraumatic. Extraocular movements are intact. Anicteric. NECK: Soft, supple. JVP appears normal. No adenopathy. CHEST: Clear to auscultation. No wheeze or crackle. Respirations unlabored. HEART: Regular. No murmur, rub, or gallop.  Normal S1 and S2. ABDOMEN: Tenderness to palpation, worse in the epigastric region, but somewhat diffuse normoactive bowel sounds, soft, no rebound or guarding.  EXTREMITIES: No swelling, well perfused. SKIN: No rash or lesion. Skin color, texture, turgor normal. NEUROLOGICAL: Grossly intact. PSYCHIATRIC: Normal tone and affect. MUSCULOSKELETAL: No joint swelling or erythema.   LABORATORY STUDIES: His basic metabolic panel has been normal. Lipase was initially 359, then 700, which is twice the upper limit of normal, and then the following day, it was down to 6061, which is within normal limits. His liver enzymes have been notable for a transaminitis with an ALT of 142 and an AST of 55 on the 22nd. On the 26th, the ALT is 179, the AST is 83, and albumin 3.3, total bilirubin normal, alkaline phosphatase is normal. CBC: White count was 8.6 on presentation, normal platelets, normal hemoglobin. He had a UA which was normal. He did not have a urine drug screen. His triglycerides are also normal.   IMAGING STUDIES: Ultrasound of the abdomen shows a stable hepatic hemangioma.   ASSESSMENT AND PLAN:  PROBLEM #1. Acute on chronic abdominal pain:  His lipase was very mildly elevated at approximately 2 times upper limit of normal and then quickly normalized. I suspect that this was not an actual episode of acute pancreatitis and was just a mild elevation in lipase. Nonetheless, given the ongoing abdominal pain, which is worse today, I would like to see the results of the CT  abdomen and pelvis to look for evidence of ongoing pancreatitis or potentially look for any other cause of abdominal pain. I do suspect this is most likely functional abdominal pain, given the length of time it that has been present, the lack of change around eating or stooling, and the lack of alarm symptoms.   RECOMMENDATIONS: 1. Await findings on CT abdomen and pelvis.  2. Try to minimize narcotics, given this is likely functional abdominal pain with worse outcomes for patients that are on chronic narcotics.  3. I will start a PPI, also Carafate and Bentyl to see if we can get some symptomatic improvement with these medications.   PROBLEM #2: Transaminitis. This is a persistent transaminitis since at least June 2014. I do not see evidence of a serologic work-up and will conduct this serologic work-up in the a.m. to evaluate for possible causes of persistent transaminitis.   Thank you for this consult.    ____________________________ Shirlean Mylar, MD sj:mw D: 04/28/2014 13:48:32 ET T: 04/28/2014 14:01:48 ET JOB#: [id number]  cc: Shirlean Mylar, MD, <Dictator>

## 2014-08-02 NOTE — Consult Note (Signed)
PATIENT NAME:  Albert Peters, Albert Peters MR#:  [id number] DATE OF BIRTH:  1985/05/12  DATE OF CONSULTATION:  04/28/2014  REFERRING PHYSICIAN:  Tana Conch. Leslye Peer, MD  CONSULTING PHYSICIAN:  Arther Dames, MD  REASON FOR CONSULTATION: Abdominal pain, elevated lipase.   HISTORY OF PRESENT ILLNESS: Albert Peters is a 29 year old male with a past medical history notable for alcohol-induced pancreatitis, who present to the Emergency Room on January 22nd for evaluation of abdominal pain. Prior to this, he was also seen in our Emergency Room and then the [facility] Emergency Room. He was noted to have a very mildly elevated lipase. In the setting of this, along with abdominal pain and some nausea, he was admitted to the hospital.   Here, in the hospital, he has continued to have issues with abdominal pain and has been has been requiring IV pain medications.   Albert Peters tells me that he has been struggling with abdominal pain for proximally 2 years. He has not been able to get in with a PCP because he does not have insurance, and reports that the cash cost is prohibitively expensive for him to get into a PCP. He does go to different Emergency Rooms trying to seek care for this pain.  I do not see that he has had any cross-sectional imaging of his abdomen. He did have an abdominal ultrasound in this presentation that does show stable hepatic cyst, but otherwise, no explanation for the pancreatitis. He is going for a CT scan of his abdomen today.   PAST MEDICAL HISTORY: 1. Alcohol-induced pancreatitis.  2. Alcohol abuse.  3. Tobacco abuse.  4. History of narcotic seeking.   ALLERGIES: PENICILLIN, SULFA, AND SHELLFISH.   SOCIAL HISTORY: An ongoing smoker. Previous drinker, denies any recently.   FAMILY HISTORY: No family history of pancreatitis. No history of GI malignancy.   REVIEW OF SYSTEMS: A 10-system review was conducted and is negative, except as stated in the HPI.   PHYSICAL EXAMINATION: VITAL SIGNS:  Temperature is 98.3, pulse is 57, respirations are 20, blood pressure 130/82, pulse oxygen 98% on room air.  GENERAL: Alert and oriented times 4.  No acute distress. Appears stated age. HEENT: Normocephalic/atraumatic. Extraocular movements are intact. Anicteric. NECK: Soft, supple. JVP appears normal. No adenopathy. CHEST: Clear to auscultation. No wheeze or crackle. Respirations unlabored. HEART: Regular. No murmur, rub, or gallop.  Normal S1 and S2. ABDOMEN: Tenderness to palpation, worse in the epigastric region, but somewhat diffuse normoactive bowel sounds, soft, no rebound or guarding.  EXTREMITIES: No swelling, well perfused. SKIN: No rash or lesion. Skin color, texture, turgor normal. NEUROLOGICAL: Grossly intact. PSYCHIATRIC: Normal tone and affect. MUSCULOSKELETAL: No joint swelling or erythema.   LABORATORY STUDIES: His basic metabolic panel has been normal. Lipase was initially 359, then 700, which is twice the upper limit of normal, and then the following day, it was down to 6061, which is within normal limits. His liver enzymes have been notable for a transaminitis with an ALT of 142 and an AST of 55 on the 22nd. On the 26th, the ALT is 179, the AST is 83, and albumin 3.3, total bilirubin normal, alkaline phosphatase is normal. CBC: White count was 8.6 on presentation, normal platelets, normal hemoglobin. He had a UA which was normal. He did not have a urine drug screen. His triglycerides are also normal.   IMAGING STUDIES: Ultrasound of the abdomen shows a stable hepatic hemangioma.   ASSESSMENT AND PLAN:  PROBLEM #1. Acute on chronic abdominal pain:  His lipase was very mildly elevated at approximately 2 times upper limit of normal and then quickly normalized. I suspect that this was not an actual episode of acute pancreatitis and was just a mild elevation in lipase. Nonetheless, given the ongoing abdominal pain, which is worse today, I would like to see the results of the CT  abdomen and pelvis to look for evidence of ongoing pancreatitis or potentially look for any other cause of abdominal pain. I do suspect this is most likely functional abdominal pain, given the length of time it that has been present, the lack of change around eating or stooling, and the lack of alarm symptoms.   RECOMMENDATIONS: 1. Await findings on CT abdomen and pelvis.  2. Try to minimize narcotics, given this is likely functional abdominal pain with worse outcomes for patients that are on chronic narcotics.  3. I will start a PPI, also Carafate and Bentyl to see if we can get some symptomatic improvement with these medications.   PROBLEM #2: Transaminitis. This is a persistent transaminitis since at least June 2014. I do not see evidence of a serologic work-up and will conduct this serologic work-up in the a.m. to evaluate for possible causes of persistent transaminitis.   Thank you for this consult.   ____________________________ Arther Dames, MD mr:mw D: 04/28/2014 13:48:00 ET T: 04/28/2014 14:01:48 ET JOB#: [id number]  cc: Arther Dames, MD, <Dictator> Mellody Life MD ELECTRONICALLY SIGNED 05/18/2014 15:12

## 2014-08-13 ENCOUNTER — Encounter: Payer: Self-pay | Admitting: Emergency Medicine

## 2014-08-13 ENCOUNTER — Emergency Department
Admission: EM | Admit: 2014-08-13 | Discharge: 2014-08-13 | Disposition: A | Discharge status: Home / Self Care | Payer: Self-pay | Attending: Emergency Medicine | Admitting: Emergency Medicine

## 2014-08-13 DIAGNOSIS — R1084 Generalized abdominal pain: Secondary | ICD-10-CM | POA: Insufficient documentation

## 2014-08-13 DIAGNOSIS — Z720 Tobacco use: Secondary | ICD-10-CM | POA: Insufficient documentation

## 2014-08-13 DIAGNOSIS — R1115 Cyclical vomiting syndrome unrelated to migraine: Secondary | ICD-10-CM

## 2014-08-13 DIAGNOSIS — G43A1 Cyclical vomiting, intractable: Secondary | ICD-10-CM | POA: Insufficient documentation

## 2014-08-13 DIAGNOSIS — Z79899 Other long term (current) drug therapy: Secondary | ICD-10-CM | POA: Insufficient documentation

## 2014-08-13 MED ORDER — HALOPERIDOL LACTATE 5 MG/ML IJ SOLN
INTRAMUSCULAR | Status: AC
  Administered 2014-08-13: 10 mg via INTRAMUSCULAR
  Filled 2014-08-13: qty 1

## 2014-08-13 MED ORDER — HALOPERIDOL LACTATE 5 MG/ML IJ SOLN
5.0000 mg | Freq: Once | INTRAMUSCULAR | Status: AC
  Administered 2014-08-13: 10 mg via INTRAMUSCULAR

## 2014-08-13 MED ORDER — PROCHLORPERAZINE EDISYLATE 5 MG/ML IJ SOLN: 10.0000 mg | Freq: Four times a day (QID) | INTRAMUSCULAR | Status: DC | PRN

## 2014-08-13 MED ORDER — DIAZEPAM 5 MG PO TABS
ORAL_TABLET | ORAL | Status: AC
  Administered 2014-08-13: 5 mg via ORAL
  Filled 2014-08-13: qty 1

## 2014-08-13 MED ORDER — ONDANSETRON 8 MG PO TBDP
ORAL_TABLET | ORAL | Status: AC
Start: 2014-08-13 — End: 2014-08-13
  Administered 2014-08-13: 8 mg
  Filled 2014-08-13: qty 1

## 2014-08-13 MED ORDER — PROCHLORPERAZINE 25 MG RE SUPP: 25.0000 mg | Freq: Two times a day (BID) | RECTAL | Status: DC | PRN

## 2014-08-13 MED ORDER — DIAZEPAM 5 MG PO TABS
5.0000 mg | ORAL_TABLET | Freq: Once | ORAL | Status: AC
  Administered 2014-08-13: 5 mg via ORAL

## 2014-08-13 MED ORDER — PROMETHAZINE HCL 25 MG/ML IJ SOLN
INTRAMUSCULAR | Status: AC
  Administered 2014-08-13: 25 mg via INTRAMUSCULAR
  Filled 2014-08-13: qty 1

## 2014-08-13 MED ORDER — PROMETHAZINE HCL 25 MG/ML IJ SOLN
25.0000 mg | Freq: Once | INTRAMUSCULAR | Status: AC
  Administered 2014-08-13: 25 mg via INTRAMUSCULAR

## 2014-08-13 MED ORDER — PROCHLORPERAZINE MALEATE 10 MG PO TABS: 10.0000 mg | ORAL_TABLET | Freq: Four times a day (QID) | ORAL | Status: DC | PRN

## 2014-08-13 MED ORDER — ONDANSETRON HCL 4 MG PO TABS
8.0000 mg | ORAL_TABLET | Freq: Once | ORAL | Status: AC
  Administered 2014-08-13: 8 mg via ORAL

## 2014-08-13 MED ORDER — PROCHLORPERAZINE EDISYLATE 5 MG/ML IJ SOLN
INTRAMUSCULAR | Status: AC
  Administered 2014-08-13: 10 mg
  Filled 2014-08-13: qty 2

## 2014-08-13 NOTE — ED Notes (Signed)
Pt resting in bed with eyes closed, vitals stable Silver Parkey, Evelina Bucy, RN

## 2014-08-13 NOTE — ED Provider Notes (Signed)
[facility] Emergency Department Provider Note  ____________________________________________  Time seen: 750 am  I have reviewed the triage vital signs and the nursing notes.   HISTORY  Chief Complaint Abdominal Pain  nausea vomiting, recurrent    HPI Albert Peters is a 29 y.o. male who presents with abdominal pain and nausea. This patient has a long and complicated history of similar. He reports that he had been doing okay until a few days ago. He first came to [facility] to days ago but the wait was too long so he went [facility] After being treated there he was discharged. His symptoms returned last night and he went to [facility] in [place]. He was discharged but this morning woke with additional pain and the inability to tolerate by mouth. He now presents to [facility]. He reports ongoing abdominal pain. Primarily in the epigastric area.     Past Medical History  Diagnosis Date  . Pancreatitis   . Hepatitis C   . Gastritis     Patient Active Problem List   Diagnosis Date Noted  . Elevated LFTs 06/11/2012  . Abdominal pain 06/11/2012  . History of pancreatitis 06/11/2012  . Gastritis 06/11/2012  . Tobacco abuse 06/11/2012  . Cannabis abuse 06/11/2012    History reviewed. No pertinent past surgical history.  Current Outpatient Rx  Name  Route  Sig  Dispense  Refill  . dicyclomine (BENTYL) 20 MG tablet   Oral   Take 20 mg by mouth 2 (two) times daily.         . famotidine (PEPCID) 20 MG tablet   Oral   Take 20 mg by mouth 2 (two) times daily.         Marland Kitchen omeprazole (PRILOSEC) 20 MG capsule   Oral   Take 20 mg by mouth 2 (two) times daily.         . ondansetron (ZOFRAN) 4 MG tablet   Oral   Take 4 mg by mouth as needed. Nausea/vomiting         . Oxycodone HCl 10 MG TABS   Oral   Take 5-10 mg by mouth every 4 (four) hours as needed (nausea/vomiting).         . prochlorperazine (COMPAZINE) 10 MG tablet  Oral   Take 1 tablet (10 mg total) by mouth every 6 (six) hours as needed for nausea.   15 tablet   1   . prochlorperazine (COMPAZINE) 25 MG suppository   Rectal   Place 1 suppository (25 mg total) rectally every 12 (twelve) hours as needed for nausea.   12 suppository   1   . promethazine (PHENERGAN) 25 MG tablet   Oral   Take 25 mg by mouth every 6 (six) hours as needed. Nausea/ up to 20 doses         . promethazine (PHENERGAN) 25 MG tablet   Oral   Take 1 tablet (25 mg total) by mouth every 6 (six) hours as needed for nausea or vomiting.   20 tablet   0     Allergies Penicillin g; Shellfish allergy; and Sulfa antibiotics  Family History  Problem Relation Age of Onset  . CAD Father     Social History History  Substance Use Topics  . Smoking status: Heavy Tobacco Smoker -- 1.00 packs/day  . Smokeless tobacco: Not on file  . Alcohol Use: No    Review of Systems  Constitutional: Negative for fever. ENT: Negative for sore  throat. Cardiovascular: Negative for chest pain. Respiratory: Negative for shortness of breath. Gastrointestinal: Positive for abdominal pain and nausea. This is chronic in nature. See history of present illness Genitourinary: Negative for dysuria. Musculoskeletal: Negative for back pain. Skin: Negative for rash. Neurological: Negative for headaches   10-point ROS otherwise negative.  ____________________________________________   PHYSICAL EXAM:  VITAL SIGNS: ED Triage Vitals  Enc Vitals Group     BP 08/13/14 0735 123/84 mmHg     Pulse Rate 08/13/14 0735 88     Resp 08/13/14 0735 20     Temp --      Temp src --      SpO2 08/13/14 0735 100 %     Weight 08/13/14 0735 230 lb (104.327 kg)     Height 08/13/14 0735 6\' 2"  (1.88 m)     Head Cir --      Peak Flow --      Pain Score 08/13/14 0736 10     Pain Loc --      Pain Edu? --      Excl. in [place]? --     Constitutional: Alert and oriented. Well-nourished well-developed,  muscular. Patient is slightly loud and dynamic in his manners. He does not appear weak but does appear slightly uncomfortable. ENT   Head: Normocephalic and atraumatic.   Nose: No congestion/rhinnorhea.   Mouth/Throat: Mucous membranes are moist. Cardiovascular: Normal rate, regular rhythm. Respiratory: Normal respiratory effort without tachypnea. Breath sounds are clear and equal bilaterally. No wheezes/rales/rhonchi. Gastrointestinal: No distention. Abdomen is muscular but not rigid. He has tenderness in the epigastric area. Back: There is no CVA tenderness. Musculoskeletal: Nontender with normal range of motion in all extremities.  No noted edema. Neurologic:  Normal speech and language. No gross focal neurologic deficits are appreciated.  Skin:  Skin is warm, dry. No rash noted. Psychiatric: Alert and communicative. The patient had a fairly good and lengthy conversation with me about his complicated situation and the perception of other people. Has moderate insight. It is hard to gauge there is a degree of manipulative behavior.  ____________________________________________    LABS (pertinent positives/negatives)  No labs indicated this morning. His labs from [facility] last night were reviewed.  ____________________________________________   ____________________________________________   PROCEDURES  Procedure(s) performed: None  Critical Care performed: No  ____________________________________________   INITIAL IMPRESSION / ASSESSMENT AND PLAN / ED COURSE   Albert Peters is well-known to me and to our emergency department. I have always had a good rapport with the patient but there are concerns for his emergency department visit pattern and suspicion of drug-seeking behavior. The patient can be alternatively pleasant and easy to work with and then loud and demanding. There is a care plan that includes avoiding narcotics for this patient.  I have sat down and had  a very forthright conversation with the patient about what we can do today and what the expectations are. We have reviewed the perception of other healthcare providers and the pattern set generate this perception. We have discussed treatment options and have agreed to treat the patient with IM medications only currently. Ideally, I would have droperidol available to help this patient with his cyclic vomiting syndrome and chronic abdominal pain symptoms, but since this is not available I discussed using haloperidol with the patient. He agrees with this plan. We will give him an injection of 5 mg of haloperidol IM along with Phenergan 25 mg IM and Zofran 8 mg oral dissolving tablet.  I reviewed his labs from [facility] and from [facility]. Of note his lipase levels last night were normal. I do not see any indication for repeat blood tests. The patient's pulse rate is normal. His blood pressure is normal. There are no signs of dehydration. I do not see a need for IV resuscitation. I do not see the need currently for the use of narcotics.   ----------------------------------------- 12:07 PM on 08/13/2014 -----------------------------------------  The patient had ongoing nausea after the initial treatment of Phenergan and haloperidol. We treated him with Compazine 10 mg IM. The patient started feeling better. We did add 5 Miller grams of Valium by mouth. The patient is tolerating by mouth and feels more comfortable. We will discharge him home. We have had further long discussion with him about follow-up for his ongoing chronic problem. Overall the interaction today was very pleasant and respectful. I am prescribing Compazine to be taken either by mouth or PR, 1 prescription for each Route.  ____________________________________________   FINAL CLINICAL IMPRESSION(S) / ED DIAGNOSES  Final diagnoses:  Generalized abdominal pain  Intractable cyclical vomiting with nausea      Ahmed Prima, MD 08/13/14  1209

## 2014-08-13 NOTE — ED Notes (Signed)
Pt informed to return if any life threatening symptoms occur.  

## 2014-08-13 NOTE — ED Notes (Signed)
Pt has chronic abdominal pain with nausea/vomiting from pancreatitis, pt reports vomiting and pain started today

## 2014-08-13 NOTE — Discharge Instructions (Signed)
Continues the medicines that you have at home. We are prescribing Compazine as. Please work on follow-up with her regular physician. This can be through [employer] [facility]. You could also [facility] since she doesn't have insurance at this time. Return to the emergency department if you are unable to tolerate any food and have ongoing nausea and vomiting.  Cyclic Vomiting Syndrome Cyclic vomiting syndrome is a benign condition in which patients experience bouts or cycles of severe nausea and vomiting that last for hours or even days. The bouts of nausea and vomiting alternate with longer periods of no symptoms and generally good health. Cyclic vomiting syndrome occurs mostly in children, but can affect adults. CAUSES  [facility] has no known cause. Each episode is typically similar to the previous ones. The episodes tend to:   Start at about the same time of day.  Last the same length of time.  Present the same symptoms at the same level of intensity. Cyclic vomiting syndrome can begin at any age in children and adults. Cyclic vomiting syndrome usually starts between the ages of 3 and 7 years. In adults, episodes tend to occur less often than they do in children, but they last longer. Furthermore, the events or situations that trigger episodes in adults cannot always be pinpointed as easily as they can in children. There are 4 phases of cyclic vomiting syndrome: 1. Prodrome. The prodrome phase signals that an episode of nausea and vomiting is about to begin. This phase can last from just a few minutes to several hours. This phase is often marked by belly (abdominal) pain. Sometimes taking medicine early in the prodrome phase can stop an episode in progress. However, sometimes there is no warning. A person may simply wake up in the middle of the night or early morning and begin vomiting. 2. Episode. The episode phase consists of:  Severe vomiting.  Nausea.  Gagging  (retching). 3. Recovery. The recovery phase begins when the nausea and vomiting stop. Healthy color, appetite, and energy return. 4. Symptom-free interval. The symptom-free interval phase is the period between episodes when no symptoms are present. TRIGGERS Episodes can be triggered by an infection or event. Examples of triggers include:  Infections.  Colds, allergies, sinus problems, and the flu.  Eating certain foods such as chocolate or cheese.  Foods with monosodium glutamate (MSG) or preservatives.  Fast foods.  Pre-packaged foods.  Foods with low nutritional value (junk foods).  Overeating.  Eating just before going to bed.  Hot weather.  Dehydration.  Not enough sleep or poor sleep quality.  Physical exhaustion.  Menstruation.  Motion sickness.  Emotional stress (school or home difficulties).  Excitement or stress. SYMPTOMS  The main symptoms of cyclic vomiting syndrome are:  Severe vomiting.  Nausea.  Gagging (retching). Episodes usually begin at night or the first thing in the morning. Episodes may include vomiting or retching up to 5 or 6 times an hour during the worst of the episode. Episodes usually last anywhere from 1 to 4 days. Episodes can last for up to 10 days. Other symptoms include:  Paleness.  Exhaustion.  Listlessness.  Abdominal pain.  Loose stools or diarrhea. Sometimes the nausea and vomiting are so severe that a person appears to be almost unconscious. Sensitivity to light, headache, fever, dizziness, may also accompany an episode. In addition, the vomiting may cause drooling and excessive thirst. Drinking water usually leads to more vomiting, though the water can dilute the acid in the vomit,  making the episode a little less painful. Continuous vomiting can lead to dehydration, which means that the body has lost excessive water and salts. DIAGNOSIS  Cyclic vomiting syndrome is hard to diagnose because there are no clear tests to  identify it. A caregiver must diagnose cyclic vomiting syndrome by looking at symptoms and medical history. A caregiver must exclude more common diseases or disorders that can also cause nausea and vomiting. Also, diagnosis takes time because caregivers need to identify a pattern or cycle to the vomiting. TREATMENT  Cyclic vomiting syndrome cannot be cured. Treatment varies, but people with cyclic vomiting syndrome should get plenty of rest and sleep and take medications that prevent, stop, or lessen the vomiting episodes and other symptoms. People whose episodes are frequent and long-lasting may be treated during the symptom-free intervals in an effort to prevent or ease future episodes. The symptom-free phase is a good time to eliminate anything known to trigger an episode. For example, if episodes are brought on by stress or excitement, this period is the time to find ways to reduce stress and stay calm. If sinus problems or allergies cause episodes, those conditions should be treated. The triggers listed above should be avoided or prevented. Because of the similarities between migraine and cyclic vomiting syndrome, caregivers treat some people with severe cyclic vomiting syndrome with drugs that are also used for migraine headaches. The drugs are designed to:  Prevent episodes.  Reduce their frequency.  Lessen their severity. HOME CARE INSTRUCTIONS Once a vomiting episode begins, treatment is supportive. It helps to stay in bed and sleep in a dark, quiet room. Severe nausea and vomiting may require hospitalization and intravenous (IV) fluids to prevent dehydration. Relaxing medications (sedatives) may help if the nausea continues. Sometimes, during the prodrome phase, it is possible to stop an episode from happening altogether. Only take over-the-counter or prescription medicines for pain, discomfort or fever as directed by your caregiver. Do not give aspirin to children. During the recovery phase,  drinking water and replacing lost electrolytes (salts in the blood) are very important. Electrolytes are salts that the body needs to function well and stay healthy. Symptoms during the recovery phase can vary. Some people find that their appetites return to normal immediately, while others need to begin by drinking clear liquids and then move slowly to solid food. RELATED COMPLICATIONS The severe vomiting that defines cyclic vomiting syndrome is a risk factor for several complications:  Dehydration--Vomiting causes the body to lose water quickly.  Electrolyte imbalance--Vomiting also causes the body to lose the important salts it needs to keep working properly.  Peptic esophagitis--The tube that connects the mouth to the stomach (esophagus) becomes injured from the stomach acid that comes up with the vomit.  Hematemesis--The esophagus becomes irritated and bleeds, so blood mixes with the vomit.  Mallory-Weiss tear--The lower end of the esophagus may tear open or the stomach may bruise from vomiting or retching.  Tooth decay--The acid in the vomit can hurt the teeth by corroding the tooth enamel. SEEK MEDICAL CARE IF: You have questions or problems. Document Released: 05/29/2001 Document Revised: 06/12/2011 Document Reviewed: 06/27/2010 [facility] Patient Information 2015 [place], [place]. This information is not intended to replace advice given to you by your health care provider. Make sure you discuss any questions you have with your health care provider.

## 2014-08-13 NOTE — ED Notes (Signed)
Pt presents with abd pain started yesterday. Was seen at [facility] last night and was given meds for nausea, vomiting and pain. Pt returns here this am with same symptoms. Pt with hx of pancreatitis.

## 2014-08-16 ENCOUNTER — Emergency Department
Admission: EM | Admit: 2014-08-16 | Discharge: 2014-08-16 | Disposition: A | Discharge status: Home / Self Care | Payer: Self-pay | Attending: Emergency Medicine | Admitting: Emergency Medicine

## 2014-08-16 DIAGNOSIS — R112 Nausea with vomiting, unspecified: Secondary | ICD-10-CM | POA: Insufficient documentation

## 2014-08-16 DIAGNOSIS — R1013 Epigastric pain: Secondary | ICD-10-CM | POA: Insufficient documentation

## 2014-08-16 DIAGNOSIS — Z79899 Other long term (current) drug therapy: Secondary | ICD-10-CM | POA: Insufficient documentation

## 2014-08-16 DIAGNOSIS — K2970 Gastritis, unspecified, without bleeding: Secondary | ICD-10-CM | POA: Insufficient documentation

## 2014-08-16 DIAGNOSIS — Z720 Tobacco use: Secondary | ICD-10-CM | POA: Insufficient documentation

## 2014-08-16 DIAGNOSIS — Z880 Allergy status to penicillin: Secondary | ICD-10-CM | POA: Insufficient documentation

## 2014-08-16 LAB — LIPASE, BLOOD: Lipase: 30 U/L (ref 22–51)

## 2014-08-16 MED ORDER — PROMETHAZINE HCL 25 MG/ML IJ SOLN
INTRAMUSCULAR | Status: AC
Start: 2014-08-16 — End: 2014-08-16
  Administered 2014-08-16: 25 mg via INTRAMUSCULAR
  Filled 2014-08-16: qty 1

## 2014-08-16 MED ORDER — PROCHLORPERAZINE EDISYLATE 5 MG/ML IJ SOLN
10.0000 mg | Freq: Four times a day (QID) | INTRAMUSCULAR | Status: DC | PRN
  Administered 2014-08-16: 10 mg via INTRAMUSCULAR
  Filled 2014-08-16: qty 2

## 2014-08-16 MED ORDER — PROMETHAZINE HCL 25 MG/ML IJ SOLN
25.0000 mg | Freq: Once | INTRAMUSCULAR | Status: AC
  Administered 2014-08-16: 25 mg via INTRAMUSCULAR

## 2014-08-16 MED ORDER — KETOROLAC TROMETHAMINE 60 MG/2ML IM SOLN
INTRAMUSCULAR | Status: AC
  Administered 2014-08-16: 60 mg via INTRAMUSCULAR
  Filled 2014-08-16: qty 2

## 2014-08-16 MED ORDER — PROCHLORPERAZINE EDISYLATE 5 MG/ML IJ SOLN
INTRAMUSCULAR | Status: AC
  Administered 2014-08-16: 10 mg via INTRAMUSCULAR
  Filled 2014-08-16: qty 2

## 2014-08-16 MED ORDER — KETOROLAC TROMETHAMINE 60 MG/2ML IM SOLN
60.0000 mg | Freq: Once | INTRAMUSCULAR | Status: AC
  Administered 2014-08-16: 60 mg via INTRAMUSCULAR

## 2014-08-16 NOTE — Progress Notes (Signed)
RN request for CSW to share information with patient due to frequent ED visits.  CSW in to meet with patient.  Patient states he is in pain and unable to get the pain medication he needs.  Admits to frequent ED visits.  Patient states he understands that he needs a PCP but cannot afford to go to the doctor.  States he is not currently taking any medications.  Patient lives with his girlfriend and is unemployed at this time.    CSW shared information with patient on [facility].  Patient is interested and open to receiving information.  CSW review application packet with patient.  Patient was appreciative of receiving the information and states he will follow up.   Casimer Lanius. Latanya Presser, [place] Work Department Emergency Room [phone] 2:11 PM

## 2014-08-16 NOTE — ED Notes (Signed)
C/o n,v, and abd. Pain since this am when he woke up

## 2014-08-16 NOTE — ED Provider Notes (Signed)
[facility] Emergency Department Provider Note  ____________________________________________  Time seen: 1230  I have reviewed the triage vital signs and the nursing notes.   HISTORY  Chief Complaint Abdominal Pain; Nausea; and Emesis   History limited by: Not Limited   HPI Albert Peters is a 29 y.o. male with history of pancreatitis who presents to the emergency department with abdominal pain, nausea and vomiting. Patient was recently seen in emergency departments 3 days ago for similar symptoms. Patient is concerned that his lipase is elevated. Patient states this pain feels like when he has bad pancreatitis. Patient denied any fevers.    Past Medical History  Diagnosis Date  . Pancreatitis   . Hepatitis C   . Gastritis     Patient Active Problem List   Diagnosis Date Noted  . Elevated LFTs 06/11/2012  . Abdominal pain 06/11/2012  . History of pancreatitis 06/11/2012  . Gastritis 06/11/2012  . Tobacco abuse 06/11/2012  . Cannabis abuse 06/11/2012    No past surgical history on file.  Current Outpatient Rx  Name  Route  Sig  Dispense  Refill  . dicyclomine (BENTYL) 20 MG tablet   Oral   Take 20 mg by mouth 2 (two) times daily.         . famotidine (PEPCID) 20 MG tablet   Oral   Take 20 mg by mouth 2 (two) times daily.         Marland Kitchen omeprazole (PRILOSEC) 20 MG capsule   Oral   Take 20 mg by mouth 2 (two) times daily.         . ondansetron (ZOFRAN) 4 MG tablet   Oral   Take 4 mg by mouth as needed. Nausea/vomiting         . Oxycodone HCl 10 MG TABS   Oral   Take 5-10 mg by mouth every 4 (four) hours as needed (nausea/vomiting).         . prochlorperazine (COMPAZINE) 10 MG tablet   Oral   Take 1 tablet (10 mg total) by mouth every 6 (six) hours as needed for nausea.   15 tablet   1   . prochlorperazine (COMPAZINE) 25 MG suppository   Rectal   Place 1 suppository (25 mg total) rectally every 12 (twelve) hours as  needed for nausea.   12 suppository   1   . promethazine (PHENERGAN) 25 MG tablet   Oral   Take 25 mg by mouth every 6 (six) hours as needed. Nausea/ up to 20 doses         . promethazine (PHENERGAN) 25 MG tablet   Oral   Take 1 tablet (25 mg total) by mouth every 6 (six) hours as needed for nausea or vomiting.   20 tablet   0     Allergies Penicillin g; Shellfish allergy; and Sulfa antibiotics  Family History  Problem Relation Age of Onset  . CAD Father     Social History History  Substance Use Topics  . Smoking status: Heavy Tobacco Smoker -- 1.00 packs/day  . Smokeless tobacco: Not on file  . Alcohol Use: No    Review of Systems  Constitutional: Negative for fever. Cardiovascular: Negative for chest pain. Respiratory: Negative for shortness of breath. Gastrointestinal: Positive for epigastric pain, vomiting, nausea Genitourinary: Negative for dysuria. Musculoskeletal: Negative for back pain. Skin: Negative for rash. Neurological: Negative for headaches, focal weakness or numbness.   10-point ROS otherwise negative.  ____________________________________________   PHYSICAL EXAM:  VITAL SIGNS: ED Triage Vitals  Enc Vitals Group     BP 08/16/14 1209 178/107 mmHg     Pulse Rate 08/16/14 1209 80     Resp 08/16/14 1209 20     Temp 08/16/14 1209 98.1 F (36.7 C)     Temp Source 08/16/14 1209 Oral     SpO2 08/16/14 1209 98 %     Weight 08/16/14 1209 225 lb (102.059 kg)     Height 08/16/14 1209 6\' 2"  (1.88 m)     Head Cir --      Peak Flow --      Pain Score 08/16/14 1210 10   Constitutional: Alert and oriented. Well appearing and in no distress. Eyes: Conjunctivae are normal. PERRL. Normal extraocular movements. ENT   Head: Normocephalic and atraumatic.   Nose: No congestion/rhinnorhea.   Mouth/Throat: Mucous membranes are moist.   Neck: No stridor. Hematological/Lymphatic/Immunilogical: No cervical lymphadenopathy. Cardiovascular:  Normal rate, regular rhythm.  No murmurs, rubs, or gallops. Respiratory: Normal respiratory effort without tachypnea nor retractions. Breath sounds are clear and equal bilaterally. No wheezes/rales/rhonchi. Gastrointestinal: Soft and minimally tender in the epigastric region. No rebound, no guarding. Genitourinary: Deferred Musculoskeletal: Normal range of motion in all extremities. No joint effusions.  No lower extremity tenderness nor edema. Neurologic:  Normal speech and language. No gross focal neurologic deficits are appreciated. Speech is normal.  Skin:  Skin is warm, dry and intact. No rash noted. Psychiatric: Mood and affect are normal. Speech and behavior are normal. Patient exhibits appropriate insight and judgment.  ____________________________________________    LABS (pertinent positives/negatives)  Labs Reviewed  LIPASE, BLOOD     ____________________________________________   EKG  None  ____________________________________________    RADIOLOGY  None  ____________________________________________   PROCEDURES  Procedure(s) performed: None  Critical Care performed: No  ____________________________________________   INITIAL IMPRESSION / ASSESSMENT AND PLAN / ED COURSE  Pertinent labs & imaging results that were available during my care of the patient were reviewed by me and considered in my medical decision making (see chart for details).  Patient here with concerns for epigastric pain. She with history of frequent visits to emergency departments for similar pain. Given clinical chart review I do have some concerns about treating him with narcotic pain medication at this time. I discussed with patient my concerns and that we will try IM medications.  ----------------------------------------- 2:59 PM on 08/16/2014 -----------------------------------------   I have ordered a lipase to check for any acute  elevation.  ----------------------------------------- 3:25 PM on 08/16/2014 -----------------------------------------  Lipase within normal limits. Patient was given [facility] about [facility] via our Education officer, museum. I encourage patient to follow up with primary care physician.  ____________________________________________   FINAL CLINICAL IMPRESSION(S) / ED DIAGNOSES  Final diagnoses:  Epigastric pain     Nance Pear, MD 08/16/14 1525

## 2014-08-16 NOTE — ED Notes (Signed)
Pt discharges @ 2:50pm. Left unit @ 3:00pm.

## 2014-08-16 NOTE — Discharge Instructions (Signed)
Please seek medical attention for any high fevers, chest pain, shortness of breath, change in behavior, persistent vomiting, bloody stool or any other new or concerning symptoms.  Abdominal Pain Many things can cause abdominal pain. Usually, abdominal pain is not caused by a disease and will improve without treatment. It can often be observed and treated at home. Your health care provider will do a physical exam and possibly order blood tests and X-rays to help determine the seriousness of your pain. However, in many cases, more time must pass before a clear cause of the pain can be found. Before that point, your health care provider may not know if you need more testing or further treatment. HOME CARE INSTRUCTIONS  Monitor your abdominal pain for any changes. The following actions may help to alleviate any discomfort you are experiencing:  Only take over-the-counter or prescription medicines as directed by your health care provider.  Do not take laxatives unless directed to do so by your health care provider.  Try a clear liquid diet (broth, tea, or water) as directed by your health care provider. Slowly move to a bland diet as tolerated. SEEK MEDICAL CARE IF:  You have unexplained abdominal pain.  You have abdominal pain associated with nausea or diarrhea.  You have pain when you urinate or have a bowel movement.  You experience abdominal pain that wakes you in the night.  You have abdominal pain that is worsened or improved by eating food.  You have abdominal pain that is worsened with eating fatty foods.  You have a fever. SEEK IMMEDIATE MEDICAL CARE IF:   Your pain does not go away within 2 hours.  You keep throwing up (vomiting).  Your pain is felt only in portions of the abdomen, such as the right side or the left lower portion of the abdomen.  You pass bloody or black tarry stools. MAKE SURE YOU:  Understand these instructions.   Will watch your condition.   Will  get help right away if you are not doing well or get worse.  Document Released: 12/28/2004 Document Revised: 03/25/2013 Document Reviewed: 11/27/2012 [facility] Patient Information 2015 [place], [place]. This information is not intended to replace advice given to you by your health care provider. Make sure you discuss any questions you have with your health care provider.

## 2014-08-17 ENCOUNTER — Emergency Department
Admission: EM | Admit: 2014-08-17 | Discharge: 2014-08-17 | Disposition: A | Discharge status: Home / Self Care | Payer: Self-pay | Attending: Emergency Medicine | Admitting: Emergency Medicine

## 2014-08-17 ENCOUNTER — Encounter: Payer: Self-pay | Admitting: *Deleted

## 2014-08-17 DIAGNOSIS — G43A0 Cyclical vomiting, not intractable: Secondary | ICD-10-CM | POA: Insufficient documentation

## 2014-08-17 DIAGNOSIS — Z880 Allergy status to penicillin: Secondary | ICD-10-CM | POA: Insufficient documentation

## 2014-08-17 DIAGNOSIS — Z720 Tobacco use: Secondary | ICD-10-CM | POA: Insufficient documentation

## 2014-08-17 DIAGNOSIS — Z79899 Other long term (current) drug therapy: Secondary | ICD-10-CM | POA: Insufficient documentation

## 2014-08-17 DIAGNOSIS — R1115 Cyclical vomiting syndrome unrelated to migraine: Secondary | ICD-10-CM

## 2014-08-17 DIAGNOSIS — R109 Unspecified abdominal pain: Secondary | ICD-10-CM | POA: Insufficient documentation

## 2014-08-17 LAB — COMPREHENSIVE METABOLIC PANEL
ALT: 54 U/L (ref 17–63)
ANION GAP: 11 (ref 5–15)
AST: 65 U/L — ABNORMAL HIGH (ref 15–41)
Albumin: 4.5 g/dL (ref 3.5–5.0)
Alkaline Phosphatase: 75 U/L (ref 38–126)
BILIRUBIN TOTAL: 0.7 mg/dL (ref 0.3–1.2)
BUN: 9 mg/dL (ref 6–20)
CHLORIDE: 97 mmol/L — AB (ref 101–111)
CO2: 27 mmol/L (ref 22–32)
Calcium: 9.4 mg/dL (ref 8.9–10.3)
Creatinine, Ser: 1.05 mg/dL (ref 0.61–1.24)
Glucose, Bld: 115 mg/dL — ABNORMAL HIGH (ref 65–99)
Potassium: 2.8 mmol/L — CL (ref 3.5–5.1)
Sodium: 135 mmol/L (ref 135–145)
Total Protein: 7.9 g/dL (ref 6.5–8.1)

## 2014-08-17 LAB — CBC WITH DIFFERENTIAL/PLATELET
BASOS ABS: 0.2 10*3/uL — AB (ref 0–0.1)
Basophils Relative: 1 %
EOS ABS: 0 10*3/uL (ref 0–0.7)
EOS PCT: 0 %
HCT: 42.2 % (ref 40.0–52.0)
Hemoglobin: 14.1 g/dL (ref 13.0–18.0)
LYMPHS PCT: 19 %
Lymphs Abs: 2.8 10*3/uL (ref 1.0–3.6)
MCH: 30.3 pg (ref 26.0–34.0)
MCHC: 33.5 g/dL (ref 32.0–36.0)
MCV: 90.3 fL (ref 80.0–100.0)
Monocytes Absolute: 1.6 10*3/uL — ABNORMAL HIGH (ref 0.2–1.0)
Monocytes Relative: 11 %
Neutro Abs: 10.1 10*3/uL — ABNORMAL HIGH (ref 1.4–6.5)
Neutrophils Relative %: 69 %
Platelets: 177 10*3/uL (ref 150–440)
RBC: 4.67 MIL/uL (ref 4.40–5.90)
RDW: 13.2 % (ref 11.5–14.5)
WBC: 14.7 10*3/uL — AB (ref 3.8–10.6)

## 2014-08-17 LAB — LIPASE, BLOOD: LIPASE: 45 U/L (ref 22–51)

## 2014-08-17 MED ORDER — POTASSIUM CHLORIDE 10 MEQ/100ML IV SOLN
10.0000 meq | Freq: Once | INTRAVENOUS | Status: AC
  Administered 2014-08-17: 10 meq via INTRAVENOUS
  Filled 2014-08-17: qty 100

## 2014-08-17 MED ORDER — ONDANSETRON HCL 4 MG/2ML IJ SOLN: 4.0000 mg | Freq: Once | INTRAMUSCULAR | Status: DC

## 2014-08-17 MED ORDER — POTASSIUM CHLORIDE ER 10 MEQ PO TBCR: 20.0000 meq | EXTENDED_RELEASE_TABLET | Freq: Every day | ORAL | Status: DC

## 2014-08-17 MED ORDER — SODIUM CHLORIDE 0.9 % IV SOLN: 1000.0000 mL | Freq: Once | INTRAVENOUS | Status: DC

## 2014-08-17 MED ORDER — PROCHLORPERAZINE EDISYLATE 5 MG/ML IJ SOLN
10.0000 mg | Freq: Once | INTRAMUSCULAR | Status: AC
  Administered 2014-08-17: 10 mg via INTRAVENOUS

## 2014-08-17 MED ORDER — PROCHLORPERAZINE EDISYLATE 5 MG/ML IJ SOLN
INTRAMUSCULAR | Status: AC
  Administered 2014-08-17: 10 mg via INTRAVENOUS
  Filled 2014-08-17: qty 2

## 2014-08-17 MED ORDER — PROCHLORPERAZINE EDISYLATE 5 MG/ML IJ SOLN
5.0000 mg | Freq: Four times a day (QID) | INTRAMUSCULAR | Status: DC | PRN
  Administered 2014-08-17: 5 mg via INTRAVENOUS

## 2014-08-17 MED ORDER — PROCHLORPERAZINE EDISYLATE 5 MG/ML IJ SOLN
INTRAMUSCULAR | Status: AC
  Filled 2014-08-17: qty 2

## 2014-08-17 NOTE — ED Notes (Signed)
Pt pacing around room, coming up to door looking out in hallway. Pt in NAD.

## 2014-08-17 NOTE — Discharge Instructions (Signed)
Cyclic Vomiting Syndrome °Cyclic vomiting syndrome is a benign condition in which patients experience bouts or cycles of severe nausea and vomiting that last for hours or even days. The bouts of nausea and vomiting alternate with longer periods of no symptoms and generally good health. Cyclic vomiting syndrome occurs mostly in children, but can affect adults. °CAUSES  °CVS has no known cause. Each episode is typically similar to the previous ones. The episodes tend to:  °· Start at about the same time of day. °· Last the same length of time. °· Present the same symptoms at the same level of intensity. °Cyclic vomiting syndrome can begin at any age in children and adults. Cyclic vomiting syndrome usually starts between the ages of 3 and 7 years. In adults, episodes tend to occur less often than they do in children, but they last longer. Furthermore, the events or situations that trigger episodes in adults cannot always be pinpointed as easily as they can in children. °There are 4 phases of cyclic vomiting syndrome: °1. Prodrome. The prodrome phase signals that an episode of nausea and vomiting is about to begin. This phase can last from just a few minutes to several hours. This phase is often marked by belly (abdominal) pain. Sometimes taking medicine early in the prodrome phase can stop an episode in progress. However, sometimes there is no warning. A person may simply wake up in the middle of the night or early morning and begin vomiting. °2. Episode. The episode phase consists of: °· Severe vomiting. °· Nausea. °· Gagging (retching). °3. Recovery. The recovery phase begins when the nausea and vomiting stop. Healthy color, appetite, and energy return. °4. Symptom-free interval. The symptom-free interval phase is the period between episodes when no symptoms are present. °TRIGGERS °Episodes can be triggered by an infection or event. Examples of triggers include: °· Infections. °· Colds, allergies, sinus problems, and  the flu. °· Eating certain foods such as chocolate or cheese. °· Foods with monosodium glutamate (MSG) or preservatives. °· Fast foods. °· Pre-packaged foods. °· Foods with low nutritional value (junk foods). °· Overeating. °· Eating just before going to bed. °· Hot weather. °· Dehydration. °· Not enough sleep or poor sleep quality. °· Physical exhaustion. °· Menstruation. °· Motion sickness. °· Emotional stress (school or home difficulties). °· Excitement or stress. °SYMPTOMS  °The main symptoms of cyclic vomiting syndrome are: °· Severe vomiting. °· Nausea. °· Gagging (retching). °Episodes usually begin at night or the first thing in the morning. Episodes may include vomiting or retching up to 5 or 6 times an hour during the worst of the episode. Episodes usually last anywhere from 1 to 4 days. Episodes can last for up to 10 days. Other symptoms include: °· Paleness. °· Exhaustion. °· Listlessness. °· Abdominal pain. °· Loose stools or diarrhea. °Sometimes the nausea and vomiting are so severe that a person appears to be almost unconscious. Sensitivity to light, headache, fever, dizziness, may also accompany an episode. In addition, the vomiting may cause drooling and excessive thirst. Drinking water usually leads to more vomiting, though the water can dilute the acid in the vomit, making the episode a little less painful. Continuous vomiting can lead to dehydration, which means that the body has lost excessive water and salts. °DIAGNOSIS  °Cyclic vomiting syndrome is hard to diagnose because there are no clear tests to identify it. A caregiver must diagnose cyclic vomiting syndrome by looking at symptoms and medical history. A caregiver must exclude more common diseases   or disorders that can also cause nausea and vomiting. Also, diagnosis takes time because caregivers need to identify a pattern or cycle to the vomiting. °TREATMENT  °Cyclic vomiting syndrome cannot be cured. Treatment varies, but people with  cyclic vomiting syndrome should get plenty of rest and sleep and take medications that prevent, stop, or lessen the vomiting episodes and other symptoms. °People whose episodes are frequent and long-lasting may be treated during the symptom-free intervals in an effort to prevent or ease future episodes. The symptom-free phase is a good time to eliminate anything known to trigger an episode. For example, if episodes are brought on by stress or excitement, this period is the time to find ways to reduce stress and stay calm. If sinus problems or allergies cause episodes, those conditions should be treated. The triggers listed above should be avoided or prevented. °Because of the similarities between migraine and cyclic vomiting syndrome, caregivers treat some people with severe cyclic vomiting syndrome with drugs that are also used for migraine headaches. The drugs are designed to: °· Prevent episodes. °· Reduce their frequency. °· Lessen their severity. °HOME CARE INSTRUCTIONS °Once a vomiting episode begins, treatment is supportive. It helps to stay in bed and sleep in a dark, quiet room. Severe nausea and vomiting may require hospitalization and intravenous (IV) fluids to prevent dehydration. Relaxing medications (sedatives) may help if the nausea continues. Sometimes, during the prodrome phase, it is possible to stop an episode from happening altogether. Only take over-the-counter or prescription medicines for pain, discomfort or fever as directed by your caregiver. Do not give aspirin to children. °During the recovery phase, drinking water and replacing lost electrolytes (salts in the blood) are very important. Electrolytes are salts that the body needs to function well and stay healthy. Symptoms during the recovery phase can vary. Some people find that their appetites return to normal immediately, while others need to begin by drinking clear liquids and then move slowly to solid food. °RELATED COMPLICATIONS °The  severe vomiting that defines cyclic vomiting syndrome is a risk factor for several complications: °· Dehydration--Vomiting causes the body to lose water quickly. °· Electrolyte imbalance--Vomiting also causes the body to lose the important salts it needs to keep working properly. °· Peptic esophagitis--The tube that connects the mouth to the stomach (esophagus) becomes injured from the stomach acid that comes up with the vomit. °· Hematemesis--The esophagus becomes irritated and bleeds, so blood mixes with the vomit. °· Mallory-Weiss tear--The lower end of the esophagus may tear open or the stomach may bruise from vomiting or retching. °· Tooth decay--The acid in the vomit can hurt the teeth by corroding the tooth enamel. °SEEK MEDICAL CARE IF: °You have questions or problems. °Document Released: 05/29/2001 Document Revised: 06/12/2011 Document Reviewed: 06/27/2010 °[employer]® Patient Information ©2015 [employer]. This information is not intended to replace advice given to you by your health care provider. Make sure you discuss any questions you have with your health care provider. ° °

## 2014-08-17 NOTE — ED Notes (Signed)
Pt informed to return if any life threatening symptoms occur.  

## 2014-08-17 NOTE — ED Notes (Signed)
Pt c/o abdominal pain, nausea, vomiting since yesterday. Pt presents pale, diaphoretic and agitated.

## 2014-08-17 NOTE — ED Notes (Signed)
CRITICAL VALUE ALERT  Critical value received:  2.8 Potassium   Date of notification:  08/17/14  Time of notification:  0715  Critical value read back:Yes.    Nurse who received alert:  Ernst Spell   MD notified (1st page):  Dr. Corky Downs   Time of first page:  N/A  MD notified (2nd page):  Time of second page:  Responding MD:  Dr. Corky Downs   Time MD responded:  [phone]

## 2014-08-17 NOTE — ED Notes (Signed)
Pt has active care plan. Protocol med cancelled until evaluated by MD.

## 2014-08-17 NOTE — ED Provider Notes (Addendum)
[facility] Emergency Department Provider Note  ____________________________________________  Time seen: 7 AM  I have reviewed the triage vital signs and the nursing notes.   HISTORY  Chief Complaint Emesis and Abdominal Pain      HPI Albert Peters is a 29 y.o. male who is well-known to this institution who presents with nausea vomiting and abdominal cramping. Patient has been seen in our ED numerous times for similar complaints. He does have a care plan which strongly suggests no narcotic pain medication. He was seen yesterday and given antiemetics but he notes that he has continued to have nausea and vomiting and pain. He denies fevers chills or diarrhea. The pain is crampy in nature in his epigastric area and he describes it as severe. This pain is essentially chronic for this patient, the only thing that makes it better is IV narcoticsper patient.     Past Medical History  Diagnosis Date  . Pancreatitis   . Hepatitis C   . Gastritis     Patient Active Problem List   Diagnosis Date Noted  . Elevated LFTs 06/11/2012  . Abdominal pain 06/11/2012  . History of pancreatitis 06/11/2012  . Gastritis 06/11/2012  . Tobacco abuse 06/11/2012  . Cannabis abuse 06/11/2012    History reviewed. No pertinent past surgical history.  Current Outpatient Rx  Name  Route  Sig  Dispense  Refill  . dicyclomine (BENTYL) 20 MG tablet   Oral   Take 20 mg by mouth 2 (two) times daily.         . famotidine (PEPCID) 20 MG tablet   Oral   Take 20 mg by mouth 2 (two) times daily.         Marland Kitchen omeprazole (PRILOSEC) 20 MG capsule   Oral   Take 20 mg by mouth 2 (two) times daily.         . ondansetron (ZOFRAN) 4 MG tablet   Oral   Take 4 mg by mouth as needed. Nausea/vomiting         . Oxycodone HCl 10 MG TABS   Oral   Take 5-10 mg by mouth every 4 (four) hours as needed (nausea/vomiting).         . prochlorperazine (COMPAZINE) 10 MG tablet   Oral   Take 1 tablet (10 mg total) by mouth every 6 (six) hours as needed for nausea.   15 tablet   1   . prochlorperazine (COMPAZINE) 25 MG suppository   Rectal   Place 1 suppository (25 mg total) rectally every 12 (twelve) hours as needed for nausea.   12 suppository   1   . promethazine (PHENERGAN) 25 MG tablet   Oral   Take 25 mg by mouth every 6 (six) hours as needed. Nausea/ up to 20 doses         . promethazine (PHENERGAN) 25 MG tablet   Oral   Take 1 tablet (25 mg total) by mouth every 6 (six) hours as needed for nausea or vomiting.   20 tablet   0     Allergies Penicillin g; Shellfish allergy; and Sulfa antibiotics  Family History  Problem Relation Age of Onset  . CAD Father     Social History History  Substance Use Topics  . Smoking status: Heavy Tobacco Smoker -- 1.00 packs/day  . Smokeless tobacco: Never Used  . Alcohol Use: No    Review of Systems  Constitutional: Negative for fever. Eyes: Negative for visual changes. ENT:  Negative for sore throat. Cardiovascular: Negative for chest pain. Respiratory: Negative for shortness of breath. Gastrointestinal: Positive abdominal pain and vomiting, no diarrhea Genitourinary: Negative for dysuria. Musculoskeletal: Negative for back pain. Skin: Negative for rash. Neurological: Negative for headaches, focal weakness or numbness. Psychiatric: Significant anxiety  10-point ROS otherwise negative.  ____________________________________________   PHYSICAL EXAM:  VITAL SIGNS: ED Triage Vitals  Enc Vitals Group     BP 08/17/14 0631 146/103 mmHg     Pulse Rate 08/17/14 0631 73     Resp 08/17/14 0631 20     Temp 08/17/14 0631 99 F (37.2 C)     Temp Source 08/17/14 0631 Oral     SpO2 08/17/14 0631 100 %     Weight 08/17/14 0631 220 lb (99.791 kg)     Height 08/17/14 0631 6\' 2"  (1.88 m)     Head Cir --      Peak Flow --      Pain Score 08/17/14 0633 10     Pain Loc --      Pain Edu? --      Excl. in [place]?  --      Constitutional: Alert and oriented. Patient no acute distress, anxious and irritable ENT   Head: Normocephalic and atraumatic.   Mouth/Throat: Mucous membranes are moist. Hematological/Lymphatic/Immunilogical: No cervical lymphadenopathy. Cardiovascular: Normal rate, regular rhythm. Normal and symmetric distal pulses are present in all extremities. No murmurs, rubs, or gallops. Respiratory: Normal respiratory effort without tachypnea nor retractions. Breath sounds are clear and equal bilaterally. No wheezes/rales/rhonchi. Gastrointestinal: Soft and nontender. No distention. There is no CVA tenderness. Genitourinary: deferred Musculoskeletal: Nontender with normal range of motion in all extremities. No joint effusions.  No lower extremity tenderness nor edema. Neurologic:  Normal speech and language. No gross focal neurologic deficits are appreciated. Speech is normal.  Skin:  Skin is warm, dry and intact. No rash noted. Psychiatric: Patient is anxious and aggressive  ____________________________________________    LABS (pertinent positives/negatives)  Potassium noted at 2.8  ____________________________________________   EKG None.    ____________________________________________    RADIOLOGY  None  ____________________________________________   PROCEDURES  Procedure(s) performed: None  Critical Care performed: None  ____________________________________________   INITIAL IMPRESSION / ASSESSMENT AND PLAN / ED COURSE  Pertinent labs & imaging results that were available during my care of the patient were reviewed by me and considered in my medical decision making (see chart for details).  Taking care of this patient is always difficult given that he is rather aggressive and his pain is essentially chronic and he has a history of moving from emergency department to emergency department in search of narcotics. Today his potassium is slightly low, thus  we will replete. I will give Compazine for nausea as he says that has worked in the past. But I have told him no IV narcotics.  ____________________________________________ ----------------------------------------- 10:02 AM on 08/17/2014 -----------------------------------------  Patient reports feeling better and is tolerating by mouth's. Is not having nausea or vomiting. Offered admission to the patient given mildly low potassium but he is agreeable to taking supplementation at home and is not willing to stay in the hospital.  Patient left the emergency department and did not take his prescription for potassium  FINAL CLINICAL IMPRESSION(S) / ED DIAGNOSES  Final diagnoses:  Non-intractable cyclical vomiting with nausea     Lavonia Drafts, MD 08/17/14 1525  Lavonia Drafts, MD 08/17/14 [phone]

## 2014-08-17 NOTE — ED Notes (Signed)
Pharm notifed about medications needed for pt.

## 2014-08-17 NOTE — ED Notes (Signed)
Kinner at bedside.

## 2014-08-19 ENCOUNTER — Encounter: Payer: Self-pay | Admitting: Emergency Medicine

## 2014-08-19 ENCOUNTER — Emergency Department
Admission: EM | Admit: 2014-08-19 | Discharge: 2014-08-19 | Discharge status: Against Medical Advice | Payer: Self-pay | Attending: Emergency Medicine | Admitting: Emergency Medicine

## 2014-08-19 ENCOUNTER — Emergency Department
Admission: EM | Admit: 2014-08-19 | Discharge: 2014-08-19 | Disposition: A | Discharge status: Home / Self Care | Payer: Self-pay

## 2014-08-19 DIAGNOSIS — Z720 Tobacco use: Secondary | ICD-10-CM | POA: Insufficient documentation

## 2014-08-19 DIAGNOSIS — R112 Nausea with vomiting, unspecified: Secondary | ICD-10-CM | POA: Insufficient documentation

## 2014-08-19 NOTE — ED Notes (Signed)
Pt with cyclic vomiting syndrome. Wants to be seen for n/v/d before it "gets too bad".

## 2014-08-19 NOTE — ED Notes (Signed)
Pt presents with n/v/d.

## 2014-10-06 ENCOUNTER — Emergency Department
Admission: EM | Admit: 2014-10-06 | Discharge: 2014-10-06 | Disposition: A | Discharge status: Home / Self Care | Payer: Self-pay | Attending: Emergency Medicine | Admitting: Emergency Medicine

## 2014-10-06 ENCOUNTER — Encounter: Payer: Self-pay | Admitting: Emergency Medicine

## 2014-10-06 DIAGNOSIS — Z880 Allergy status to penicillin: Secondary | ICD-10-CM | POA: Insufficient documentation

## 2014-10-06 DIAGNOSIS — G8929 Other chronic pain: Secondary | ICD-10-CM | POA: Insufficient documentation

## 2014-10-06 DIAGNOSIS — Z8719 Personal history of other diseases of the digestive system: Secondary | ICD-10-CM | POA: Insufficient documentation

## 2014-10-06 DIAGNOSIS — Z720 Tobacco use: Secondary | ICD-10-CM | POA: Insufficient documentation

## 2014-10-06 DIAGNOSIS — Z79899 Other long term (current) drug therapy: Secondary | ICD-10-CM | POA: Insufficient documentation

## 2014-10-06 LAB — URINALYSIS COMPLETE WITH MICROSCOPIC (ARMC ONLY)
Bilirubin Urine: NEGATIVE
Glucose, UA: NEGATIVE mg/dL
HGB URINE DIPSTICK: NEGATIVE
Nitrite: NEGATIVE
PH: 6 (ref 5.0–8.0)
PROTEIN: 30 mg/dL — AB
Specific Gravity, Urine: 1.027 (ref 1.005–1.030)

## 2014-10-06 LAB — CBC WITH DIFFERENTIAL/PLATELET
BASOS ABS: 0 10*3/uL (ref 0–0.1)
BASOS PCT: 0 %
Eosinophils Absolute: 0 10*3/uL (ref 0–0.7)
Eosinophils Relative: 0 %
HCT: 39.8 % — ABNORMAL LOW (ref 40.0–52.0)
Hemoglobin: 13.1 g/dL (ref 13.0–18.0)
LYMPHS PCT: 19 %
Lymphs Abs: 2.9 10*3/uL (ref 1.0–3.6)
MCH: 30.6 pg (ref 26.0–34.0)
MCHC: 33 g/dL (ref 32.0–36.0)
MCV: 92.8 fL (ref 80.0–100.0)
MONO ABS: 1.3 10*3/uL — AB (ref 0.2–1.0)
MONOS PCT: 9 %
NEUTROS ABS: 10.6 10*3/uL — AB (ref 1.4–6.5)
NEUTROS PCT: 72 %
Platelets: 157 10*3/uL (ref 150–440)
RBC: 4.3 MIL/uL — ABNORMAL LOW (ref 4.40–5.90)
RDW: 13.1 % (ref 11.5–14.5)
WBC: 14.8 10*3/uL — ABNORMAL HIGH (ref 3.8–10.6)

## 2014-10-06 LAB — LIPASE, BLOOD: Lipase: 23 U/L (ref 22–51)

## 2014-10-06 LAB — COMPREHENSIVE METABOLIC PANEL
ALK PHOS: 66 U/L (ref 38–126)
ALT: 38 U/L (ref 17–63)
AST: 34 U/L (ref 15–41)
Albumin: 4.4 g/dL (ref 3.5–5.0)
Anion gap: 12 (ref 5–15)
BILIRUBIN TOTAL: 0.8 mg/dL (ref 0.3–1.2)
BUN: 15 mg/dL (ref 6–20)
CO2: 25 mmol/L (ref 22–32)
Calcium: 9.3 mg/dL (ref 8.9–10.3)
Chloride: 101 mmol/L (ref 101–111)
Creatinine, Ser: 1.2 mg/dL (ref 0.61–1.24)
GLUCOSE: 118 mg/dL — AB (ref 65–99)
POTASSIUM: 3 mmol/L — AB (ref 3.5–5.1)
Sodium: 138 mmol/L (ref 135–145)
TOTAL PROTEIN: 7.9 g/dL (ref 6.5–8.1)

## 2014-10-06 MED ORDER — PROCHLORPERAZINE EDISYLATE 5 MG/ML IJ SOLN
10.0000 mg | Freq: Four times a day (QID) | INTRAMUSCULAR | Status: DC | PRN
  Administered 2014-10-06: 10 mg via INTRAVENOUS

## 2014-10-06 MED ORDER — KETOROLAC TROMETHAMINE 10 MG PO TABS: 10.0000 mg | ORAL_TABLET | Freq: Once | ORAL | Status: DC

## 2014-10-06 MED ORDER — ONDANSETRON HCL 4 MG/2ML IJ SOLN
4.0000 mg | Freq: Once | INTRAMUSCULAR | Status: AC
  Administered 2014-10-06: 4 mg via INTRAVENOUS

## 2014-10-06 MED ORDER — KETOROLAC TROMETHAMINE 10 MG PO TABS: 10.0000 mg | ORAL_TABLET | Freq: Three times a day (TID) | ORAL | Status: DC | PRN

## 2014-10-06 MED ORDER — KETOROLAC TROMETHAMINE 30 MG/ML IJ SOLN
INTRAMUSCULAR | Status: AC
  Administered 2014-10-06: 30 mg via INTRAVENOUS
  Filled 2014-10-06: qty 1

## 2014-10-06 MED ORDER — KETOROLAC TROMETHAMINE 30 MG/ML IJ SOLN
30.0000 mg | Freq: Once | INTRAMUSCULAR | Status: AC
  Administered 2014-10-06: 30 mg via INTRAVENOUS

## 2014-10-06 MED ORDER — ONDANSETRON HCL 4 MG/2ML IJ SOLN
INTRAMUSCULAR | Status: AC
  Administered 2014-10-06: 4 mg via INTRAVENOUS
  Filled 2014-10-06: qty 2

## 2014-10-06 MED ORDER — PROCHLORPERAZINE EDISYLATE 5 MG/ML IJ SOLN
INTRAMUSCULAR | Status: AC
  Filled 2014-10-06: qty 2

## 2014-10-06 NOTE — ED Notes (Signed)
Pt states unable to give urine sample at this time 

## 2014-10-06 NOTE — ED Notes (Signed)
Dr. Brown at bedside

## 2014-10-06 NOTE — ED Notes (Signed)
Pt arrived to the ED for complaints of severe abdominal pain and vomiting. Pt states that he is a chronic pancreatitis Pt  And the he is experiencing pancreatitis pain. Pt is AOx4  And appears to be in severe pain.

## 2014-10-06 NOTE — ED Notes (Signed)
Pt reports episode of pancreatitis x 2 hours.  PT reports awaken from sleep with pain above umbilicus, rated 79/02.  Pt reports he has vomited 6 times in 2 hours.  Pt NAD at this time, not actively vomiting.

## 2014-10-06 NOTE — Discharge Instructions (Signed)

## 2014-10-07 NOTE — ED Provider Notes (Signed)
[facility] Emergency Department Provider Note  ____________________________________________  Time seen: 4:00 AM  I have reviewed the triage vital signs and the nursing notes.   HISTORY  Chief Complaint Pancreatitis      HPI Albert Peters is a 29 y.o. male     Past Medical History  Diagnosis Date  . Pancreatitis   . Hepatitis C   . Gastritis   . Pancreatitis   . Hepatitis C     Patient Active Problem List   Diagnosis Date Noted  . Elevated LFTs 06/11/2012  . Abdominal pain 06/11/2012  . History of pancreatitis 06/11/2012  . Gastritis 06/11/2012  . Tobacco abuse 06/11/2012  . Cannabis abuse 06/11/2012    History reviewed. No pertinent past surgical history.  Current Outpatient Rx  Name  Route  Sig  Dispense  Refill  . dicyclomine (BENTYL) 20 MG tablet   Oral   Take 20 mg by mouth 2 (two) times daily.         . famotidine (PEPCID) 20 MG tablet   Oral   Take 20 mg by mouth 2 (two) times daily.         Marland Kitchen ketorolac (TORADOL) 10 MG tablet   Oral   Take 1 tablet (10 mg total) by mouth every 8 (eight) hours as needed for moderate pain.   20 tablet   0   . omeprazole (PRILOSEC) 20 MG capsule   Oral   Take 20 mg by mouth 2 (two) times daily.         . ondansetron (ZOFRAN) 4 MG tablet   Oral   Take 4 mg by mouth as needed. Nausea/vomiting         . Oxycodone HCl 10 MG TABS   Oral   Take 5-10 mg by mouth every 4 (four) hours as needed (nausea/vomiting).         . potassium chloride (K-DUR) 10 MEQ tablet   Oral   Take 2 tablets (20 mEq total) by mouth daily.   20 tablet   0   . prochlorperazine (COMPAZINE) 10 MG tablet   Oral   Take 1 tablet (10 mg total) by mouth every 6 (six) hours as needed for nausea.   15 tablet   1   . prochlorperazine (COMPAZINE) 25 MG suppository   Rectal   Place 1 suppository (25 mg total) rectally every 12 (twelve) hours as needed for nausea.   12 suppository   1   . promethazine  (PHENERGAN) 25 MG tablet   Oral   Take 25 mg by mouth every 6 (six) hours as needed. Nausea/ up to 20 doses         . promethazine (PHENERGAN) 25 MG tablet   Oral   Take 1 tablet (25 mg total) by mouth every 6 (six) hours as needed for nausea or vomiting.   20 tablet   0     Allergies Penicillin g; Shellfish allergy; and Sulfa antibiotics  Family History  Problem Relation Age of Onset  . CAD Father     Social History History  Substance Use Topics  . Smoking status: Heavy Tobacco Smoker -- 1.00 packs/day  . Smokeless tobacco: Never Used  . Alcohol Use: No    Review of Systems  Constitutional: Negative for fever. Eyes: Negative for visual changes. ENT: Negative for sore throat. Cardiovascular: Negative for chest pain. Respiratory: Negative for shortness of breath. Gastrointestinal: Negative for abdominal pain, vomiting and diarrhea. Genitourinary: Negative for dysuria.  Musculoskeletal: Negative for back pain. Skin: Negative for rash. Neurological: Negative for headaches, focal weakness or numbness.  10-point ROS otherwise negative.  ____________________________________________   PHYSICAL EXAM:  VITAL SIGNS: ED Triage Vitals  Enc Vitals Group     BP 10/06/14 0421 131/100 mmHg     Pulse Rate 10/06/14 0421 72     Resp 10/06/14 0421 18     Temp 10/06/14 0421 98.5 F (36.9 C)     Temp Source 10/06/14 0421 Oral     SpO2 10/06/14 0421 100 %     Weight 10/06/14 0421 225 lb (102.059 kg)     Height 10/06/14 0421 6\' 2"  (1.88 m)     Head Cir --      Peak Flow --      Pain Score 10/06/14 0422 10     Pain Loc --      Pain Edu? --      Excl. in [place]? --     Constitutional: Alert and oriented. Well appearing and in no distress. Eyes: Conjunctivae are normal. PERRL. Normal extraocular movements. ENT   Head: Normocephalic and atraumatic.   Nose: No congestion/rhinnorhea.   Mouth/Throat: Mucous membranes are moist.   Neck: No stridor. Cardiovascular:  Normal rate, regular rhythm. Normal and symmetric distal pulses are present in all extremities. No murmurs, rubs, or gallops. Respiratory: Normal respiratory effort without tachypnea nor retractions. Breath sounds are clear and equal bilaterally. No wheezes/rales/rhonchi. Gastrointestinal: Tender to palpation in the epigastric region. No distention. There is no CVA tenderness. Genitourinary: deferred Musculoskeletal: Nontender with normal range of motion in all extremities. No joint effusions.  No lower extremity tenderness nor edema. Neurologic:  Normal speech and language. No gross focal neurologic deficits are appreciated. Speech is normal.  Skin:  Skin is warm, dry and intact. No rash noted. Psychiatric: Mood and affect are normal. Speech and behavior are normal. Patient exhibits appropriate insight and judgment.  ____________________________________________    LABS (pertinent positives/negatives)  Labs Reviewed  URINALYSIS COMPLETEWITH MICROSCOPIC ([facility] ONLY) - Abnormal; Notable for the following:    Color, Urine YELLOW (*)    APPearance CLEAR (*)    Ketones, ur 1+ (*)    Protein, ur 30 (*)    Leukocytes, UA 1+ (*)    Bacteria, UA RARE (*)    Squamous Epithelial / LPF 0-5 (*)    All other components within normal limits  COMPREHENSIVE METABOLIC PANEL - Abnormal; Notable for the following:    Potassium 3.0 (*)    Glucose, Bld 118 (*)    All other components within normal limits  CBC WITH DIFFERENTIAL/PLATELET - Abnormal; Notable for the following:    WBC 14.8 (*)    RBC 4.30 (*)    HCT 39.8 (*)    Neutro Abs 10.6 (*)    Monocytes Absolute 1.3 (*)    All other components within normal limits  LIPASE, BLOOD       INITIAL IMPRESSION / ASSESSMENT AND PLAN / ED COURSE  Pertinent labs & imaging results that were available during my care of the patient were reviewed by me and considered in my medical decision making (see chart for details).  Patient received IV Toradol and  Zofran 4 analgesia and anti-medic respectively. Patient continued to complain of persistent nausea such patient received Compazine 10 mg IV with improvement in nausea patient will be referred to chronic pain Dr. Primus Bravo  ____________________________________________   FINAL CLINICAL IMPRESSION(S) / ED DIAGNOSES  Final diagnoses:  Chronic pain  Gregor Hams, MD 10/07/14 [phone]

## 2014-10-31 IMAGING — CR DG ABDOMEN ACUTE W/ 1V CHEST
3 series · 3 of 3 positions shown · non-contrast
Comparison: None.

CLINICAL DATA: Shortness of breath, abdominal pain

ACUTE ABDOMEN SERIES (ABDOMEN 2 VIEW & CHEST 1 VIEW)

[w chest pa]
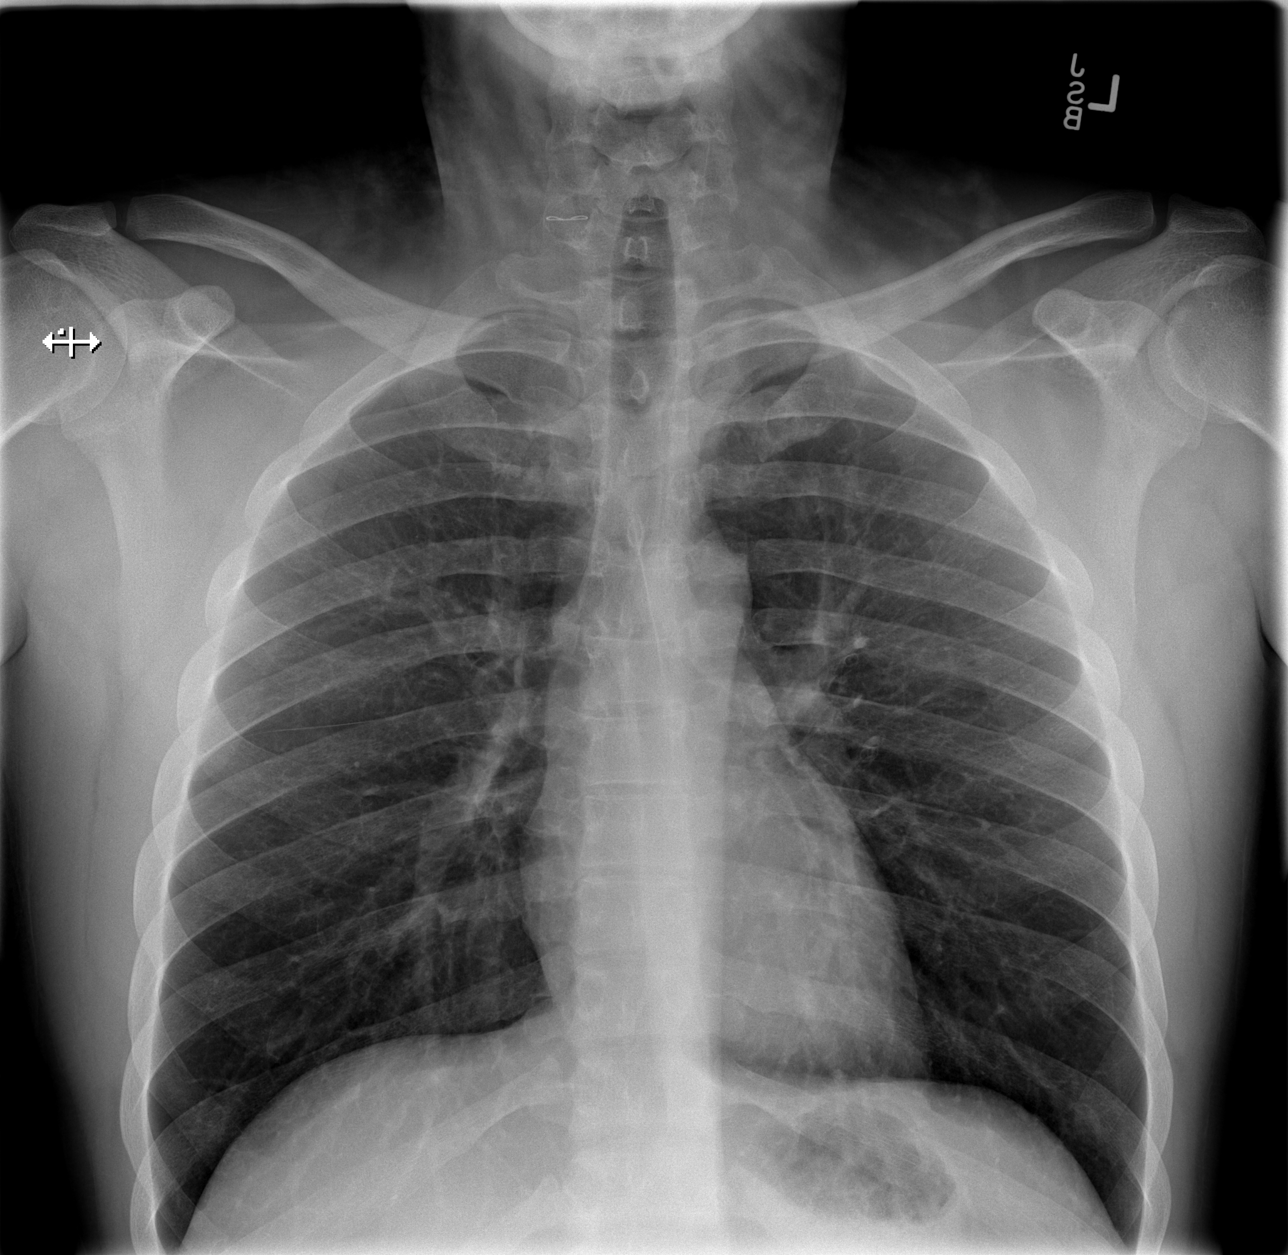

[w abdomen upright]
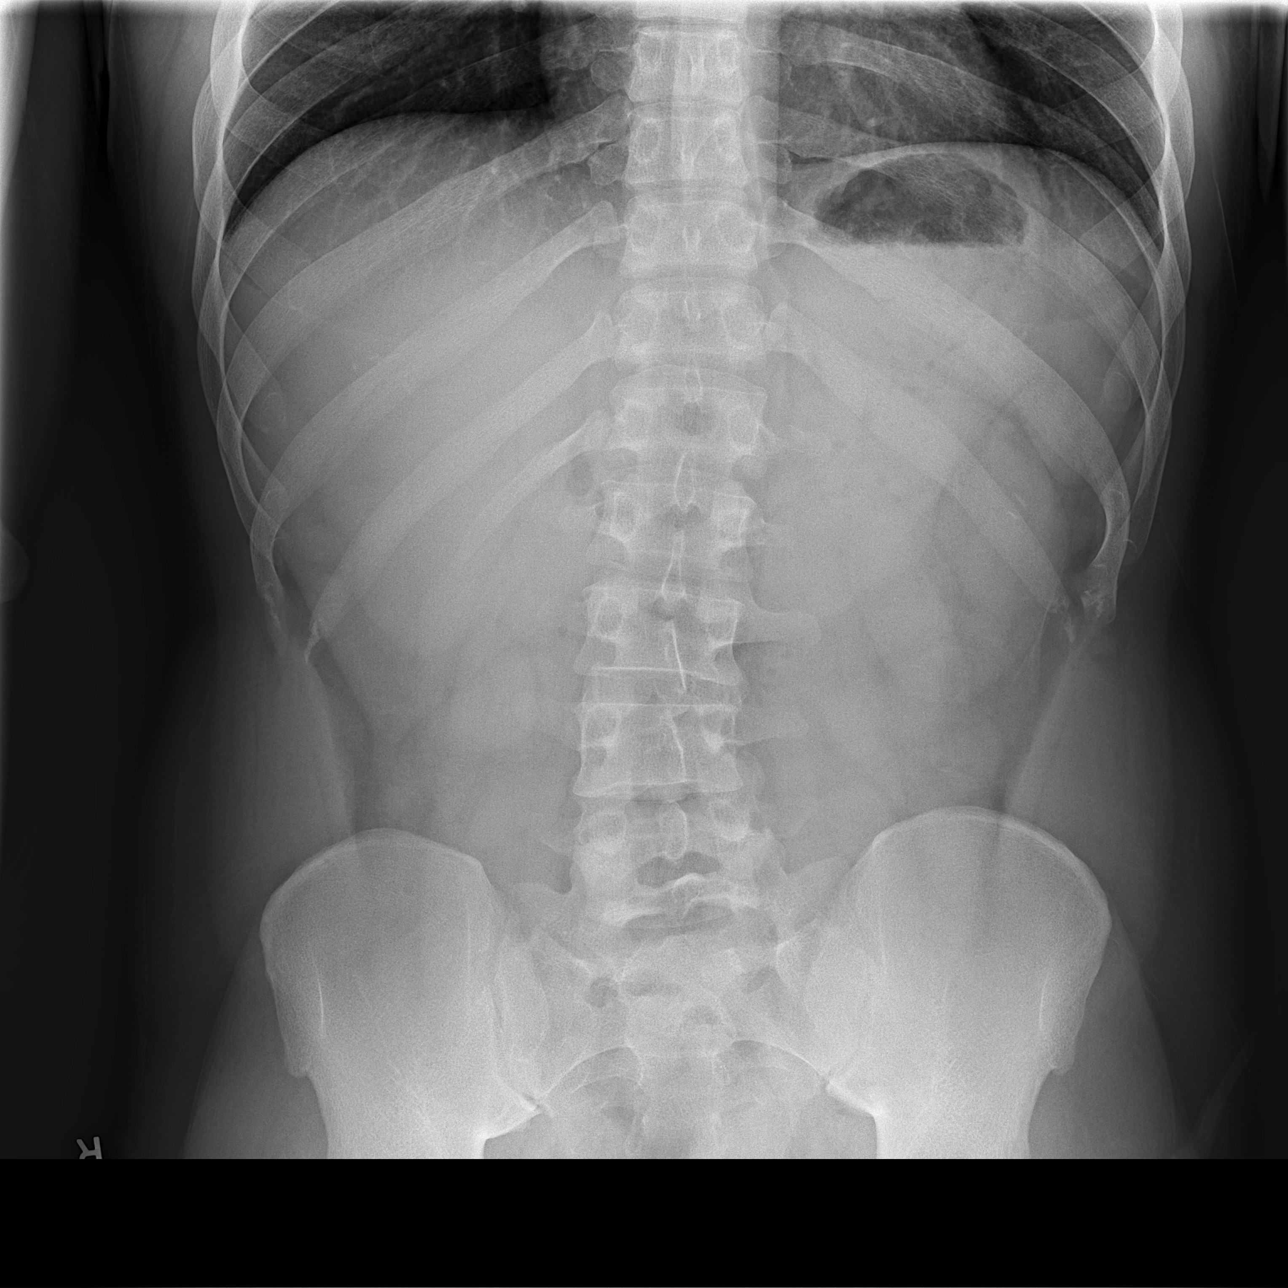

[t abdomen supine]
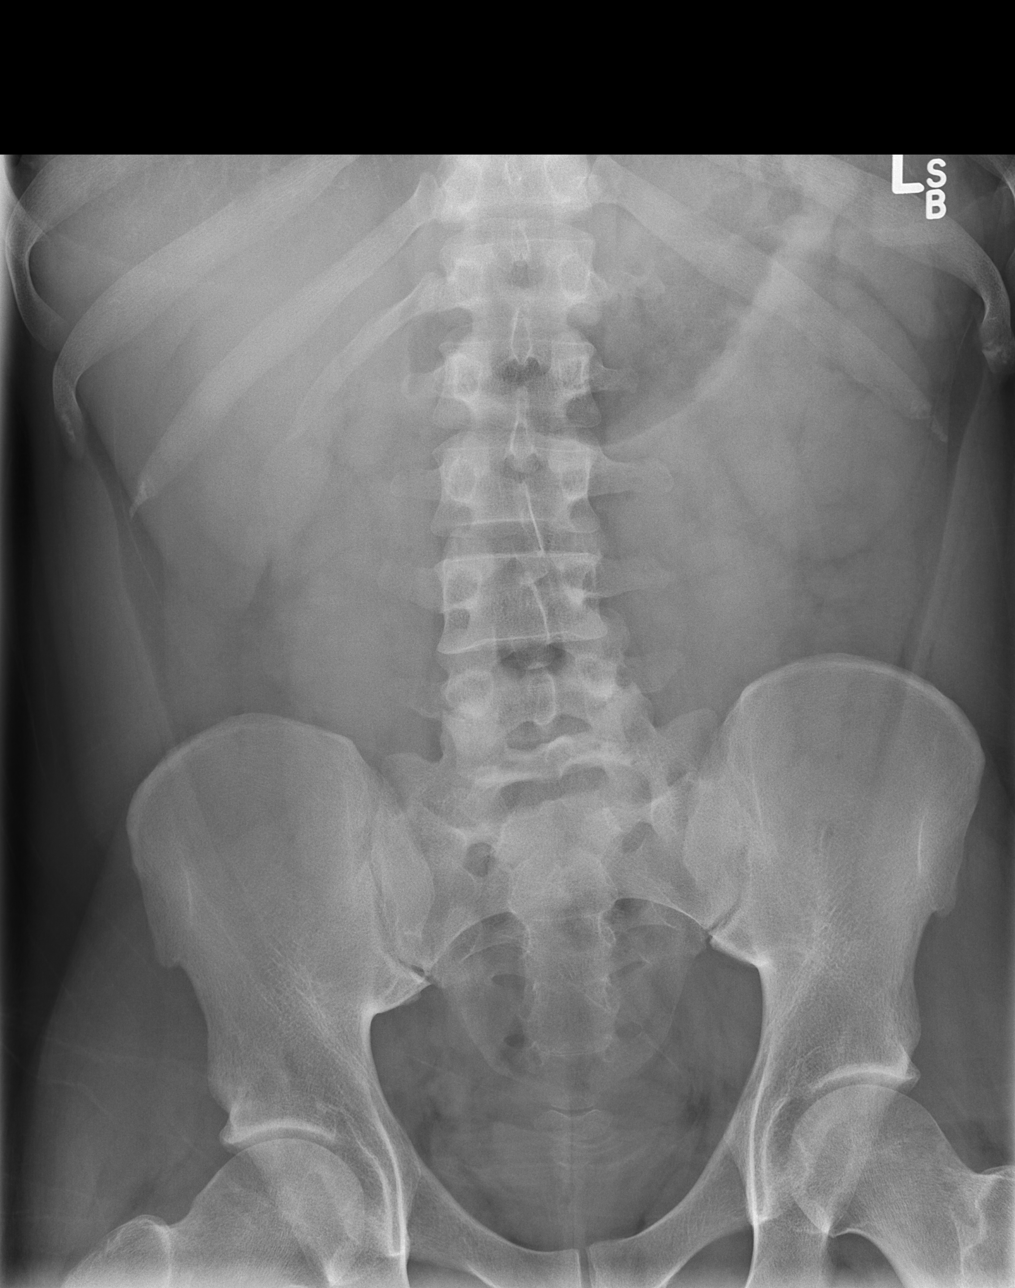

[3 of 3 positions shown; findings below may reference images not displayed]

FINDINGS: No active infiltrate or effusion is seen.  Mediastinal
contours appear normal.  The heart is within normal limits in size.

Supine and erect views of the abdomen show a paucity of bowel gas
throughout the abdomen.  There is some bowel gas within the stomach
which is not distended.  Fluid-filled bowel cannot be excluded.  No
free air is seen on the erect view.  No opaque calculi are noted.
IMPRESSION: 1.  No active lung disease.
2.  Paucity of bowel gas.  Cannot exclude fluid distended bowel.
No free air.

## 2014-12-06 ENCOUNTER — Emergency Department (HOSPITAL_COMMUNITY): Payer: Self-pay

## 2014-12-06 ENCOUNTER — Encounter (HOSPITAL_COMMUNITY): Payer: Self-pay

## 2014-12-06 ENCOUNTER — Emergency Department (HOSPITAL_COMMUNITY)
Admission: EM | Admit: 2014-12-06 | Discharge: 2014-12-06 | Disposition: A | Discharge status: Home / Self Care | Payer: Self-pay | Attending: Emergency Medicine | Admitting: Emergency Medicine

## 2014-12-06 DIAGNOSIS — K859 Acute pancreatitis without necrosis or infection, unspecified: Secondary | ICD-10-CM | POA: Insufficient documentation

## 2014-12-06 DIAGNOSIS — R1013 Epigastric pain: Secondary | ICD-10-CM

## 2014-12-06 DIAGNOSIS — R7989 Other specified abnormal findings of blood chemistry: Secondary | ICD-10-CM | POA: Insufficient documentation

## 2014-12-06 DIAGNOSIS — R945 Abnormal results of liver function studies: Secondary | ICD-10-CM

## 2014-12-06 DIAGNOSIS — K2970 Gastritis, unspecified, without bleeding: Secondary | ICD-10-CM | POA: Insufficient documentation

## 2014-12-06 DIAGNOSIS — R112 Nausea with vomiting, unspecified: Secondary | ICD-10-CM

## 2014-12-06 DIAGNOSIS — Z720 Tobacco use: Secondary | ICD-10-CM | POA: Insufficient documentation

## 2014-12-06 DIAGNOSIS — D72829 Elevated white blood cell count, unspecified: Secondary | ICD-10-CM | POA: Insufficient documentation

## 2014-12-06 LAB — COMPREHENSIVE METABOLIC PANEL
ALBUMIN: 4.8 g/dL (ref 3.5–5.0)
ALK PHOS: 89 U/L (ref 38–126)
ALT: 85 U/L — AB (ref 17–63)
AST: 51 U/L — ABNORMAL HIGH (ref 15–41)
Anion gap: 11 (ref 5–15)
BILIRUBIN TOTAL: 0.8 mg/dL (ref 0.3–1.2)
BUN: 15 mg/dL (ref 6–20)
CALCIUM: 10.3 mg/dL (ref 8.9–10.3)
CO2: 22 mmol/L (ref 22–32)
CREATININE: 1.05 mg/dL (ref 0.61–1.24)
Chloride: 107 mmol/L (ref 101–111)
GFR calc non Af Amer: >60 mL/min (ref >60)
GLUCOSE: 133 mg/dL — AB (ref 65–99)
Potassium: 4.1 mmol/L (ref 3.5–5.1)
SODIUM: 140 mmol/L (ref 135–145)
TOTAL PROTEIN: 8.8 g/dL — AB (ref 6.5–8.1)

## 2014-12-06 LAB — CBC WITH DIFFERENTIAL/PLATELET
Basophils Absolute: 0 10*3/uL (ref 0.0–0.1)
Basophils Relative: 0 % (ref 0–1)
EOS ABS: 0.2 10*3/uL (ref 0.0–0.7)
Eosinophils Relative: 1 % (ref 0–5)
HEMATOCRIT: 44.3 % (ref 39.0–52.0)
HEMOGLOBIN: 15.2 g/dL (ref 13.0–17.0)
LYMPHS ABS: 2.4 10*3/uL (ref 0.7–4.0)
Lymphocytes Relative: 16 % (ref 12–46)
MCH: 31.8 pg (ref 26.0–34.0)
MCHC: 34.3 g/dL (ref 30.0–36.0)
MCV: 92.7 fL (ref 78.0–100.0)
MONOS PCT: 6 % (ref 3–12)
Monocytes Absolute: 0.9 10*3/uL (ref 0.1–1.0)
NEUTROS ABS: 11.2 10*3/uL — AB (ref 1.7–7.7)
NEUTROS PCT: 77 % (ref 43–77)
Platelets: 215 10*3/uL (ref 150–400)
RBC: 4.78 MIL/uL (ref 4.22–5.81)
RDW: 13.3 % (ref 11.5–15.5)
WBC: 14.7 10*3/uL — AB (ref 4.0–10.5)

## 2014-12-06 LAB — URINALYSIS, ROUTINE W REFLEX MICROSCOPIC
Bilirubin Urine: NEGATIVE
GLUCOSE, UA: NEGATIVE mg/dL
HGB URINE DIPSTICK: NEGATIVE
Ketones, ur: NEGATIVE mg/dL
Leukocytes, UA: NEGATIVE
Nitrite: NEGATIVE
Protein, ur: NEGATIVE mg/dL
Urobilinogen, UA: 1 mg/dL (ref 0.0–1.0)
pH: 8 (ref 5.0–8.0)

## 2014-12-06 LAB — LIPASE, BLOOD: Lipase: 40 U/L (ref 22–51)

## 2014-12-06 LAB — I-STAT TROPONIN, ED: Troponin i, poc: 0 ng/mL (ref 0.00–0.08)

## 2014-12-06 MED ORDER — NAPROXEN 500 MG PO TABS: 500.0000 mg | ORAL_TABLET | Freq: Two times a day (BID) | ORAL | Status: DC | PRN

## 2014-12-06 MED ORDER — SODIUM CHLORIDE 0.9 % IV BOLUS (SEPSIS)
1000.0000 mL | Freq: Once | INTRAVENOUS | Status: AC
  Administered 2014-12-06: 1000 mL via INTRAVENOUS

## 2014-12-06 MED ORDER — RANITIDINE HCL 150 MG PO TABS: 150.0000 mg | ORAL_TABLET | Freq: Two times a day (BID) | ORAL | Status: DC

## 2014-12-06 MED ORDER — IOHEXOL 300 MG/ML  SOLN
100.0000 mL | Freq: Once | INTRAMUSCULAR | Status: AC | PRN
  Administered 2014-12-06: 100 mL via INTRAVENOUS

## 2014-12-06 MED ORDER — ONDANSETRON HCL 8 MG PO TABS: 8.0000 mg | ORAL_TABLET | Freq: Three times a day (TID) | ORAL | Status: DC | PRN

## 2014-12-06 MED ORDER — IOHEXOL 300 MG/ML  SOLN
25.0000 mL | Freq: Once | INTRAMUSCULAR | Status: AC | PRN
  Administered 2014-12-06: 25 mL via ORAL

## 2014-12-06 MED ORDER — HYDROMORPHONE HCL 1 MG/ML IJ SOLN
1.0000 mg | Freq: Once | INTRAMUSCULAR | Status: AC
  Administered 2014-12-06: 1 mg via INTRAVENOUS
  Filled 2014-12-06: qty 1

## 2014-12-06 MED ORDER — ONDANSETRON HCL 4 MG/2ML IJ SOLN
4.0000 mg | Freq: Once | INTRAMUSCULAR | Status: AC
  Administered 2014-12-06: 4 mg via INTRAVENOUS
  Filled 2014-12-06: qty 2

## 2014-12-06 MED ORDER — LORAZEPAM 2 MG/ML IJ SOLN
1.0000 mg | Freq: Once | INTRAMUSCULAR | Status: AC
  Administered 2014-12-06: 1 mg via INTRAVENOUS
  Filled 2014-12-06: qty 1

## 2014-12-06 MED ORDER — PANTOPRAZOLE SODIUM 40 MG IV SOLR
40.0000 mg | Freq: Once | INTRAVENOUS | Status: AC
  Administered 2014-12-06: 40 mg via INTRAVENOUS
  Filled 2014-12-06: qty 40

## 2014-12-06 NOTE — ED Notes (Signed)
"  Please push that [Dilaudid] closer to my IV!"  After leaving room, patient began to dry heave and eventually began to vomit again.

## 2014-12-06 NOTE — ED Notes (Signed)
Out to desk for the 3rd time stating, "I can't keep the contrast down, I'm really hurting. You need to give me more for pain."

## 2014-12-06 NOTE — ED Notes (Signed)
Pt continues to call out requesting more pain meds. "Maybe if next time you push it in closer to my IV it will actually work." "Its been 15 minutes, you don't understand, I need something else."

## 2014-12-06 NOTE — ED Provider Notes (Signed)
CSN: [id number]     Arrival date & time 12/06/14  1319 History   First MD Initiated Contact with Patient 12/06/14 1351     Chief Complaint  Patient presents with  . Abdominal Pain     (Consider location/radiation/quality/duration/timing/severity/associated sxs/prior Treatment) HPI Comments: Albert Peters is a 29 y.o. male with a PMHx of pancreatitis, who presents to the ED with complaints of epigastric and left upper quadrant pain that began suddenly around 11 AM. He reports the pain is 10/10 constant sharp nonradiating pain with no known aggravating factors and unrelieved with home Percocet due to having thrown this up. Associated symptoms include nausea and 20 episodes of nonbloody nonbilious emesis. States this feels like his prior pancreatitis episodes. Denies fevers, chills, CP, SOB, diarrhea, constipation, obstipation, hematemesis, flank pain, hematuria, dysuria, testicular pain or swelling, penile discharge, myalgias, arthralgias, numbness, tingling, weakness, alcohol use, sick contacts, recent travel, suspicious food intake, recent antibiotic use, chronic NSAID use, or prior abdominal surgeries. He admits to occasional marijuana use, last use was 3 days ago.   Patient is a 29 y.o. male presenting with abdominal pain. The history is provided by the patient. No language interpreter was used.  Abdominal Pain Pain location:  Epigastric and LUQ Pain quality: sharp   Pain radiates to:  Does not radiate Pain severity:  Severe Onset quality:  Sudden Duration:  3 hours Timing:  Constant Progression:  Unchanged Chronicity:  Recurrent Context: not alcohol use, not recent travel, not sick contacts and not suspicious food intake   Relieved by:  Nothing Worsened by:  Nothing tried Ineffective treatments: percocet- vomited up. Associated symptoms: nausea and vomiting   Associated symptoms: no chest pain, no chills, no constipation, no diarrhea, no dysuria, no fever, no flatus, no hematemesis, no  hematochezia, no hematuria, no melena and no shortness of breath   Risk factors: no alcohol abuse     Past Medical History  Diagnosis Date  . Pancreatitis    No past surgical history on file. History reviewed. No pertinent family history. Social History  Substance Use Topics  . Smoking status: Current Every Day Smoker  . Smokeless tobacco: None  . Alcohol Use: Yes     Comment: Former drinker    Review of Systems  Constitutional: Negative for fever and chills.  Respiratory: Negative for shortness of breath.   Cardiovascular: Negative for chest pain.  Gastrointestinal: Positive for nausea, vomiting and abdominal pain. Negative for diarrhea, constipation, blood in stool, melena, hematochezia, flatus and hematemesis.  Genitourinary: Negative for dysuria, hematuria, flank pain, discharge, scrotal swelling and testicular pain.  Musculoskeletal: Negative for myalgias and arthralgias.  Skin: Negative for color change.  Allergic/Immunologic: Negative for immunocompromised state.  Neurological: Negative for weakness and numbness.  Psychiatric/Behavioral: Negative for confusion.   10 Systems reviewed and are negative for acute change except as noted in the HPI.    Allergies  Review of patient's allergies indicates no known allergies.  Home Medications   Prior to Admission medications   Not on File   BP 150/100 mmHg  Pulse 80  Temp(Src) 98.5 F (36.9 C) (Oral)  Resp 20  Ht 6' 2"  (1.88 m)  Wt 225 lb (102.059 kg)  BMI 28.88 kg/m2  SpO2 100%   Physical Exam  Constitutional: He is oriented to person, place, and time. Vital signs are normal. He appears well-developed and well-nourished.  Non-toxic appearance. He appears distressed.  Afebrile, nontoxic, screaming in pain, very uncomfortable appearing, actively vomiting  HENT:  Head: Normocephalic  and atraumatic.  Mouth/Throat: Oropharynx is clear and moist and mucous membranes are normal.  Eyes: Conjunctivae and EOM are normal.  Right eye exhibits no discharge. Left eye exhibits no discharge.  Neck: Normal range of motion. Neck supple.  Cardiovascular: Normal rate, regular rhythm, normal heart sounds and intact distal pulses.  Exam reveals no gallop and no friction rub.   No murmur heard. Pulmonary/Chest: Effort normal and breath sounds normal. No respiratory distress. He has no decreased breath sounds. He has no wheezes. He has no rhonchi. He has no rales.  Abdominal: Soft. Normal appearance and bowel sounds are normal. He exhibits no distension. There is tenderness in the epigastric area and left upper quadrant. There is guarding. There is no rigidity, no rebound, no CVA tenderness, no tenderness at McBurney's point and negative Murphy's sign.    Soft, nondistended, +BS throughout, with LUQ/epigastric TTP, mild voluntary guarding, no rebound or rigidity, neg murphy's, neg mcburney's, no CVA TTP   Musculoskeletal: Normal range of motion.  Neurological: He is alert and oriented to person, place, and time. He has normal strength. No sensory deficit.  Skin: Skin is warm, dry and intact. No rash noted.  Psychiatric: He has a normal mood and affect.  Nursing note and vitals reviewed.   ED Course  Procedures (including critical care time) Labs Review Labs Reviewed  CBC WITH DIFFERENTIAL/PLATELET - Abnormal; Notable for the following:    WBC 14.7 (*)    Neutro Abs 11.2 (*)    All other components within normal limits  COMPREHENSIVE METABOLIC PANEL - Abnormal; Notable for the following:    Glucose, Bld 133 (*)    Total Protein 8.8 (*)    AST 51 (*)    ALT 85 (*)    All other components within normal limits  LIPASE, BLOOD  URINALYSIS, ROUTINE W REFLEX MICROSCOPIC (NOT AT [facility])  I-STAT TROPOININ, ED    Imaging Review Ct Abdomen Pelvis W Contrast  12/06/2014   CLINICAL DATA:  Sudden onset upper abdominal pain starting 3 hours ago. Nausea and vomiting. History of pancreatitis. Tenderness.  EXAM: CT ABDOMEN AND  PELVIS WITH CONTRAST  TECHNIQUE: Multidetector CT imaging of the abdomen and pelvis was performed using the standard protocol following bolus administration of intravenous contrast.  CONTRAST:  42m OMNIPAQUE IOHEXOL 300 MG/ML SOLN, 1073mOMNIPAQUE IOHEXOL 300 MG/ML SOLN  COMPARISON:  Prior abdominal CT and MRI exams on patient XaYovan Leemanmost recently 07/19/2014 abdominal CT.  FINDINGS: Lower chest:  Unremarkable  Hepatobiliary: 1.5 by 1.4 cm hemangioma in segment 4b of the liver, stable from multiple prior exams. Hypodense right hepatic lobe lesion is likewise stable and characterized as benign on prior workups.  Pancreas: Unremarkable  Spleen: Unremarkable  Adrenals/Urinary Tract: Unremarkable  Stomach/Bowel: Mild fatty deposition in the wall of the ascending, transverse, and descending colon. Questionable hyperemia in the colonic mesentery, borderline wall thickening.  Vascular/Lymphatic: Several right external iliac lymph nodes are mildly enlarged at 1.1 cm, as on image 72 series 2. Small bilateral inguinal lymph nodes are present.  Reproductive: Unremarkable  Other: No supplemental non-categorized findings.  Musculoskeletal: Central disc protrusion at L4-5. The L5 vertebra is transitional.  IMPRESSION: 1. Comparison is made with prior exams on patient XATowandaIt appears that the patient is currently registered with a misspelling of the surname. Medical record merger is in process. 2. There is some diffuse borderline colon wall thickening with fatty deposition along part of the colon wall, and some hyperemia of the colonic  mesentery -mild colitis is not excluded. 3. The pancreas has a normal CT appearance. Please note that early pancreatitis can be occult on CT scan. 4. Benign hepatic lesions favoring hemangiomas, previously worked up and chronically stable. 5. Mildly enlarged right external iliac lymph nodes may be reactive. These are slightly larger than on the 07/19/2014 CT abdomen exam.    Electronically Signed   By: Van Clines M.D.   On: 12/06/2014 15:29   I have personally reviewed and evaluated these images and lab results as part of my medical decision-making.   EKG Interpretation None      MDM   Final diagnoses:  Epigastric abdominal pain  Leukocytosis  Elevated LFTs  Nausea and vomiting in adult  Gastritis    29 y.o. male here with severe upper abd pain, n/v onset several hrs ago. Hx of pancreatitis. On exam, epigastric and LUQ TTP, mild guarding, pt screaming in pain, actively vomiting. Labs not yet obtained, will get these now, but will proceed with CT scan of abd since he has hx of pancreatitis and would like to eval for this vs pseudocyst vs other etiology for symptoms. Will get EKG and trop. Will give pain meds, nausea meds, and protonix. Will reassess shortly.   2:43 PM Trop neg. EKG with some early repol and movement artifact, no ischemic changes. CBC w/diff showing milely elevated WBC at 14.7 and abs neut 11.2 but remainder of differential unremarkable, could be stress response vs hemoconcentration. CMP with mildly elevated AST/ALT at 51/85 respectively, no elevation in alk phos, bili WNL. Lipase WNL. Could be very early pancreatitis vs atypical cholecystitis. If CT neg, will consider RUQ u/s although neg murphy's on exam. Pt walking the halls being disruptive, coming into EDP office to find me, stating his pain is still severe. Have already given 38m dilaudid and 125mativan. Will give 74m75milaudid again but discussed with pt that he cannot be walking around disrupting patients, or else he will risk being escorted out by security. Pt understands and agrees to this. Will reassess shortly.   3:39 PM CT abd/pelvis revealing that pt registered incorrectly and in fact is XAVR.R. Donnelleylso reveals some colonic wall thickening, could be mild colitis although pt without any diarrhea. Pancreas appears normal but still could be early pancreatitis. Called Dr.  LeiBurman Riisadiologist) to discuss if there were any gallbladder findings, he reports no pericholecystic fluid, no GB wall thickening, states it appears normal on CT. Pt again roaming the halls asking for pain meds, coming into EDP office. Swears that his name isn't Risko, but several nurses have seen pt before by this other name, doesn't have his ID with him. Begs for pain meds when I go in to discuss his results. At this point, he has been given plenty of chances and continues to disobey. He has been cleared medically and there is no emergent condition that he requires further management or eval. Doubt that U/A will change management. Will send home with zofran, zantac, and naprosyn. No narcotics. GI f/up in 1wk. I explained the diagnosis and have given explicit precautions to return to the ER including for any other new or worsening symptoms. The patient understands and accepts the medical plan as it's been dictated and I have answered their questions. Discharge instructions concerning home care and prescriptions have been given. The patient is STABLE and is discharged to home in good condition.  BP 150/100 mmHg  Pulse 80  Temp(Src) 98.5 F (36.9 C) (Oral)  Resp  20  Ht 6' 2"  (1.88 m)  Wt 225 lb (102.059 kg)  BMI 28.88 kg/m2  SpO2 100%  Meds ordered this encounter  Medications  . HYDROmorphone (DILAUDID) injection 1 mg    Sig:   . ondansetron (ZOFRAN) injection 4 mg    Sig:   . sodium chloride 0.9 % bolus 1,000 mL    Sig:   . pantoprazole (PROTONIX) injection 40 mg    Sig:   . LORazepam (ATIVAN) injection 1 mg    Sig:   . HYDROmorphone (DILAUDID) injection 1 mg    Sig:   . iohexol (OMNIPAQUE) 300 MG/ML solution 100 mL    Sig:   . iohexol (OMNIPAQUE) 300 MG/ML solution 25 mL    Sig:   . ondansetron (ZOFRAN) 8 MG tablet    Sig: Take 1 tablet (8 mg total) by mouth every 8 (eight) hours as needed for nausea or vomiting.    Dispense:  10 tablet    Refill:  0    Order Specific  Question:  Supervising Provider    Answer:  Sabra Heck, BRIAN [[id number]]  . naproxen (NAPROSYN) 500 MG tablet    Sig: Take 1 tablet (500 mg total) by mouth 2 (two) times daily as needed for mild pain, moderate pain or headache (TAKE WITH MEALS.).    Dispense:  20 tablet    Refill:  0    Order Specific Question:  Supervising Provider    Answer:  MILLER, BRIAN [[id number]]  . ranitidine (ZANTAC) 150 MG tablet    Sig: Take 1 tablet (150 mg total) by mouth 2 (two) times daily.    Dispense:  30 tablet    Refill:  0    Order Specific Question:  Supervising Provider    Answer:  Noemi Chapel [[id number]]     Albert Zeitlin Camprubi-Soms, PA-C 12/06/14 [place], MD 12/06/14 [phone]

## 2014-12-06 NOTE — ED Notes (Signed)
Patient transported to CT 

## 2014-12-06 NOTE — ED Notes (Signed)
Pt into Dr's office to find his provider and demand more pain meds. Security and Paulina PA escorted patient to beside.

## 2014-12-06 NOTE — Discharge Instructions (Signed)
Your abdominal pain is likely from gastritis or an ulcer. You will need to take zantac as directed, and avoid spicy/fatty/acidic foods. Avoid laying down flat within 30 minutes of eating. Avoid NSAIDs like ibuprofen on an empty stomach. Use zofran as needed for nausea. Use naprosyn or tylenol as needed for pain but don't take these on an empty stomach. Follow up with the gastroenterologist in one week for ongoing evaluation of your abdominal pain. Return to the ER for changes or worsening symptoms.  Abdominal (belly) pain can be caused by many things. Your caregiver performed an examination and possibly ordered blood/urine tests and imaging (CT scan, x-rays, ultrasound). Many cases can be observed and treated at home after initial evaluation in the emergency department. Even though you are being discharged home, abdominal pain can be unpredictable. Therefore, you need a repeated exam if your pain does not resolve, returns, or worsens. Most patients with abdominal pain don't have to be admitted to the hospital or have surgery, but serious problems like appendicitis and gallbladder attacks can start out as nonspecific pain. Many abdominal conditions cannot be diagnosed in one visit, so follow-up evaluations are very important. SEEK IMMEDIATE MEDICAL ATTENTION IF YOU DEVELOP ANY OF THE FOLLOWING SYMPTOMS:  The pain does not go away or becomes severe.   A temperature above 101 develops.   Repeated vomiting occurs (multiple episodes).   The pain becomes localized to portions of the abdomen. The right side could possibly be appendicitis. In an adult, the left lower portion of the abdomen could be colitis or diverticulitis.   Blood is being passed in stools or vomit (bright red or black tarry stools).   Return also if you develop chest pain, difficulty breathing, dizziness or fainting, or become confused, poorly responsive, or inconsolable (young children).  The constipation stays for more than 4 days.    There is belly (abdominal) or rectal pain.   You do not seem to be getting better.      Abdominal Pain Many things can cause belly (abdominal) pain. Most times, the belly pain is not dangerous. Many cases of belly pain can be watched and treated at home. HOME CARE   Do not take medicines that help you go poop (laxatives) unless told to by your doctor.  Only take medicine as told by your doctor.  Eat or drink as told by your doctor. Your doctor will tell you if you should be on a special diet. GET HELP IF:  You do not know what is causing your belly pain.  You have belly pain while you are sick to your stomach (nauseous) or have runny poop (diarrhea).  You have pain while you pee or poop.  Your belly pain wakes you up at night.  You have belly pain that gets worse or better when you eat.  You have belly pain that gets worse when you eat fatty foods.  You have a fever. GET HELP RIGHT AWAY IF:   The pain does not go away within 2 hours.  You keep throwing up (vomiting).  The pain changes and is only in the right or left part of the belly.  You have bloody or tarry looking poop. MAKE SURE YOU:   Understand these instructions.  Will watch your condition.  Will get help right away if you are not doing well or get worse. Document Released: 09/06/2007 Document Revised: 03/25/2013 Document Reviewed: 11/27/2012 [facility] Patient Information 2015 [place], [place]. This information is not intended to replace advice given  to you by your health care provider. Make sure you discuss any questions you have with your health care provider.  Gastritis, Adult Gastritis is soreness and puffiness (inflammation) of the lining of the stomach. If you do not get help, gastritis can cause bleeding and sores (ulcers) in the stomach. HOME CARE   Only take medicine as told by your doctor.  If you were given antibiotic medicines, take them as told. Finish the medicines even if you start to  feel better.  Drink enough fluids to keep your pee (urine) clear or pale yellow.  Avoid foods and drinks that make your problems worse. Foods you may want to avoid include:  Caffeine or alcohol.  Chocolate.  Mint.  Garlic and onions.  Spicy foods.  Citrus fruits, including oranges, lemons, or limes.  Food containing tomatoes, including sauce, chili, salsa, and pizza.  Fried and fatty foods.  Eat small meals throughout the day instead of large meals. GET HELP RIGHT AWAY IF:   You have black or dark red poop (stools).  You throw up (vomit) blood. It may look like coffee grounds.  You cannot keep fluids down.  Your belly (abdominal) pain gets worse.  You have a fever.  You do not feel better after 1 week.  You have any other questions or concerns. MAKE SURE YOU:   Understand these instructions.  Will watch your condition.  Will get help right away if you are not doing well or get worse. Document Released: 09/06/2007 Document Revised: 06/12/2011 Document Reviewed: 05/03/2011 [facility] Patient Information 2015 [place], [place]. This information is not intended to replace advice given to you by your health care provider. Make sure you discuss any questions you have with your health care provider.  Nausea and Vomiting Nausea is a sick feeling that often comes before throwing up (vomiting). Vomiting is a reflex where stomach contents come out of your mouth. Vomiting can cause severe loss of body fluids (dehydration). Children and elderly adults can become dehydrated quickly, especially if they also have diarrhea. Nausea and vomiting are symptoms of a condition or disease. It is important to find the cause of your symptoms. CAUSES   Direct irritation of the stomach lining. This irritation can result from increased acid production (gastroesophageal reflux disease), infection, food poisoning, taking certain medicines (such as nonsteroidal anti-inflammatory drugs), alcohol use,  or tobacco use.  Signals from the brain.These signals could be caused by a headache, heat exposure, an inner ear disturbance, increased pressure in the brain from injury, infection, a tumor, or a concussion, pain, emotional stimulus, or metabolic problems.  An obstruction in the gastrointestinal tract (bowel obstruction).  Illnesses such as diabetes, hepatitis, gallbladder problems, appendicitis, kidney problems, cancer, sepsis, atypical symptoms of a heart attack, or eating disorders.  Medical treatments such as chemotherapy and radiation.  Receiving medicine that makes you sleep (general anesthetic) during surgery. DIAGNOSIS Your caregiver may ask for tests to be done if the problems do not improve after a few days. Tests may also be done if symptoms are severe or if the reason for the nausea and vomiting is not clear. Tests may include:  Urine tests.  Blood tests.  Stool tests.  Cultures (to look for evidence of infection).  X-rays or other imaging studies. Test results can help your caregiver make decisions about treatment or the need for additional tests. TREATMENT You need to stay well hydrated. Drink frequently but in small amounts.You may wish to drink water, sports drinks, clear broth, or eat  frozen ice pops or gelatin dessert to help stay hydrated.When you eat, eating slowly may help prevent nausea.There are also some antinausea medicines that may help prevent nausea. HOME CARE INSTRUCTIONS   Take all medicine as directed by your caregiver.  If you do not have an appetite, do not force yourself to eat. However, you must continue to drink fluids.  If you have an appetite, eat a normal diet unless your caregiver tells you differently.  Eat a variety of complex carbohydrates (rice, wheat, potatoes, bread), lean meats, yogurt, fruits, and vegetables.  Avoid high-fat foods because they are more difficult to digest.  Drink enough water and fluids to keep your urine clear  or pale yellow.  If you are dehydrated, ask your caregiver for specific rehydration instructions. Signs of dehydration may include:  Severe thirst.  Dry lips and mouth.  Dizziness.  Dark urine.  Decreasing urine frequency and amount.  Confusion.  Rapid breathing or pulse. SEEK IMMEDIATE MEDICAL CARE IF:   You have blood or brown flecks (like coffee grounds) in your vomit.  You have black or bloody stools.  You have a severe headache or stiff neck.  You are confused.  You have severe abdominal pain.  You have chest pain or trouble breathing.  You do not urinate at least once every 8 hours.  You develop cold or clammy skin.  You continue to vomit for longer than 24 to 48 hours.  You have a fever. MAKE SURE YOU:   Understand these instructions.  Will watch your condition.  Will get help right away if you are not doing well or get worse. Document Released: 03/20/2005 Document Revised: 06/12/2011 Document Reviewed: 08/17/2010 [facility] Patient Information 2015 [place], [place]. This information is not intended to replace advice given to you by your health care provider. Make sure you discuss any questions you have with your health care provider.

## 2014-12-06 NOTE — ED Notes (Addendum)
At discharge continuing to ask for more Diluadid and something for his nausea.   Escorted off hospital property by this RN, [employer] officer and 2 hospital police officers.   Patient had falsified his name, refused to present ID to [place] when asked.

## 2014-12-06 NOTE — ED Notes (Signed)
He c/o sudden onset of upper abd. Pain about three hours ago which persists.  He also c/o n/v and states he has hx of pancreatitis.  He is tearful as if in much pain.

## 2014-12-06 NOTE — ED Notes (Signed)
Out to desk, redirected back to room. Pt unwilling to stay in room. Continues to moan and yell, "Oh my God" Demanding more pain meds. Mercedes PA aware.

## 2014-12-06 NOTE — ED Notes (Signed)
Patient out to desk carrying IV fluids and emesis bag, moaning and shouting. Demanding more Dilaudid.

## 2014-12-07 ENCOUNTER — Encounter: Payer: Self-pay | Admitting: Emergency Medicine

## 2016-06-10 ENCOUNTER — Emergency Department
Admission: EM | Admit: 2016-06-10 | Discharge: 2016-06-11 | Disposition: A | Discharge status: Home / Self Care | Payer: Self-pay | Attending: Emergency Medicine | Admitting: Emergency Medicine

## 2016-06-10 ENCOUNTER — Encounter: Payer: Self-pay | Admitting: Emergency Medicine

## 2016-06-10 DIAGNOSIS — Z7289 Other problems related to lifestyle: Secondary | ICD-10-CM | POA: Insufficient documentation

## 2016-06-10 DIAGNOSIS — K5100 Ulcerative (chronic) pancolitis without complications: Secondary | ICD-10-CM | POA: Insufficient documentation

## 2016-06-10 DIAGNOSIS — K2900 Acute gastritis without bleeding: Secondary | ICD-10-CM | POA: Insufficient documentation

## 2016-06-10 DIAGNOSIS — R1010 Upper abdominal pain, unspecified: Secondary | ICD-10-CM

## 2016-06-10 DIAGNOSIS — Z87891 Personal history of nicotine dependence: Secondary | ICD-10-CM | POA: Insufficient documentation

## 2016-06-10 DIAGNOSIS — Z765 Malingerer [conscious simulation]: Secondary | ICD-10-CM

## 2016-06-10 LAB — COMPREHENSIVE METABOLIC PANEL
ALBUMIN: 4.6 g/dL (ref 3.5–5.0)
ALK PHOS: 87 U/L (ref 38–126)
ALT: 73 U/L — AB (ref 17–63)
AST: 40 U/L (ref 15–41)
Anion gap: 12 (ref 5–15)
BILIRUBIN TOTAL: 0.4 mg/dL (ref 0.3–1.2)
BUN: 16 mg/dL (ref 6–20)
CALCIUM: 9.9 mg/dL (ref 8.9–10.3)
CO2: 23 mmol/L (ref 22–32)
CREATININE: 0.82 mg/dL (ref 0.61–1.24)
Chloride: 104 mmol/L (ref 101–111)
GFR calc Af Amer: >60 mL/min (ref >60)
GFR calc non Af Amer: >60 mL/min (ref >60)
GLUCOSE: 132 mg/dL — AB (ref 65–99)
POTASSIUM: 3.7 mmol/L (ref 3.5–5.1)
Sodium: 139 mmol/L (ref 135–145)
TOTAL PROTEIN: 9.2 g/dL — AB (ref 6.5–8.1)

## 2016-06-10 LAB — LIPASE, BLOOD: Lipase: <10 U/L — ABNORMAL LOW (ref 11–51)

## 2016-06-10 LAB — CBC
HCT: 46.2 % (ref 40.0–52.0)
HEMOGLOBIN: 15.8 g/dL (ref 13.0–18.0)
MCH: 31.5 pg (ref 26.0–34.0)
MCHC: 34.3 g/dL (ref 32.0–36.0)
MCV: 91.9 fL (ref 80.0–100.0)
Platelets: 241 10*3/uL (ref 150–440)
RBC: 5.02 MIL/uL (ref 4.40–5.90)
RDW: 13.1 % (ref 11.5–14.5)
WBC: 21.8 10*3/uL — ABNORMAL HIGH (ref 3.8–10.6)

## 2016-06-10 LAB — TROPONIN I: Troponin I: <0.03 ng/mL (ref <0.03)

## 2016-06-10 MED ORDER — ONDANSETRON 4 MG PO TBDP
ORAL_TABLET | ORAL | Status: AC
  Filled 2016-06-10: qty 1

## 2016-06-10 MED ORDER — HALOPERIDOL LACTATE 5 MG/ML IJ SOLN
2.5000 mg | Freq: Once | INTRAMUSCULAR | Status: AC
  Administered 2016-06-11: 2.5 mg via INTRAVENOUS

## 2016-06-10 MED ORDER — ONDANSETRON 4 MG PO TBDP
4.0000 mg | ORAL_TABLET | Freq: Once | ORAL | Status: AC
  Administered 2016-06-10: 4 mg via ORAL

## 2016-06-10 NOTE — ED Notes (Addendum)
Pt waiting in triage room for treatment room; tearful and moaning; lab called to say pt needs redraw of lavender top tube

## 2016-06-10 NOTE — ED Notes (Signed)
Pt up drinking out of sink in room. md notified.

## 2016-06-10 NOTE — ED Notes (Signed)
Pt has asked this RN x5 for pain medication and has been informed each time that an MD must evaluate him and put that order in.

## 2016-06-10 NOTE — ED Notes (Signed)
Phone report given to April, RN

## 2016-06-10 NOTE — ED Notes (Addendum)
Pt sticking finger down his throat in an attempt to vomit. Pt asking "when is the doctor comin', I need medicine, nothin' else matters." pt updated on delay. Pt declining to provide any other information for rn for assessment.

## 2016-06-10 NOTE — ED Notes (Signed)
lav tube sent to lab spoke with Murray Hodgkins verified it was ok to put a save label on said tube

## 2016-06-10 NOTE — ED Notes (Signed)
Pt states that he is unable to urinate at this time because he just peed before he left home.

## 2016-06-10 NOTE — ED Notes (Signed)
Pt noted to be at sink in triage drinking water quickly from faucet; asked pt to refrain from liquids due to vomiting; pt verbalized understanding;

## 2016-06-10 NOTE — ED Triage Notes (Signed)
Pt arrives via wheelchair and was dropped off to ER with c/o vomiting x3 hours. Pt states that he has a hx of pancreatitis and gastritis. Pt is pale and moaning in triage.

## 2016-06-10 NOTE — ED Notes (Signed)
md in to see pt.  

## 2016-06-10 NOTE — ED Notes (Signed)
Pt up to stat registration saying his chest hurts; EKG added; will add Troponin to labs already ordered;

## 2016-06-11 ENCOUNTER — Emergency Department: Payer: Self-pay

## 2016-06-11 ENCOUNTER — Encounter: Payer: Self-pay | Admitting: Radiology

## 2016-06-11 MED ORDER — CIPROFLOXACIN HCL 500 MG PO TABS: 500.0000 mg | ORAL_TABLET | Freq: Two times a day (BID) | ORAL | 0 refills | Status: DC

## 2016-06-11 MED ORDER — GI COCKTAIL ~~LOC~~
30.0000 mL | ORAL | Status: AC
  Administered 2016-06-11: 30 mL via ORAL

## 2016-06-11 MED ORDER — FAMOTIDINE 20 MG PO TABS: 20.0000 mg | ORAL_TABLET | Freq: Two times a day (BID) | ORAL | 0 refills | Status: DC

## 2016-06-11 MED ORDER — IOPAMIDOL (ISOVUE-300) INJECTION 61%
100.0000 mL | Freq: Once | INTRAVENOUS | Status: AC | PRN
  Administered 2016-06-11: 100 mL via INTRAVENOUS

## 2016-06-11 MED ORDER — METRONIDAZOLE 500 MG PO TABS: 500.0000 mg | ORAL_TABLET | Freq: Three times a day (TID) | ORAL | 0 refills | Status: DC

## 2016-06-11 MED ORDER — FAMOTIDINE 20 MG PO TABS
40.0000 mg | ORAL_TABLET | Freq: Once | ORAL | Status: AC
  Administered 2016-06-11: 40 mg via ORAL

## 2016-06-11 NOTE — ED Notes (Signed)
Pt out of room with belongings and iv in place. Pt states "i just want to walk around". Pt informed he must wait in room on bed and is not to walk around after iv haldol.

## 2016-06-11 NOTE — ED Notes (Signed)
Patient transported to CT 

## 2016-06-11 NOTE — ED Notes (Signed)
Pt resting, returned from ct scan.

## 2016-06-11 NOTE — ED Notes (Addendum)
Attempted to discuss discharge instructions and follow up care with pt. Pt states 'you sendin' me home like this". Again attempted to discuss follow up care with pt. Pt has put jacket on and is declining to remove for blood pressure check. Pt states "you sendin' me out like this shit, I mean I ain't doin' it". Pt will not allow rn to obtain discharge vital signs.

## 2016-06-11 NOTE — Discharge Instructions (Signed)
Your CT scan shows inflammation of your large intestine.  Follow up with primary care and gastroenterology for further evaluation of this finding.  Take antibiotics (cipro and flagyl) to calm the inflammation.

## 2016-06-11 NOTE — ED Provider Notes (Addendum)
[facility] Emergency Department Provider Note  ____________________________________________  Time seen: Approximately 1:18 AM  I have reviewed the triage vital signs and the nursing notes.   HISTORY  Chief Complaint Emesis    HPI Albert Peters is a 30 y.o. male who complains of upper abdominal pain and vomiting that started at 10 PM tonight. Last ate at 2 PM when he had a grilled chicken salad from [employer]. He also drinks daily and uses marijuana frequently. Has recurrent episodes of similar symptoms. He states the Dilaudid and Phenergan and Pepcid and GI cocktail helped his symptoms in the past.Denies any trauma. No fever. No diarrhea or blood in vomit.  Has been observed drinking water out of the sink and placing his fingers in his mouth in apparent attempt to induce vomiting. This is typical behavior for him based on multiple prior ED visits.     Past Medical History:  Diagnosis Date  . Gastritis   . Hepatitis C   . Hepatitis C   . Pancreatitis      Patient Active Problem List   Diagnosis Date Noted  . Pancreatitis   . Elevated LFTs 06/11/2012  . Abdominal pain 06/11/2012  . History of pancreatitis 06/11/2012  . Gastritis 06/11/2012  . Tobacco abuse 06/11/2012  . Cannabis abuse 06/11/2012     History reviewed. No pertinent surgical history.   Prior to Admission medications   Medication Sig Start Date End Date Taking? Authorizing Provider  ciprofloxacin (CIPRO) 500 MG tablet Take 1 tablet (500 mg total) by mouth 2 (two) times daily. 06/11/16   Carrie Mew, MD  dicyclomine (BENTYL) 20 MG tablet Take 20 mg by mouth 2 (two) times daily. 02/03/14 02/03/15  Historical Provider, MD  famotidine (PEPCID) 20 MG tablet Take 20 mg by mouth 2 (two) times daily.    Historical Provider, MD  famotidine (PEPCID) 20 MG tablet Take 1 tablet (20 mg total) by mouth 2 (two) times daily. 06/11/16   Carrie Mew, MD  ketorolac (TORADOL) 10 MG tablet  Take 1 tablet (10 mg total) by mouth every 8 (eight) hours as needed for moderate pain. 10/06/14   Gregor Hams, MD  metroNIDAZOLE (FLAGYL) 500 MG tablet Take 1 tablet (500 mg total) by mouth 3 (three) times daily. 06/11/16   Carrie Mew, MD  naproxen (NAPROSYN) 500 MG tablet Take 1 tablet (500 mg total) by mouth 2 (two) times daily as needed for mild pain, moderate pain or headache (TAKE WITH MEALS.). 12/06/14   Mercedes Street, PA-C  omeprazole (PRILOSEC) 20 MG capsule Take 20 mg by mouth 2 (two) times daily.    Historical Provider, MD  ondansetron (ZOFRAN) 4 MG tablet Take 4 mg by mouth as needed. Nausea/vomiting    Historical Provider, MD  ondansetron (ZOFRAN) 8 MG tablet Take 1 tablet (8 mg total) by mouth every 8 (eight) hours as needed for nausea or vomiting. 12/06/14   Mercedes Street, PA-C  Oxycodone HCl 10 MG TABS Take 5-10 mg by mouth every 4 (four) hours as needed (nausea/vomiting).    Historical Provider, MD  potassium chloride (K-DUR) 10 MEQ tablet Take 2 tablets (20 mEq total) by mouth daily. 08/17/14   Lavonia Drafts, MD  prochlorperazine (COMPAZINE) 10 MG tablet Take 1 tablet (10 mg total) by mouth every 6 (six) hours as needed for nausea. 08/13/14 08/13/15  Ahmed Prima, MD  prochlorperazine (COMPAZINE) 25 MG suppository Place 1 suppository (25 mg total) rectally every 12 (twelve) hours as needed for  nausea. 08/13/14 08/13/15  Ahmed Prima, MD  promethazine (PHENERGAN) 25 MG tablet Take 25 mg by mouth every 6 (six) hours as needed. Nausea/ up to 20 doses 03/29/14   Historical Provider, MD  promethazine (PHENERGAN) 25 MG tablet Take 1 tablet (25 mg total) by mouth every 6 (six) hours as needed for nausea or vomiting. 03/31/14   Evelina Bucy, MD  ranitidine (ZANTAC) 150 MG tablet Take 1 tablet (150 mg total) by mouth 2 (two) times daily. 12/06/14   Mercedes Street, PA-C     Allergies Penicillin g; Shellfish allergy; and Sulfa antibiotics   Family History  Problem Relation Age  of Onset  . CAD Father     Social History Social History  Substance Use Topics  . Smoking status: Former Research scientist (life sciences)  . Smokeless tobacco: Never Used  . Alcohol use Yes     Comment: Former drinker    Review of Systems  Constitutional:   No fever or chills.  ENT:   No sore throat. No rhinorrhea. Cardiovascular:   No chest pain. Respiratory:   No dyspnea or cough. Gastrointestinal:   Abdominal pain with vomiting as above.  Genitourinary:   Negative for dysuria or difficulty urinating. Musculoskeletal:   Negative for focal pain or swelling Neurological:   Negative for headaches 10-point ROS otherwise negative.  ____________________________________________   PHYSICAL EXAM:  VITAL SIGNS: ED Triage Vitals  Enc Vitals Group     BP 06/10/16 2237 (!) 144/99     Pulse Rate 06/10/16 2237 80     Resp 06/10/16 2237 18     Temp 06/10/16 2237 98.1 F (36.7 C)     Temp Source 06/10/16 2237 Oral     SpO2 06/10/16 2237 100 %     Weight 06/10/16 2238 205 lb (93 kg)     Height 06/10/16 2238 6\' 2"  (1.88 m)     Head Circumference --      Peak Flow --      Pain Score 06/10/16 2238 10     Pain Loc --      Pain Edu? --      Excl. in [place]? --     Vital signs reviewed, nursing assessments reviewed.   Constitutional:   Alert and oriented. Well appearing and in no distress. Eyes:   No scleral icterus. No conjunctival pallor. PERRL. EOMI.  No nystagmus. ENT   Head:   Normocephalic and atraumatic.   Nose:   No congestion/rhinnorhea. No septal hematoma   Mouth/Throat:   MMM, no pharyngeal erythema. No peritonsillar mass.    Neck:   No stridor. No SubQ emphysema. No meningismus. Hematological/Lymphatic/Immunilogical:   No cervical lymphadenopathy. Cardiovascular:   RRR. Symmetric bilateral radial and DP pulses.  No murmurs.  Respiratory:   Normal respiratory effort without tachypnea nor retractions. Breath sounds are clear and equal bilaterally. No  wheezes/rales/rhonchi. Gastrointestinal:   Soft With diffuse upper abdominal tenderness. Non distended. There is no CVA tenderness.  No rebound, rigidity, or guarding. Genitourinary:   deferred Musculoskeletal:   Normal range of motion in all extremities. No joint effusions.  No lower extremity tenderness.  No edema. Neurologic:   Normal speech and language.  CN 2-10 normal. Motor grossly intact. No gross focal neurologic deficits are appreciated.  Skin:    Skin is warm, dry and intact. No rash noted.  No petechiae, purpura, or bullae.  ____________________________________________    LABS (pertinent positives/negatives) (all labs ordered are listed, but only abnormal results are displayed) Labs Reviewed  LIPASE, BLOOD - Abnormal; Notable for the following:       Result Value   Lipase <10 (*)    All other components within normal limits  COMPREHENSIVE METABOLIC PANEL - Abnormal; Notable for the following:    Glucose, Bld 132 (*)    Total Protein 9.2 (*)    ALT 73 (*)    All other components within normal limits  CBC - Abnormal; Notable for the following:    WBC 21.8 (*)    All other components within normal limits  TROPONIN I  URINALYSIS, COMPLETE (UACMP) WITH MICROSCOPIC   ____________________________________________   EKG  Interpreted by me  Date: 06/11/2016  Rate: 76  Rhythm: normal sinus rhythm  QRS Axis: normal  Intervals: normal  ST/T Wave abnormalities: normal  Conduction Disutrbances: none  Narrative Interpretation: unremarkable      ____________________________________________    RADIOLOGY  Ct Abdomen Pelvis W Contrast  Result Date: 06/11/2016 CLINICAL DATA:  Vomiting x3 hours. History of pancreatitis gastritis. EXAM: CT ABDOMEN AND PELVIS WITH CONTRAST TECHNIQUE: Multidetector CT imaging of the abdomen and pelvis was performed using the standard protocol following bolus administration of intravenous contrast. CONTRAST:  146mL ISOVUE-300 IOPAMIDOL  (ISOVUE-300) INJECTION 61% COMPARISON:  12/06/2014 CT and priors FINDINGS: Lower chest: No acute abnormality. Hepatobiliary: 13 mm hypodensity consistent with a hemangioma on multiple prior CT exams. No biliary dilatation. Gallbladder is nondistended. Pancreas: Mild ectasia up to 4 mm of the pancreatic duct without space-occupying mass. No peripancreatic inflammation. Spleen: Normal Adrenals/Urinary Tract: Adrenal glands are unremarkable. Kidneys are normal, without renal calculi, focal lesion, or hydronephrosis. Bladder is unremarkable. Stomach/Bowel: Contracted stomach. Normal small bowel rotation without obstruction. Diffuse transmural thickening of large bowel from cecum through sigmoid consistent with pancolitis. The appendix is not visualized. Vascular/Lymphatic: No significant vascular findings are present. No enlarged abdominal or pelvic lymph nodes. Subcentimeter short axis external iliac chain lymph nodes bilaterally. Reproductive: Prostate is unremarkable. Other: No abdominal wall hernia or abnormality. No abdominopelvic ascites. Musculoskeletal: Mild dextroconvex curvature of the lumbar spine possibly positional. Mild central disc bulges L4-5 and L5-S1. IMPRESSION: 1. Findings suggest a pancolitis possibly postinflammatory or postinfectious. No bowel obstruction. 2. Stable 13 mm right hepatic hypodensity consistent with a hemangioma multiple prior studies. 3. Mild ectasia of the pancreatic duct without space-occupying lesions noted nor calculus identified. Electronically Signed   By: Ashley Royalty M.D.   On: 06/11/2016 01:19    ____________________________________________   PROCEDURES Procedures  ____________________________________________   INITIAL IMPRESSION / ASSESSMENT AND PLAN / ED COURSE  Pertinent labs & imaging results that were available during my care of the patient were reviewed by me and considered in my medical decision making (see chart for details).  Patient presents with  abdominal pain and vomiting, consistent with multiple prior ED presentations. He's had multiple CT scans in the past, last about 18 months ago for the same. He is frequently requesting narcotics and exhibiting drug-seeking behavior. White blood cell count is 21,000, increased from a baseline of 15,000. This is most likely due to pain and vomiting, but due to the unreliability of his history and exam, I ordered a CT scan. If unremarkable, I think the patient is suitable for outpatient follow-up given his otherwise unremarkable vital signs and nontoxic well appearance. Patient given Haldol for its antiemetic effects     Clinical Course as of Jun 12 123  Sat Jun 10, 2016  2336 Pt is tolerating PO.  [PS]  Sun Jun 11, 2016  0010  Pt attempted to elope with IV in place.  Continues to demand dilaudid and phenergan.  Advised that if he does not stay in room he will be discharged.   [PS]    Clinical Course User Index [PS] Carrie Mew, MD   CT suggest pancolitis. Does not require hospitalization at this time, but I will start the patient on Cipro and Flagyl as well as continuing an antacid therapy. Follow-up with primary care and gastroenterology.  ____________________________________________   FINAL CLINICAL IMPRESSION(S) / ED DIAGNOSES  Final diagnoses:  Pain of upper abdomen  Acute gastritis, presence of bleeding unspecified, unspecified gastritis type  Drug-seeking behavior  Pancolitis (HCC)      New Prescriptions   CIPROFLOXACIN (CIPRO) 500 MG TABLET    Take 1 tablet (500 mg total) by mouth 2 (two) times daily.   FAMOTIDINE (PEPCID) 20 MG TABLET    Take 1 tablet (20 mg total) by mouth 2 (two) times daily.   METRONIDAZOLE (FLAGYL) 500 MG TABLET    Take 1 tablet (500 mg total) by mouth 3 (three) times daily.     Portions of this note were generated with dragon dictation software. Dictation errors may occur despite best attempts at proofreading.    Carrie Mew, MD 06/11/16  0125    Carrie Mew, MD 06/11/16 [phone]

## 2016-06-11 NOTE — ED Notes (Signed)
Pt out of room again, states 'this shit ain't workin'" pt informed received medication less than 4 minutes ago and needs to give it time to work. md in to speak with pt.

## 2016-06-11 NOTE — ED Notes (Signed)
Pt verbalizes understanding that he is not to drive for 4-8 hours after administration of iv haldol. Pt states he will be able to get a ride home and verbalizes understanding of instructions not to drive.

## 2017-12-24 ENCOUNTER — Other Ambulatory Visit: Payer: Self-pay

## 2017-12-24 ENCOUNTER — Emergency Department
Admission: EM | Admit: 2017-12-24 | Discharge: 2017-12-24 | Disposition: A | Discharge status: Home / Self Care | Payer: Self-pay | Attending: Emergency Medicine | Admitting: Emergency Medicine

## 2017-12-24 ENCOUNTER — Emergency Department: Payer: Self-pay

## 2017-12-24 DIAGNOSIS — F17210 Nicotine dependence, cigarettes, uncomplicated: Secondary | ICD-10-CM | POA: Insufficient documentation

## 2017-12-24 DIAGNOSIS — Z79899 Other long term (current) drug therapy: Secondary | ICD-10-CM | POA: Insufficient documentation

## 2017-12-24 DIAGNOSIS — M79641 Pain in right hand: Secondary | ICD-10-CM | POA: Insufficient documentation

## 2017-12-24 MED ORDER — NAPROXEN 500 MG PO TABS: 500.0000 mg | ORAL_TABLET | Freq: Two times a day (BID) | ORAL | Status: DC

## 2017-12-24 NOTE — ED Notes (Signed)
Pt verbalizes understanding of d/c instructions and follow up. 

## 2017-12-24 NOTE — ED Provider Notes (Signed)
[facility] Emergency Department Provider Note   ____________________________________________   First MD Initiated Contact with Patient 12/24/17 [phone]     (approximate)  I have reviewed the triage vital signs and the nursing notes.   HISTORY  Chief Complaint Hand Pain    HPI Albert Peters is a 32 y.o. male patient complain of right hand pain for 1 week.  Patient said he "jammed" his right hand in a door last week.  Patient also states there is a history of a hand fracture in the past which was not seen or treated by orthopedics.  Patient had pain increases with gripping.  Patient denies loss of sensation.  Patient unable to perform work today secondary to pain.  Sent by his employer for evaluation.  Patient rates pain a 7/10.  Patient described pain is "achy".  Patient is right-hand dominant.  Past Medical History:  Diagnosis Date  . Gastritis   . Hepatitis C   . Hepatitis C   . Pancreatitis     Patient Active Problem List   Diagnosis Date Noted  . Pancreatitis   . Elevated LFTs 06/11/2012  . Abdominal pain 06/11/2012  . History of pancreatitis 06/11/2012  . Gastritis 06/11/2012  . Tobacco abuse 06/11/2012  . Cannabis abuse 06/11/2012    History reviewed. No pertinent surgical history.  Prior to Admission medications   Medication Sig Start Date End Date Taking? Authorizing Provider  ciprofloxacin (CIPRO) 500 MG tablet Take 1 tablet (500 mg total) by mouth 2 (two) times daily. 06/11/16   Carrie Mew, MD  dicyclomine (BENTYL) 20 MG tablet Take 20 mg by mouth 2 (two) times daily. 02/03/14 02/03/15  [provider]  famotidine (PEPCID) 20 MG tablet Take 20 mg by mouth 2 (two) times daily.    [provider]  famotidine (PEPCID) 20 MG tablet Take 1 tablet (20 mg total) by mouth 2 (two) times daily. 06/11/16   Carrie Mew, MD  ketorolac (TORADOL) 10 MG tablet Take 1 tablet (10 mg total) by mouth every 8 (eight) hours as  needed for moderate pain. 10/06/14   Gregor Hams, MD  metroNIDAZOLE (FLAGYL) 500 MG tablet Take 1 tablet (500 mg total) by mouth 3 (three) times daily. 06/11/16   Carrie Mew, MD  naproxen (NAPROSYN) 500 MG tablet Take 1 tablet (500 mg total) by mouth 2 (two) times daily as needed for mild pain, moderate pain or headache (TAKE WITH MEALS.). 12/06/14   Street, [place], PA-C  naproxen (NAPROSYN) 500 MG tablet Take 1 tablet (500 mg total) by mouth 2 (two) times daily with a meal. 12/24/17   Sable Feil, PA-C  omeprazole (PRILOSEC) 20 MG capsule Take 20 mg by mouth 2 (two) times daily.    [provider]  ondansetron (ZOFRAN) 4 MG tablet Take 4 mg by mouth as needed. Nausea/vomiting    [provider]  ondansetron (ZOFRAN) 8 MG tablet Take 1 tablet (8 mg total) by mouth every 8 (eight) hours as needed for nausea or vomiting. 12/06/14   Street, [place], PA-C  Oxycodone HCl 10 MG TABS Take 5-10 mg by mouth every 4 (four) hours as needed (nausea/vomiting).    [provider]  potassium chloride (K-DUR) 10 MEQ tablet Take 2 tablets (20 mEq total) by mouth daily. 08/17/14   Lavonia Drafts, MD  prochlorperazine (COMPAZINE) 10 MG tablet Take 1 tablet (10 mg total) by mouth every 6 (six) hours as needed for nausea. 08/13/14 08/13/15  Francene Castle  W, MD  prochlorperazine (COMPAZINE) 25 MG suppository Place 1 suppository (25 mg total) rectally every 12 (twelve) hours as needed for nausea. 08/13/14 08/13/15  Ahmed Prima, MD  promethazine (PHENERGAN) 25 MG tablet Take 25 mg by mouth every 6 (six) hours as needed. Nausea/ up to 20 doses 03/29/14   [provider]  promethazine (PHENERGAN) 25 MG tablet Take 1 tablet (25 mg total) by mouth every 6 (six) hours as needed for nausea or vomiting. 03/31/14   Evelina Bucy, MD  ranitidine (ZANTAC) 150 MG tablet Take 1 tablet (150 mg total) by mouth 2 (two) times daily. 12/06/14   Street, [place], PA-C    Allergies Penicillin  g; Shellfish allergy; and Sulfa antibiotics  Family History  Problem Relation Age of Onset  . CAD Father     Social History Social History   Tobacco Use  . Smoking status: Current Every Day Smoker    Types: Cigarettes  . Smokeless tobacco: Never Used  Substance Use Topics  . Alcohol use: Yes    Comment: Former drinker  . Drug use: No    Comment: last used x 1 wk ago    Review of Systems  Constitutional: No fever/chills Eyes: No visual changes. ENT: No sore throat. Cardiovascular: Denies chest pain. Respiratory: Denies shortness of breath. Gastrointestinal: No abdominal pain.  No nausea, no vomiting.  No diarrhea.  No constipation. Genitourinary: Negative for dysuria. Musculoskeletal: Negative for back pain. Skin: Negative for rash. Neurological: Negative for headaches, focal weakness or numbness. Endocrine: Hepatitis C and pancreatitis. Allergic/Immunilogical: Penicillin, shellfish, and sulfur antibiotics.  ____________________________________________   PHYSICAL EXAM:  VITAL SIGNS: ED Triage Vitals  Enc Vitals Group     BP 12/24/17 0838 120/85     Pulse Rate 12/24/17 0838 82     Resp 12/24/17 0838 16     Temp 12/24/17 0838 98.1 F (36.7 C)     Temp Source 12/24/17 0838 Oral     SpO2 12/24/17 0838 100 %     Weight 12/24/17 0839 240 lb (108.9 kg)     Height 12/24/17 0839 6\' 2"  (1.88 m)     Head Circumference --      Peak Flow --      Pain Score 12/24/17 0838 7     Pain Loc --      Pain Edu? --      Excl. in [place]? --    Constitutional: Alert and oriented. Well appearing and in no acute distress. Cardiovascular: Normal rate, regular rhythm. Grossly normal heart sounds.  Good peripheral circulation. Respiratory: Normal respiratory effort.  No retractions. Lungs CTAB. Musculoskeletal: No obvious deformity to the right hand.  No edema or erythema.  Patient is full and equal range of motion. Neurologic:  Normal speech and language. No gross focal neurologic  deficits are appreciated. No gait instability. Skin:  Skin is warm, dry and intact. No rash noted. Psychiatric: Mood and affect are normal. Speech and behavior are normal.  ____________________________________________   LABS (all labs ordered are listed, but only abnormal results are displayed)  Labs Reviewed - No data to display ____________________________________________  EKG   ____________________________________________  RADIOLOGY  ED MD interpretation:    Official radiology report(s): Dg Hand Complete Right  Result Date: 12/24/2017 CLINICAL DATA:  32 year old male jammed hand last week. History of right hand fracture healed on its own. Subsequent encounter. EXAM: RIGHT HAND - COMPLETE 3+ VIEW COMPARISON:  05/10/2013 right second finger films. 03/27/2010 right third finger films. FINDINGS:  No acute fracture or dislocation. Prior fracture right fifth metacarpal. Nonspecific lucency distal aspect right fifth middle phalanx. IMPRESSION: 1. No acute fracture or dislocation. 2. Prior fracture right fifth metacarpal. 3. Nonspecific lucency distal aspect right fifth middle phalanx. Electronically Signed   By: Genia Del M.D.   On: 12/24/2017 09:09    ____________________________________________   PROCEDURES  Procedure(s) performed: None  Procedures  Critical Care performed: No  ____________________________________________   INITIAL IMPRESSION / ASSESSMENT AND PLAN / ED COURSE  As part of my medical decision making, I reviewed the following data within the electronic MEDICAL RECORD NUMBER    Right hand pain secondary to previous fracture not treated by orthopedics.  Patient given discharge care instructions.  Patient advised to follow orthopedic for definitive evaluation and treatment.  Patient given prescription for naproxen.      ____________________________________________   FINAL CLINICAL IMPRESSION(S) / ED DIAGNOSES  Final diagnoses:  Right hand pain      ED Discharge Orders         Ordered    naproxen (NAPROSYN) 500 MG tablet  2 times daily with meals     12/24/17 0932           Note:  This document was prepared using Dragon voice recognition software and may include unintentional dictation errors.    Sable Feil, PA-C 12/24/17 9643    Earleen Newport, MD 12/24/17 1011

## 2017-12-24 NOTE — ED Notes (Signed)
See triage note  States he jammed his right hand last week   Then hit it again this am having pain with min swelling noted to side of hand  Good pulses

## 2017-12-24 NOTE — ED Triage Notes (Signed)
Pt states he jammed his right hand in a door last week and states he has a hx of fx to that hand in the past.

## 2020-04-15 ENCOUNTER — Encounter: Payer: Self-pay | Admitting: *Deleted

## 2020-04-15 ENCOUNTER — Emergency Department

## 2020-04-15 ENCOUNTER — Inpatient Hospital Stay
Admission: EM | Admit: 2020-04-15 | Discharge: 2020-04-18 | DRG: 391 | Attending: Internal Medicine | Admitting: Internal Medicine

## 2020-04-15 ENCOUNTER — Other Ambulatory Visit: Payer: Self-pay

## 2020-04-15 DIAGNOSIS — Z765 Malingerer [conscious simulation]: Secondary | ICD-10-CM

## 2020-04-15 DIAGNOSIS — R1310 Dysphagia, unspecified: Secondary | ICD-10-CM | POA: Diagnosis present

## 2020-04-15 DIAGNOSIS — K2100 Gastro-esophageal reflux disease with esophagitis, without bleeding: Secondary | ICD-10-CM

## 2020-04-15 DIAGNOSIS — K529 Noninfective gastroenteritis and colitis, unspecified: Secondary | ICD-10-CM | POA: Diagnosis present

## 2020-04-15 DIAGNOSIS — R112 Nausea with vomiting, unspecified: Secondary | ICD-10-CM | POA: Diagnosis not present

## 2020-04-15 DIAGNOSIS — R6883 Chills (without fever): Secondary | ICD-10-CM | POA: Diagnosis present

## 2020-04-15 DIAGNOSIS — Z91013 Allergy to seafood: Secondary | ICD-10-CM

## 2020-04-15 DIAGNOSIS — K7689 Other specified diseases of liver: Secondary | ICD-10-CM | POA: Diagnosis present

## 2020-04-15 DIAGNOSIS — D1803 Hemangioma of intra-abdominal structures: Secondary | ICD-10-CM | POA: Diagnosis present

## 2020-04-15 DIAGNOSIS — Z79899 Other long term (current) drug therapy: Secondary | ICD-10-CM

## 2020-04-15 DIAGNOSIS — Z20822 Contact with and (suspected) exposure to covid-19: Secondary | ICD-10-CM | POA: Diagnosis present

## 2020-04-15 DIAGNOSIS — R7989 Other specified abnormal findings of blood chemistry: Secondary | ICD-10-CM | POA: Diagnosis not present

## 2020-04-15 DIAGNOSIS — K860 Alcohol-induced chronic pancreatitis: Secondary | ICD-10-CM | POA: Diagnosis present

## 2020-04-15 DIAGNOSIS — D72829 Elevated white blood cell count, unspecified: Secondary | ICD-10-CM | POA: Diagnosis present

## 2020-04-15 DIAGNOSIS — Z8249 Family history of ischemic heart disease and other diseases of the circulatory system: Secondary | ICD-10-CM

## 2020-04-15 DIAGNOSIS — B1920 Unspecified viral hepatitis C without hepatic coma: Secondary | ICD-10-CM | POA: Diagnosis present

## 2020-04-15 DIAGNOSIS — K2970 Gastritis, unspecified, without bleeding: Secondary | ICD-10-CM | POA: Diagnosis not present

## 2020-04-15 DIAGNOSIS — F1210 Cannabis abuse, uncomplicated: Secondary | ICD-10-CM | POA: Diagnosis present

## 2020-04-15 DIAGNOSIS — R109 Unspecified abdominal pain: Secondary | ICD-10-CM | POA: Diagnosis not present

## 2020-04-15 DIAGNOSIS — K859 Acute pancreatitis without necrosis or infection, unspecified: Secondary | ICD-10-CM | POA: Diagnosis present

## 2020-04-15 DIAGNOSIS — R1110 Vomiting, unspecified: Secondary | ICD-10-CM

## 2020-04-15 DIAGNOSIS — F064 Anxiety disorder due to known physiological condition: Secondary | ICD-10-CM | POA: Diagnosis present

## 2020-04-15 DIAGNOSIS — F17210 Nicotine dependence, cigarettes, uncomplicated: Secondary | ICD-10-CM | POA: Diagnosis present

## 2020-04-15 DIAGNOSIS — R079 Chest pain, unspecified: Secondary | ICD-10-CM

## 2020-04-15 DIAGNOSIS — Z720 Tobacco use: Secondary | ICD-10-CM

## 2020-04-15 DIAGNOSIS — Z882 Allergy status to sulfonamides status: Secondary | ICD-10-CM

## 2020-04-15 DIAGNOSIS — F919 Conduct disorder, unspecified: Secondary | ICD-10-CM | POA: Diagnosis present

## 2020-04-15 LAB — COMPREHENSIVE METABOLIC PANEL
ALT: 54 U/L — ABNORMAL HIGH (ref 0–44)
AST: 42 U/L — ABNORMAL HIGH (ref 15–41)
Albumin: 4.7 g/dL (ref 3.5–5.0)
Alkaline Phosphatase: 65 U/L (ref 38–126)
Anion gap: 16 — ABNORMAL HIGH (ref 5–15)
BUN: 21 mg/dL — ABNORMAL HIGH (ref 6–20)
CO2: 22 mmol/L (ref 22–32)
Calcium: 10.1 mg/dL (ref 8.9–10.3)
Chloride: 98 mmol/L (ref 98–111)
Creatinine, Ser: 1.24 mg/dL (ref 0.61–1.24)
GFR, Estimated: >60 mL/min (ref >60)
Glucose, Bld: 126 mg/dL — ABNORMAL HIGH (ref 70–99)
Potassium: 4.4 mmol/L (ref 3.5–5.1)
Sodium: 136 mmol/L (ref 135–145)
Total Bilirubin: 1.3 mg/dL — ABNORMAL HIGH (ref 0.3–1.2)
Total Protein: 9.4 g/dL — ABNORMAL HIGH (ref 6.5–8.1)

## 2020-04-15 LAB — CBC
HCT: 41.6 % (ref 39.0–52.0)
Hemoglobin: 14.7 g/dL (ref 13.0–17.0)
MCH: 31.1 pg (ref 26.0–34.0)
MCHC: 35.3 g/dL (ref 30.0–36.0)
MCV: 87.9 fL (ref 80.0–100.0)
Platelets: 235 10*3/uL (ref 150–400)
RBC: 4.73 MIL/uL (ref 4.22–5.81)
RDW: 12.3 % (ref 11.5–15.5)
WBC: 16.9 10*3/uL — ABNORMAL HIGH (ref 4.0–10.5)
nRBC: 0 % (ref 0.0–0.2)

## 2020-04-15 LAB — SARS CORONAVIRUS 2 (TAT 6-24 HRS): SARS Coronavirus 2: NEGATIVE

## 2020-04-15 LAB — LIPASE, BLOOD: Lipase: 19 U/L (ref 11–51)

## 2020-04-15 MED ORDER — DROPERIDOL 2.5 MG/ML IJ SOLN
INTRAMUSCULAR | Status: AC
  Administered 2020-04-15: 2.5 mg via INTRAVENOUS
  Filled 2020-04-15: qty 2

## 2020-04-15 MED ORDER — PROMETHAZINE HCL 25 MG/ML IJ SOLN
12.5000 mg | Freq: Once | INTRAMUSCULAR | Status: AC
  Administered 2020-04-15: 12.5 mg via INTRAVENOUS
  Filled 2020-04-15: qty 1

## 2020-04-15 MED ORDER — IOHEXOL 300 MG/ML  SOLN
100.0000 mL | Freq: Once | INTRAMUSCULAR | Status: AC | PRN
  Administered 2020-04-15: 100 mL via INTRAVENOUS

## 2020-04-15 MED ORDER — MORPHINE SULFATE (PF) 2 MG/ML IV SOLN
2.0000 mg | INTRAVENOUS | Status: DC | PRN
  Administered 2020-04-15 (×2): 2 mg via INTRAVENOUS
  Filled 2020-04-15 (×2): qty 1

## 2020-04-15 MED ORDER — ENOXAPARIN SODIUM 40 MG/0.4ML ~~LOC~~ SOLN
40.0000 mg | SUBCUTANEOUS | Status: DC
  Administered 2020-04-16: 40 mg via SUBCUTANEOUS
  Filled 2020-04-15: qty 0.4

## 2020-04-15 MED ORDER — PANTOPRAZOLE SODIUM 40 MG IV SOLR
40.0000 mg | Freq: Once | INTRAVENOUS | Status: AC
  Administered 2020-04-15: 40 mg via INTRAVENOUS
  Filled 2020-04-15: qty 40

## 2020-04-15 MED ORDER — METOCLOPRAMIDE HCL 5 MG/ML IJ SOLN
5.0000 mg | Freq: Three times a day (TID) | INTRAMUSCULAR | Status: DC
  Administered 2020-04-15 – 2020-04-17 (×6): 5 mg via INTRAVENOUS
  Filled 2020-04-15 (×6): qty 2

## 2020-04-15 MED ORDER — NICOTINE 21 MG/24HR TD PT24
21.0000 mg | MEDICATED_PATCH | Freq: Every day | TRANSDERMAL | Status: DC
  Filled 2020-04-15 (×3): qty 1

## 2020-04-15 MED ORDER — FENTANYL CITRATE (PF) 100 MCG/2ML IJ SOLN
25.0000 ug | INTRAMUSCULAR | Status: DC | PRN
  Administered 2020-04-15 – 2020-04-16 (×5): 25 ug via INTRAVENOUS
  Filled 2020-04-15 (×5): qty 2

## 2020-04-15 MED ORDER — ALUM & MAG HYDROXIDE-SIMETH 200-200-20 MG/5ML PO SUSP
30.0000 mL | Freq: Once | ORAL | Status: AC
  Administered 2020-04-15: 30 mL via ORAL
  Filled 2020-04-15: qty 30

## 2020-04-15 MED ORDER — MORPHINE SULFATE (PF) 2 MG/ML IV SOLN: 2.0000 mg | INTRAVENOUS | Status: DC | PRN

## 2020-04-15 MED ORDER — SODIUM CHLORIDE 0.9 % IV BOLUS
1000.0000 mL | Freq: Once | INTRAVENOUS | Status: AC
  Administered 2020-04-15: 1000 mL via INTRAVENOUS

## 2020-04-15 MED ORDER — DROPERIDOL 2.5 MG/ML IJ SOLN
2.5000 mg | Freq: Once | INTRAMUSCULAR | Status: AC
  Administered 2020-04-15: 2.5 mg via INTRAVENOUS
  Filled 2020-04-15: qty 2

## 2020-04-15 MED ORDER — ONDANSETRON 4 MG PO TBDP: 4.0000 mg | ORAL_TABLET | Freq: Three times a day (TID) | ORAL | 0 refills | Status: AC | PRN

## 2020-04-15 MED ORDER — DROPERIDOL 2.5 MG/ML IJ SOLN: 2.5000 mg | Freq: Once | INTRAMUSCULAR | Status: AC

## 2020-04-15 MED ORDER — FAMOTIDINE 20 MG PO TABS
20.0000 mg | ORAL_TABLET | Freq: Two times a day (BID) | ORAL | Status: DC
  Administered 2020-04-15: 20 mg via ORAL
  Filled 2020-04-15 (×2): qty 1

## 2020-04-15 MED ORDER — ONDANSETRON HCL 4 MG PO TABS
4.0000 mg | ORAL_TABLET | Freq: Four times a day (QID) | ORAL | Status: DC | PRN
  Administered 2020-04-15 – 2020-04-16 (×2): 4 mg via ORAL
  Filled 2020-04-15 (×3): qty 1

## 2020-04-15 MED ORDER — SODIUM CHLORIDE 0.9 % IV SOLN: INTRAVENOUS | Status: DC

## 2020-04-15 MED ORDER — IBUPROFEN 400 MG PO TABS
400.0000 mg | ORAL_TABLET | Freq: Four times a day (QID) | ORAL | Status: DC | PRN
  Administered 2020-04-16: 400 mg via ORAL
  Filled 2020-04-15 (×3): qty 1

## 2020-04-15 MED ORDER — DICYCLOMINE HCL 20 MG PO TABS
20.0000 mg | ORAL_TABLET | Freq: Four times a day (QID) | ORAL | Status: DC | PRN
  Administered 2020-04-15 – 2020-04-18 (×2): 20 mg via ORAL
  Filled 2020-04-15 (×3): qty 1

## 2020-04-15 MED ORDER — PANTOPRAZOLE SODIUM 40 MG PO TBEC: 40.0000 mg | DELAYED_RELEASE_TABLET | Freq: Every day | ORAL | 0 refills | Status: AC

## 2020-04-15 MED ORDER — LIDOCAINE VISCOUS HCL 2 % MT SOLN
15.0000 mL | Freq: Once | OROMUCOSAL | Status: AC
  Administered 2020-04-15: 15 mL via ORAL
  Filled 2020-04-15: qty 15

## 2020-04-15 NOTE — ED Notes (Signed)
Pt at CT. NAD noted. Sheriff at bedside

## 2020-04-15 NOTE — ED Notes (Signed)
Patient requesting something for nausea as PO zofran administered had no effect. Primary RN notified and Provider messaged with request. RN awaiting orders and response.

## 2020-04-15 NOTE — ED Notes (Signed)
[facility] also states this pt has a HX of drug seeking. PA states the only reason pt was sent by EMS was because the pt had "bloody vomit" PA states they found red cool aid in his cell and thinks that is what caused the bloody looking vomit. PA wanted to know if pt can be d/c'ed and given symptotic medicine as far as Zofran for nausea and tylenol and such for pain. Dr. Donna Bernard made aware and states pt may be able to be discharged tomorrow. Dr. Donna Bernard asked to call PA and discuss pt and his care.   Pt has had no episodes or dark vomit and has also not asked this RN for any nausea medicine. Pt is now able to eat some food and is able to keep ice chips down at this time. Dr. Donna Bernard agrees no more narcotics are needed and pt has tylenol ordered and Dr. Donna Bernard added ibuprofen as well. Care discussed with officers.   PA-Karen-[phone], this number relayed to Dr. Donna Bernard.

## 2020-04-15 NOTE — ED Notes (Signed)
Pt requests pain medication, MD aware.

## 2020-04-15 NOTE — ED Notes (Signed)
Pt requests pain medication, pt educated that we now have tylenol and ibuprofen available. Pt given PO Zofran before administering PO meds.

## 2020-04-15 NOTE — ED Notes (Signed)
Pt presents to ED in police custody with c/o of chronic upper ABD pain that  "started in again yesterday. Pt states a HX of gastritis and "has shown symptoms of pancreatitis in the past". Pt denies any alcohol intake due to incarceration. Pt states N/V/D that started yesterday. Pt denies any known HX of ulcers. Pt states clots in vomit that just started and states he has had multiple episodes of emesis. Pt denies black tarry stools. Pt denies fevers and chills. Pt also has had a complaint of burning on urination. Pt is A&Ox4. Engineer, structural at bedside. Pt currently handcuffed to side rail. CMS intact.

## 2020-04-15 NOTE — ED Provider Notes (Addendum)
[facility] Emergency Department Provider Note  ____________________________________________   Event Date/Time   First MD Initiated Contact with Patient 04/15/20 [phone]     (approximate)  I have reviewed the triage vital signs and the nursing notes.   HISTORY  Chief Complaint Abdominal Pain, Emesis, and Diarrhea    HPI Albert Peters is a 35 y.o. male  With PMHx chronic pancreatitis, hepatitis, here with abdominal pain, nausea, vomiting.   Patient presents with police as he is currently in jail.  He states that over the last 24 to 48 hours, he has had aching, gnawing, epigastric pain with nausea and vomiting.  He has been essentially unable to eat or drink.  He states he believes it is related to the food that he has been served while he is in jail.  He has had associated chills but no fevers.  No diarrhea.  He states that the pain is worse with eating.  He states the pain occasionally radiates up towards his upper chest and throat area, worse when he lies flat.  No alleviating factors.  He has a history of recurrent nausea and vomiting and cyclical vomiting with similar symptoms.  Denies any recent drug or alcohol use as he has been in jail for the last week.       Past Medical History:  Diagnosis Date  . Gastritis   . Hepatitis C   . Hepatitis C   . Pancreatitis     Patient Active Problem List   Diagnosis Date Noted  . Leukocytosis 04/15/2020  . Intractable nausea and vomiting 04/15/2020  . Hepatitis C   . Gastroesophageal reflux disease with esophagitis without hemorrhage   . Pancreatitis   . Elevated LFTs 06/11/2012  . Abdominal pain 06/11/2012  . History of pancreatitis 06/11/2012  . Gastritis 06/11/2012  . Tobacco abuse 06/11/2012  . Cannabis abuse 06/11/2012    History reviewed. No pertinent surgical history.  Prior to Admission medications   Medication Sig Start Date End Date Taking? Authorizing Provider  ondansetron (ZOFRAN ODT) 4  MG disintegrating tablet Take 1 tablet (4 mg total) by mouth every 8 (eight) hours as needed for nausea or vomiting. 04/15/20  Yes Duffy Bruce, MD  pantoprazole (PROTONIX) 40 MG tablet Take 1 tablet (40 mg total) by mouth daily. 04/15/20 05/15/20 Yes Duffy Bruce, MD  ciprofloxacin (CIPRO) 500 MG tablet Take 1 tablet (500 mg total) by mouth 2 (two) times daily. 06/11/16   Carrie Mew, MD  dicyclomine (BENTYL) 20 MG tablet Take 20 mg by mouth 2 (two) times daily. 02/03/14 02/03/15  [provider]  famotidine (PEPCID) 20 MG tablet Take 20 mg by mouth 2 (two) times daily.    [provider]  famotidine (PEPCID) 20 MG tablet Take 1 tablet (20 mg total) by mouth 2 (two) times daily. 06/11/16   Carrie Mew, MD  ketorolac (TORADOL) 10 MG tablet Take 1 tablet (10 mg total) by mouth every 8 (eight) hours as needed for moderate pain. 10/06/14   Gregor Hams, MD  metroNIDAZOLE (FLAGYL) 500 MG tablet Take 1 tablet (500 mg total) by mouth 3 (three) times daily. 06/11/16   Carrie Mew, MD  naproxen (NAPROSYN) 500 MG tablet Take 1 tablet (500 mg total) by mouth 2 (two) times daily as needed for mild pain, moderate pain or headache (TAKE WITH MEALS.). 12/06/14   Street, [place], PA-C  naproxen (NAPROSYN) 500 MG tablet Take 1 tablet (500 mg total) by mouth 2 (two) times  daily with a meal. 12/24/17   Sable Feil, PA-C  omeprazole (PRILOSEC) 20 MG capsule Take 20 mg by mouth 2 (two) times daily.    [provider]  ondansetron (ZOFRAN) 4 MG tablet Take 4 mg by mouth as needed. Nausea/vomiting    [provider]  ondansetron (ZOFRAN) 8 MG tablet Take 1 tablet (8 mg total) by mouth every 8 (eight) hours as needed for nausea or vomiting. 12/06/14   Street, [employer], PA-C  Oxycodone HCl 10 MG TABS Take 5-10 mg by mouth every 4 (four) hours as needed (nausea/vomiting).    [provider]  potassium chloride (K-DUR) 10 MEQ tablet Take 2 tablets (20 mEq total) by  mouth daily. 08/17/14   Lavonia Drafts, MD  prochlorperazine (COMPAZINE) 10 MG tablet Take 1 tablet (10 mg total) by mouth every 6 (six) hours as needed for nausea. 08/13/14 08/13/15  Ahmed Prima, MD  prochlorperazine (COMPAZINE) 25 MG suppository Place 1 suppository (25 mg total) rectally every 12 (twelve) hours as needed for nausea. 08/13/14 08/13/15  Ahmed Prima, MD  promethazine (PHENERGAN) 25 MG tablet Take 25 mg by mouth every 6 (six) hours as needed. Nausea/ up to 20 doses 03/29/14   [provider]  promethazine (PHENERGAN) 25 MG tablet Take 1 tablet (25 mg total) by mouth every 6 (six) hours as needed for nausea or vomiting. 03/31/14   Evelina Bucy, MD  ranitidine (ZANTAC) 150 MG tablet Take 1 tablet (150 mg total) by mouth 2 (two) times daily. 12/06/14   Street, [place], PA-C    Allergies Penicillin g, Shellfish allergy, and Sulfa antibiotics  Family History  Problem Relation Age of Onset  . CAD Father     Social History Social History   Tobacco Use  . Smoking status: Current Every Day Smoker    Types: Cigarettes  . Smokeless tobacco: Never Used  Substance Use Topics  . Alcohol use: Yes    Comment: Former drinker  . Drug use: No    Comment: last used x 1 wk ago    Review of Systems  Review of Systems  Constitutional: Positive for fatigue. Negative for chills and fever.  HENT: Negative for sore throat.   Respiratory: Negative for shortness of breath.   Cardiovascular: Negative for chest pain.  Gastrointestinal: Positive for nausea and vomiting. Negative for abdominal pain.  Genitourinary: Negative for flank pain.  Musculoskeletal: Negative for neck pain.  Skin: Negative for rash and wound.  Allergic/Immunologic: Negative for immunocompromised state.  Neurological: Positive for weakness. Negative for numbness.  Hematological: Does not bruise/bleed easily.  All other systems reviewed and are negative.     ____________________________________________  PHYSICAL EXAM:      VITAL SIGNS: ED Triage Vitals  Enc Vitals Group     BP 04/15/20 0624 140/86     Pulse Rate 04/15/20 0443 98     Resp 04/15/20 0443 20     Temp 04/15/20 0443 98.3 F (36.8 C)     Temp Source 04/15/20 0443 Oral     SpO2 04/15/20 0443 100 %     Weight 04/15/20 0443 240 lb 1.3 oz (108.9 kg)     Height 04/15/20 0443 6\' 2"  (1.88 m)     Head Circumference --      Peak Flow --      Pain Score 04/15/20 0443 10     Pain Loc --      Pain Edu? --      Excl. in [place]? --  Physical Exam Vitals and nursing note reviewed.  Constitutional:      General: He is not in acute distress.    Appearance: He is well-developed and well-nourished.  HENT:     Head: Normocephalic and atraumatic.  Eyes:     Conjunctiva/sclera: Conjunctivae normal.  Cardiovascular:     Rate and Rhythm: Normal rate and regular rhythm.     Heart sounds: Normal heart sounds.  Pulmonary:     Effort: Pulmonary effort is normal. No respiratory distress.     Breath sounds: No wheezing.  Abdominal:     General: There is no distension.     Tenderness: There is generalized abdominal tenderness (Mild, generalized, primarily epigastric). There is no guarding or rebound.  Musculoskeletal:        General: No edema.     Cervical back: Neck supple.  Skin:    General: Skin is warm.     Capillary Refill: Capillary refill takes less than 2 seconds.     Findings: No rash.  Neurological:     Mental Status: He is alert and oriented to person, place, and time.     Motor: No abnormal muscle tone.       ____________________________________________   LABS (all labs ordered are listed, but only abnormal results are displayed)  Labs Reviewed  COMPREHENSIVE METABOLIC PANEL - Abnormal; Notable for the following components:      Result Value   Glucose, Bld 126 (*)    BUN 21 (*)    Total Protein 9.4 (*)    AST 42 (*)    ALT 54 (*)    Total Bilirubin 1.3 (*)     Anion gap 16 (*)    All other components within normal limits  CBC - Abnormal; Notable for the following components:   WBC 16.9 (*)    All other components within normal limits  SARS CORONAVIRUS 2 (TAT 6-24 HRS)  LIPASE, BLOOD  URINE DRUG SCREEN, QUALITATIVE ([facility] ONLY)  HIV ANTIBODY (ROUTINE TESTING W REFLEX)    ____________________________________________  EKG: Sinus tachy ________________________________________  RADIOLOGY All imaging, including plain films, CT scans, and ultrasounds, independently reviewed by me, and interpretations confirmed via formal radiology reads.  ED MD interpretation:     Official radiology report(s): CT ABDOMEN PELVIS W CONTRAST  Result Date: 04/15/2020 CLINICAL DATA:  Abdominal pain, nausea, vomiting, history of chronic pancreatitis and hepatitis EXAM: CT ABDOMEN AND PELVIS WITH CONTRAST TECHNIQUE: Multidetector CT imaging of the abdomen and pelvis was performed using the standard protocol following bolus administration of intravenous contrast. CONTRAST:  OMNIPAQUE IOHEXOL 300 MG/ML  SOLN COMPARISON:  06/11/2016, 04/28/2014 FINDINGS: Lower chest: No acute abnormality. Hepatobiliary: Redemonstrated unchanged low-attenuation lesion of the central right lobe of the liver, consistent with a cyst or slowly filling hemangioma (series 2, image 18). Unchanged Hyperenhancing subcapsular lesion of the anterior right lobe of the liver, again likely a flash filling hemangioma or perhaps a focal nodular hyperplasia (series 2, image 25). Focal fatty deposition about the falciform ligament. No gallstones, gallbladder wall thickening, or biliary dilatation. Pancreas: Unremarkable. No pancreatic ductal dilatation or surrounding inflammatory changes. Spleen: Normal in size without significant abnormality. Adrenals/Urinary Tract: Adrenal glands are unremarkable. Kidneys are normal, without renal calculi, solid lesion, or hydronephrosis. Bladder is unremarkable.  Stomach/Bowel: Stomach is within normal limits. Appendix appears normal. There is wall thickening of the decompressed colon, involving the ascending, transverse, and proximal descending colon. Vascular/Lymphatic: No significant vascular findings are present. No enlarged abdominal or pelvic lymph nodes.  Reproductive: No mass or other significant abnormality. Other: No abdominal wall hernia or abnormality. No abdominopelvic ascites. Musculoskeletal: No acute or significant osseous findings. IMPRESSION: 1. Wall thickening of the decompressed colon, involving the ascending, transverse, and proximal descending colon, suggestive of nonspecific infectious or inflammatory colitis. 2. Redemonstrated low-attenuation lesion of the central right lobe of the liver, consistent with a cyst or slowly filling hemangioma. Unchanged hyperenhancing subcapsular lesion of the anterior right lobe of the liver, again likely a flash filling hemangioma or perhaps a focal nodular hyperplasia. No further follow-up or characterization is required for these stable, benign lesions. Electronically Signed   By: Eddie Candle M.D.   On: 04/15/2020 12:08    ____________________________________________  PROCEDURES   Procedure(s) performed (including Critical Care):  Procedures  ____________________________________________  INITIAL IMPRESSION / MDM / [place] / ED COURSE  As part of my medical decision making, I reviewed the following data within the [place] notes reviewed and incorporated, Old chart reviewed, Notes from prior ED visits, and [place] Controlled Substance Database       *CEDERICK BROADNAX was evaluated in Emergency Department on 04/15/2020 for the symptoms described in the history of present illness. He was evaluated in the context of the global COVID-19 pandemic, which necessitated consideration that the patient might be at risk for infection with the SARS-CoV-2 virus that causes  COVID-19. Institutional protocols and algorithms that pertain to the evaluation of patients at risk for COVID-19 are in a state of rapid change based on information released by regulatory bodies including the CDC and federal and state organizations. These policies and algorithms were followed during the patient's care in the ED.  Some ED evaluations and interventions may be delayed as a result of limited staffing during the pandemic.*     Medical Decision Making: 35 year old male with well-documented history of recurrent cyclical vomiting here with nausea and vomiting.  Suspect acute exacerbation of his cyclical vomiting, possibly with component of GERD/gastritis given his discomfort and burning symptoms that are worse with lying flat.  He has no significant blood in his emesis.  Hemoglobin is stable.  His abdomen is overall soft and nontender without focal findings.  He has CMP evidence of mild dehydration but no other acute abnormalities.  AST and ALT minimally elevated.  Anion gap is 16 likely due to his dehydration.  Lipase is normal.  He has no focal right upper quadrant tenderness, fever, or symptoms to suggest cholecystitis or appendicitis.  His abdomen is otherwise soft.  He has chronic leukocytosis that is not significantly unchanged from baseline.  Patient has persistent nausea and vomiting despite multiple doses of medication.  CT scan shows possible colitis.  Patient admitted to medicine.  ____________________________________________  FINAL CLINICAL IMPRESSION(S) / ED DIAGNOSES  Final diagnoses:  Gastroesophageal reflux disease with esophagitis without hemorrhage  Non-intractable vomiting with nausea, unspecified vomiting type     MEDICATIONS GIVEN DURING THIS VISIT:  Medications  ondansetron (ZOFRAN) tablet 4 mg (has no administration in time range)  nicotine (NICODERM CQ - dosed in mg/24 hours) patch 21 mg (has no administration in time range)  0.9 %  sodium chloride infusion  (has no administration in time range)  metoCLOPramide (REGLAN) injection 5 mg (has no administration in time range)  morphine 2 MG/ML injection 2 mg (has no administration in time range)  enoxaparin (LOVENOX) injection 40 mg (has no administration in time range)  droperidol (INAPSINE) 2.5 MG/ML injection 2.5 mg (2.5 mg Intravenous  Given 04/15/20 0510)  sodium chloride 0.9 % bolus 1,000 mL (0 mLs Intravenous Stopped 04/15/20 0814)  droperidol (INAPSINE) 2.5 MG/ML injection 2.5 mg (2.5 mg Intravenous Given 04/15/20 0821)  pantoprazole (PROTONIX) injection 40 mg (40 mg Intravenous Given 04/15/20 0821)  alum & mag hydroxide-simeth (MAALOX/MYLANTA) 200-200-20 MG/5ML suspension 30 mL (30 mLs Oral Given 04/15/20 0821)    And  lidocaine (XYLOCAINE) 2 % viscous mouth solution 15 mL (15 mLs Oral Given 04/15/20 0821)  sodium chloride 0.9 % bolus 1,000 mL (1,000 mLs Intravenous Bolus 04/15/20 0822)  promethazine (PHENERGAN) injection 12.5 mg (12.5 mg Intravenous Given 04/15/20 0945)  iohexol (OMNIPAQUE) 300 MG/ML solution 100 mL (100 mLs Intravenous Contrast Given 04/15/20 1149)     ED Discharge Orders         Ordered    pantoprazole (PROTONIX) 40 MG tablet  Daily        04/15/20 0958    ondansetron (ZOFRAN ODT) 4 MG disintegrating tablet  Every 8 hours PRN        04/15/20 0958           Note:  This document was prepared using Dragon voice recognition software and may include unintentional dictation errors.   Duffy Bruce, MD 04/15/20 1005    Duffy Bruce, MD 04/15/20 [phone]

## 2020-04-15 NOTE — ED Notes (Signed)
Pt given water for PO challenge, pt vomited shortly after, MD aware.

## 2020-04-15 NOTE — ED Notes (Signed)
This RN spoke to the nurse at the jail and states pt has a HX of drug seeking behavior and also wants to know if pt can go back to facility and they can do symptomatic care, this RN will make hospitalist aware.

## 2020-04-15 NOTE — ED Notes (Signed)
Pt refuses vitals at this time due to sleeping

## 2020-04-15 NOTE — ED Triage Notes (Addendum)
Pt to ED in custody from jail reporting generalized upper abd pain radiating into the left side if his chest starting yesterday with vomiting and diarrhea "constantly." dysuria also reported since yesterday.  Pt denies fevers to his knowledge.

## 2020-04-15 NOTE — H&P (Signed)
History and Physical    Albert Peters [id number] DOB: July 22, 1985 DOA: 04/15/2020  Referring MD/NP/PA:   PCP: Patient, No Pcp Per   Patient coming from:  The patient is coming from [facility].  At baseline, pt is independent for most of ADL.        Chief Complaint: Abdominal pain, nausea, vomiting, diarrhea  HPI: Albert Peters is a 35 y.o. male with medical history significant of hepatitis C, pancreatitis, gastritis, GERD, tobacco abuse, marijuana abuse, who presents with abdominal pain, nausea, vomiting, diarrhea.  Patient states that his symptoms have been going for 2 days, including nausea, vomiting, diarrhea and abdominal pain.  He has intractable nausea and vomiting with nonbilious nonbloody vomiting.  He has upper abdominal pain, which is constant, sharp, 9 out of 10 severity, nonradiating.  Patient also reports several episodes of diarrhea yesterday, but not today.  No fever or chills.  Patient does not have chest pain, cough, shortness breath.  No symptoms of UTI or unilateral weakness.  ED Course: pt was found to have lipase 19, pending COVID-19 PCR, WBC 16.9, creatinine 1.24, BUN 21, GFR> 60, abnormal liver function (ALP 65, AST 42, ALT 54, total bilirubin 1.3), temperature normal, blood pressure 138/88, heart rate 90, RR 20, oxygen saturation 98% on room air.  CT-abd/pelvis: 1. Wall thickening of the decompressed colon, involving the ascending, transverse, and proximal descending colon, suggestive of nonspecific infectious or inflammatory colitis. 2. Redemonstrated low-attenuation lesion of the central right lobe of the liver, consistent with a cyst or slowly filling hemangioma. Unchanged hyperenhancing subcapsular lesion of the anterior right lobe of the liver, again likely a flash filling hemangioma or perhaps a focal nodular hyperplasia. No further follow-up or characterization is required for these stable, benign lesions  Review of Systems:   General: no fevers,  chills, no body weight gain, has poor appetite, has fatigue HEENT: no blurry vision, hearing changes or sore throat Respiratory: no dyspnea, coughing, wheezing CV: no chest pain, no palpitations GI: has nausea, vomiting, abdominal pain, diarrhea, no constipation GU: no dysuria, burning on urination, increased urinary frequency, hematuria  Ext: no leg edema Neuro: no unilateral weakness, numbness, or tingling, no vision change or hearing loss Skin: no rash, no skin tear. MSK: No muscle spasm, no deformity, no limitation of range of movement in spin Heme: No easy bruising.  Travel history: No recent long distant travel.  Allergy:  Allergies  Allergen Reactions  . Penicillin G Rash  . Shellfish Allergy Rash  . Sulfa Antibiotics Rash    Past Medical History:  Diagnosis Date  . Gastritis   . Hepatitis C   . Hepatitis C   . Pancreatitis     History reviewed. No pertinent surgical history.  Social History:  reports that he has been smoking cigarettes. He has never used smokeless tobacco. He reports current alcohol use. He reports that he does not use drugs.  Family History:  Family History  Problem Relation Age of Onset  . CAD Father      Prior to Admission medications   Medication Sig Start Date End Date Taking? Authorizing Provider  ondansetron (ZOFRAN ODT) 4 MG disintegrating tablet Take 1 tablet (4 mg total) by mouth every 8 (eight) hours as needed for nausea or vomiting. 04/15/20  Yes Duffy Bruce, MD  pantoprazole (PROTONIX) 40 MG tablet Take 1 tablet (40 mg total) by mouth daily. 04/15/20 05/15/20 Yes Duffy Bruce, MD  ciprofloxacin (CIPRO) 500 MG tablet Take 1 tablet (500 mg  total) by mouth 2 (two) times daily. 06/11/16   Carrie Mew, MD  dicyclomine (BENTYL) 20 MG tablet Take 20 mg by mouth 2 (two) times daily. 02/03/14 02/03/15  [provider]  famotidine (PEPCID) 20 MG tablet Take 20 mg by mouth 2 (two) times daily.    [provider]   famotidine (PEPCID) 20 MG tablet Take 1 tablet (20 mg total) by mouth 2 (two) times daily. 06/11/16   Carrie Mew, MD  ketorolac (TORADOL) 10 MG tablet Take 1 tablet (10 mg total) by mouth every 8 (eight) hours as needed for moderate pain. 10/06/14   Gregor Hams, MD  metroNIDAZOLE (FLAGYL) 500 MG tablet Take 1 tablet (500 mg total) by mouth 3 (three) times daily. 06/11/16   Carrie Mew, MD  naproxen (NAPROSYN) 500 MG tablet Take 1 tablet (500 mg total) by mouth 2 (two) times daily as needed for mild pain, moderate pain or headache (TAKE WITH MEALS.). 12/06/14   Street, [place], PA-C  naproxen (NAPROSYN) 500 MG tablet Take 1 tablet (500 mg total) by mouth 2 (two) times daily with a meal. 12/24/17   Sable Feil, PA-C  omeprazole (PRILOSEC) 20 MG capsule Take 20 mg by mouth 2 (two) times daily.    [provider]  ondansetron (ZOFRAN) 4 MG tablet Take 4 mg by mouth as needed. Nausea/vomiting    [provider]  ondansetron (ZOFRAN) 8 MG tablet Take 1 tablet (8 mg total) by mouth every 8 (eight) hours as needed for nausea or vomiting. 12/06/14   Street, [place], PA-C  Oxycodone HCl 10 MG TABS Take 5-10 mg by mouth every 4 (four) hours as needed (nausea/vomiting).    [provider]  potassium chloride (K-DUR) 10 MEQ tablet Take 2 tablets (20 mEq total) by mouth daily. 08/17/14   Lavonia Drafts, MD  prochlorperazine (COMPAZINE) 10 MG tablet Take 1 tablet (10 mg total) by mouth every 6 (six) hours as needed for nausea. 08/13/14 08/13/15  Ahmed Prima, MD  prochlorperazine (COMPAZINE) 25 MG suppository Place 1 suppository (25 mg total) rectally every 12 (twelve) hours as needed for nausea. 08/13/14 08/13/15  Ahmed Prima, MD  promethazine (PHENERGAN) 25 MG tablet Take 25 mg by mouth every 6 (six) hours as needed. Nausea/ up to 20 doses 03/29/14   [provider]  promethazine (PHENERGAN) 25 MG tablet Take 1 tablet (25 mg total) by mouth every 6 (six)  hours as needed for nausea or vomiting. 03/31/14   Evelina Bucy, MD  ranitidine (ZANTAC) 150 MG tablet Take 1 tablet (150 mg total) by mouth 2 (two) times daily. 12/06/14   Street, [place], [place]    Physical Exam: Vitals:   04/15/20 0443 04/15/20 0624 04/15/20 1000 04/15/20 1100  BP:  140/86 123/90 138/88  Pulse: 98 84 95 90  Resp: 20 16 16 16   Temp: 98.3 F (36.8 C)     TempSrc: Oral     SpO2: 100% 100% 98% 98%  Weight: 108.9 kg     Height: 6\' 2"  (1.88 m)      General: Not in acute distress.  Dry mucous membranes HEENT:       Eyes: PERRL, EOMI, no scleral icterus.       ENT: No discharge from the ears and nose, no pharynx injection, no tonsillar enlargement.        Neck: No JVD, no bruit, no mass felt. Heme: No neck lymph node enlargement. Cardiac: S1/S2, RRR, No murmurs, No gallops or rubs.  Respiratory:  No rales, wheezing, rhonchi or rubs. GI: Soft, nondistended, has tenderness in upper abdomen, no rebound pain, no organomegaly, BS present. GU: No hematuria Ext: No pitting leg edema bilaterally. 2+DP/PT pulse bilaterally. Musculoskeletal: No joint deformities, No joint redness or warmth, no limitation of ROM in spin. Skin: No rashes.  Neuro: Alert, oriented X3, cranial nerves II-XII grossly intact, moves all extremities normally Psych: Patient is not psychotic, no suicidal or hemocidal ideation.  Labs on Admission: I have personally reviewed following labs and imaging studies  CBC: Recent Labs  Lab 04/15/20 0509  WBC 16.9*  HGB 14.7  HCT 41.6  MCV 87.9  PLT 016   Basic Metabolic Panel: Recent Labs  Lab 04/15/20 0509  NA 136  K 4.4  CL 98  CO2 22  GLUCOSE 126*  BUN 21*  CREATININE 1.24  CALCIUM 10.1   GFR: Estimated Creatinine Clearance: 108.2 mL/min (by C-G formula based on SCr of 1.24 mg/dL). Liver Function Tests: Recent Labs  Lab 04/15/20 0509  AST 42*  ALT 54*  ALKPHOS 65  BILITOT 1.3*  PROT 9.4*  ALBUMIN 4.7   Recent Labs  Lab  04/15/20 0509  LIPASE 19   No results for input(s): AMMONIA in the last 168 hours. Coagulation Profile: No results for input(s): INR, PROTIME in the last 168 hours. Cardiac Enzymes: No results for input(s): CKTOTAL, CKMB, CKMBINDEX, TROPONINI in the last 168 hours. BNP (last 3 results) No results for input(s): PROBNP in the last 8760 hours. HbA1C: No results for input(s): HGBA1C in the last 72 hours. CBG: No results for input(s): GLUCAP in the last 168 hours. Lipid Profile: No results for input(s): CHOL, HDL, LDLCALC, TRIG, CHOLHDL, LDLDIRECT in the last 72 hours. Thyroid Function Tests: No results for input(s): TSH, T4TOTAL, FREET4, T3FREE, THYROIDAB in the last 72 hours. Anemia Panel: No results for input(s): VITAMINB12, FOLATE, FERRITIN, TIBC, IRON, RETICCTPCT in the last 72 hours. Urine analysis:    Component Value Date/Time   COLORURINE YELLOW 12/06/2014 [place] 12/06/2014 1540   APPEARANCEUR Cloudy 03/30/2014 2119   LABSPEC >1.046 (H) 12/06/2014 1540   LABSPEC 1.023 03/30/2014 2119   PHURINE 8.0 12/06/2014 1540   GLUCOSEU NEGATIVE 12/06/2014 1540   GLUCOSEU Negative 03/30/2014 2119   HGBUR NEGATIVE 12/06/2014 1540   BILIRUBINUR NEGATIVE 12/06/2014 1540   BILIRUBINUR Negative 03/30/2014 2119   KETONESUR NEGATIVE 12/06/2014 1540   PROTEINUR NEGATIVE 12/06/2014 1540   UROBILINOGEN 1.0 12/06/2014 1540   NITRITE NEGATIVE 12/06/2014 1540   LEUKOCYTESUR NEGATIVE 12/06/2014 1540   LEUKOCYTESUR Negative 03/30/2014 2119   Sepsis Labs: @LABRCNTIP (procalcitonin:4,lacticidven:4) )No results found for this or any previous visit (from the past 240 hour(s)).   Radiological Exams on Admission: CT ABDOMEN PELVIS W CONTRAST  Result Date: 04/15/2020 CLINICAL DATA:  Abdominal pain, nausea, vomiting, history of chronic pancreatitis and hepatitis EXAM: CT ABDOMEN AND PELVIS WITH CONTRAST TECHNIQUE: Multidetector CT imaging of the abdomen and pelvis was performed  using the standard protocol following bolus administration of intravenous contrast. CONTRAST:  147mL OMNIPAQUE IOHEXOL 300 MG/ML  SOLN COMPARISON:  06/11/2016, 04/28/2014 FINDINGS: Lower chest: No acute abnormality. Hepatobiliary: Redemonstrated unchanged low-attenuation lesion of the central right lobe of the liver, consistent with a cyst or slowly filling hemangioma (series 2, image 18). Unchanged Hyperenhancing subcapsular lesion of the anterior right lobe of the liver, again likely a flash filling hemangioma or perhaps a focal nodular hyperplasia (series 2, image 25). Focal fatty deposition about the falciform ligament. No gallstones, gallbladder  wall thickening, or biliary dilatation. Pancreas: Unremarkable. No pancreatic ductal dilatation or surrounding inflammatory changes. Spleen: Normal in size without significant abnormality. Adrenals/Urinary Tract: Adrenal glands are unremarkable. Kidneys are normal, without renal calculi, solid lesion, or hydronephrosis. Bladder is unremarkable. Stomach/Bowel: Stomach is within normal limits. Appendix appears normal. There is wall thickening of the decompressed colon, involving the ascending, transverse, and proximal descending colon. Vascular/Lymphatic: No significant vascular findings are present. No enlarged abdominal or pelvic lymph nodes. Reproductive: No mass or other significant abnormality. Other: No abdominal wall hernia or abnormality. No abdominopelvic ascites. Musculoskeletal: No acute or significant osseous findings. IMPRESSION: 1. Wall thickening of the decompressed colon, involving the ascending, transverse, and proximal descending colon, suggestive of nonspecific infectious or inflammatory colitis. 2. Redemonstrated low-attenuation lesion of the central right lobe of the liver, consistent with a cyst or slowly filling hemangioma. Unchanged hyperenhancing subcapsular lesion of the anterior right lobe of the liver, again likely a flash filling hemangioma or  perhaps a focal nodular hyperplasia. No further follow-up or characterization is required for these stable, benign lesions. Electronically Signed   By: Eddie Candle M.D.   On: 04/15/2020 12:08     EKG: I have personally reviewed.  Sinus rhythm, QTC 439, LAE, nonspecific T wave change  Assessment/Plan Principal Problem:   Abdominal pain Active Problems:   Elevated LFTs   Gastritis   Tobacco abuse   Leukocytosis   Intractable nausea and vomiting   Abdominal pain, intractable nausea and vomiting and diarrhea: Etiology is not clear.  Lipase normal.  CT scan findings are suggestive of nonspecific infectious or inflammatory colitis.  -will place on med-surg bed for obs -prn Zofran and scheduled regular 5 mg 3 times daily -As needed morphine for abdominal pain -IV fluid: 2 L normal saline, followed by 100 cc/h -f/u C diff and GI path panel  Elevated LFTs:  CT scan showed no gallstones, gallbladder wall thickening, or biliary dilatation. Possibly due to hx of HCV. -Avoid using Tylenol -Follow-up with GI  Hx of Gastritis -pepcide  Tobacco abuse -Nicotine patch  Leukocytosis: WBC 16.9.  This seems to be a chronic issue.  Patient has WBC 21.8 on 06/10/2016, and 15.4 on 03/23/2019.  No fever.  No source of infection identified. -Follow-up with CBC    DVT ppx:  SQ Lovenox Code Status: Full code Family Communication: police man at bedside Disposition Plan:  Anticipate discharge back to previous environment Consults called:  none Admission status: Med-surg bed for obs   Status is: Observation  The patient remains OBS appropriate and will d/c before 2 midnights.  Dispo: The patient is from: jail              Anticipated d/c is to: jail              Anticipated d/c date is: 1 day              Patient currently is not medically stable to d/c.          Date of Service 04/15/2020    Ivor Costa [facility]   If 7PM-7AM, please contact  night-coverage [email or web address] 04/15/2020, 1:22 PM

## 2020-04-16 ENCOUNTER — Observation Stay

## 2020-04-16 DIAGNOSIS — D72829 Elevated white blood cell count, unspecified: Secondary | ICD-10-CM

## 2020-04-16 DIAGNOSIS — K859 Acute pancreatitis without necrosis or infection, unspecified: Secondary | ICD-10-CM | POA: Diagnosis present

## 2020-04-16 DIAGNOSIS — R1110 Vomiting, unspecified: Secondary | ICD-10-CM

## 2020-04-16 DIAGNOSIS — K529 Noninfective gastroenteritis and colitis, unspecified: Secondary | ICD-10-CM | POA: Diagnosis present

## 2020-04-16 DIAGNOSIS — Z79899 Other long term (current) drug therapy: Secondary | ICD-10-CM | POA: Diagnosis not present

## 2020-04-16 DIAGNOSIS — F17210 Nicotine dependence, cigarettes, uncomplicated: Secondary | ICD-10-CM | POA: Diagnosis present

## 2020-04-16 DIAGNOSIS — B1920 Unspecified viral hepatitis C without hepatic coma: Secondary | ICD-10-CM | POA: Diagnosis present

## 2020-04-16 DIAGNOSIS — Z91013 Allergy to seafood: Secondary | ICD-10-CM | POA: Diagnosis not present

## 2020-04-16 DIAGNOSIS — R109 Unspecified abdominal pain: Secondary | ICD-10-CM

## 2020-04-16 DIAGNOSIS — F919 Conduct disorder, unspecified: Secondary | ICD-10-CM | POA: Diagnosis present

## 2020-04-16 DIAGNOSIS — R7989 Other specified abnormal findings of blood chemistry: Secondary | ICD-10-CM | POA: Diagnosis not present

## 2020-04-16 DIAGNOSIS — F1210 Cannabis abuse, uncomplicated: Secondary | ICD-10-CM | POA: Diagnosis present

## 2020-04-16 DIAGNOSIS — Z765 Malingerer [conscious simulation]: Secondary | ICD-10-CM | POA: Diagnosis not present

## 2020-04-16 DIAGNOSIS — R112 Nausea with vomiting, unspecified: Secondary | ICD-10-CM | POA: Diagnosis present

## 2020-04-16 DIAGNOSIS — K2970 Gastritis, unspecified, without bleeding: Principal | ICD-10-CM

## 2020-04-16 DIAGNOSIS — R6883 Chills (without fever): Secondary | ICD-10-CM | POA: Diagnosis present

## 2020-04-16 DIAGNOSIS — D1803 Hemangioma of intra-abdominal structures: Secondary | ICD-10-CM | POA: Diagnosis present

## 2020-04-16 DIAGNOSIS — K860 Alcohol-induced chronic pancreatitis: Secondary | ICD-10-CM | POA: Diagnosis present

## 2020-04-16 DIAGNOSIS — Z20822 Contact with and (suspected) exposure to covid-19: Secondary | ICD-10-CM | POA: Diagnosis present

## 2020-04-16 DIAGNOSIS — R1310 Dysphagia, unspecified: Secondary | ICD-10-CM | POA: Diagnosis present

## 2020-04-16 DIAGNOSIS — Z882 Allergy status to sulfonamides status: Secondary | ICD-10-CM | POA: Diagnosis not present

## 2020-04-16 DIAGNOSIS — K2100 Gastro-esophageal reflux disease with esophagitis, without bleeding: Secondary | ICD-10-CM | POA: Diagnosis present

## 2020-04-16 LAB — URINE DRUG SCREEN, QUALITATIVE (ARMC ONLY)
Amphetamines, Ur Screen: NOT DETECTED
Barbiturates, Ur Screen: NOT DETECTED
Benzodiazepine, Ur Scrn: NOT DETECTED
Cannabinoid 50 Ng, Ur ~~LOC~~: POSITIVE — AB
Cocaine Metabolite,Ur ~~LOC~~: NOT DETECTED
MDMA (Ecstasy)Ur Screen: NOT DETECTED
Methadone Scn, Ur: NOT DETECTED
Opiate, Ur Screen: POSITIVE — AB
Phencyclidine (PCP) Ur S: NOT DETECTED
Tricyclic, Ur Screen: NOT DETECTED

## 2020-04-16 LAB — COMPREHENSIVE METABOLIC PANEL
ALT: 41 U/L (ref 0–44)
AST: 41 U/L (ref 15–41)
Albumin: 3.9 g/dL (ref 3.5–5.0)
Alkaline Phosphatase: 52 U/L (ref 38–126)
Anion gap: 11 (ref 5–15)
BUN: 15 mg/dL (ref 6–20)
CO2: 25 mmol/L (ref 22–32)
Calcium: 8.5 mg/dL — ABNORMAL LOW (ref 8.9–10.3)
Chloride: 105 mmol/L (ref 98–111)
Creatinine, Ser: 0.98 mg/dL (ref 0.61–1.24)
GFR, Estimated: >60 mL/min (ref >60)
Glucose, Bld: 97 mg/dL (ref 70–99)
Potassium: 3.5 mmol/L (ref 3.5–5.1)
Sodium: 141 mmol/L (ref 135–145)
Total Bilirubin: 1.3 mg/dL — ABNORMAL HIGH (ref 0.3–1.2)
Total Protein: 7.5 g/dL (ref 6.5–8.1)

## 2020-04-16 LAB — CBC
HCT: 37.7 % — ABNORMAL LOW (ref 39.0–52.0)
Hemoglobin: 12.9 g/dL — ABNORMAL LOW (ref 13.0–17.0)
MCH: 31.3 pg (ref 26.0–34.0)
MCHC: 34.2 g/dL (ref 30.0–36.0)
MCV: 91.5 fL (ref 80.0–100.0)
Platelets: 193 10*3/uL (ref 150–400)
RBC: 4.12 MIL/uL — ABNORMAL LOW (ref 4.22–5.81)
RDW: 12.9 % (ref 11.5–15.5)
WBC: 12.8 10*3/uL — ABNORMAL HIGH (ref 4.0–10.5)
nRBC: 0 % (ref 0.0–0.2)

## 2020-04-16 LAB — HIV ANTIBODY (ROUTINE TESTING W REFLEX): HIV Screen 4th Generation wRfx: NONREACTIVE

## 2020-04-16 MED ORDER — ONDANSETRON HCL 4 MG/2ML IJ SOLN
4.0000 mg | Freq: Four times a day (QID) | INTRAMUSCULAR | Status: DC | PRN
  Administered 2020-04-16 – 2020-04-18 (×3): 4 mg via INTRAVENOUS
  Filled 2020-04-16 (×3): qty 2

## 2020-04-16 MED ORDER — KETOROLAC TROMETHAMINE 30 MG/ML IJ SOLN
30.0000 mg | Freq: Three times a day (TID) | INTRAMUSCULAR | Status: DC | PRN
  Administered 2020-04-16 – 2020-04-18 (×3): 30 mg via INTRAVENOUS
  Filled 2020-04-16 (×6): qty 1

## 2020-04-16 MED ORDER — METHOCARBAMOL 500 MG PO TABS
750.0000 mg | ORAL_TABLET | Freq: Four times a day (QID) | ORAL | Status: DC | PRN
  Administered 2020-04-16 – 2020-04-18 (×3): 750 mg via ORAL
  Filled 2020-04-16 (×5): qty 2

## 2020-04-16 MED ORDER — FAMOTIDINE IN NACL 20-0.9 MG/50ML-% IV SOLN
20.0000 mg | Freq: Two times a day (BID) | INTRAVENOUS | Status: DC
  Administered 2020-04-16 – 2020-04-18 (×5): 20 mg via INTRAVENOUS
  Filled 2020-04-16 (×6): qty 50

## 2020-04-16 MED ORDER — HYDROMORPHONE HCL 1 MG/ML IJ SOLN
1.0000 mg | Freq: Four times a day (QID) | INTRAMUSCULAR | Status: DC | PRN
  Administered 2020-04-16 – 2020-04-17 (×3): 1 mg via INTRAVENOUS
  Filled 2020-04-16 (×5): qty 1

## 2020-04-16 MED ORDER — BOOST / RESOURCE BREEZE PO LIQD CUSTOM
1.0000 | Freq: Three times a day (TID) | ORAL | Status: DC
  Administered 2020-04-17: 1 via ORAL

## 2020-04-16 NOTE — Progress Notes (Signed)
Initial Nutrition Assessment  DOCUMENTATION CODES:   Not applicable  INTERVENTION:  Provide Boost Breeze po TID, each supplement provides 250 kcal and 9 grams of protein.  NUTRITION DIAGNOSIS:   Inadequate oral intake related to decreased appetite,nausea,vomiting as evidenced by per patient/family report.  GOAL:   Patient will meet greater than or equal to 90% of their needs  MONITOR:   PO intake,Supplement acceptance,Labs,Weight trends,I & O's  REASON FOR ASSESSMENT:   Malnutrition Screening Tool    ASSESSMENT:   35 year old male with PMHx of hepatitis C, gastritis, pancreatitis, GERD admitted with abdominal pain, N/V/D.   Met with patient at bedside. He reports he has had a decreased appetite and intake related to the N/V/D. He reports [facility] [place] [facility] [place] and has limited food choices. He reports some of the foods he ate likely caused a flare. He reports he has gastritis flares occasionally from certain foods or alcohol. His diarrhea has improved now but he continues to have nausea. He could not tolerate any solid foods so far today. He is requesting liquid options. RD showed patient liquid options available on menu. He also requested frozen ice and RD brought to patient. He is amenable to trying ONS to help meet calorie/protein needs but wanted to try a few options. RD brought patient Ensure Enlive and Boost Breeze to try. Patient reports he prefers [employer].  Patient reports he feels he has lost weight but is unsure of specifics. Last weight in chart was 108.9 kg on 12/24/2017. Will continue to monitor weight trend.  Medications reviewed and include: Reglan 5 mg Q8hrs IV, nicotine patch, NS at 100 mL/hr, famotidine.  Labs reviewed.  Patient does not meet criteria for malnutrition at this time.  NUTRITION - FOCUSED PHYSICAL EXAM:  Flowsheet Row Most Recent Value  Orbital Region No depletion  Upper Arm Region No depletion  Thoracic and Lumbar  Region No depletion  Buccal Region No depletion  Temple Region No depletion  Clavicle Bone Region No depletion  Clavicle and Acromion Bone Region No depletion  Scapular Bone Region Unable to assess  Dorsal Hand No depletion  Patellar Region No depletion  Anterior Thigh Region No depletion  Posterior Calf Region No depletion  Edema (RD Assessment) None  Hair Reviewed  Eyes Reviewed  Mouth Reviewed  Skin Reviewed  Nails Reviewed     Diet Order:   Diet Order            Diet regular Room service appropriate? Yes; Fluid consistency: Thin  Diet effective now                EDUCATION NEEDS:   No education needs have been identified at this time  Skin:  Skin Assessment: Reviewed RN Assessment  Last BM:  04/14/2020 per chart  Height:   Ht Readings from Last 1 Encounters:  04/15/20 6' 2.02" (1.88 m)   Weight:   Wt Readings from Last 1 Encounters:  04/15/20 87.2 kg   BMI:  Body mass index is 24.67 kg/m.  Estimated Nutritional Needs:   Kcal:  2250-2450  Protein:  110-120 grams  Fluid:  >/= 2 L/day  Jacklynn Barnacle, MS, RD, LDN Pager number available on Amion
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
## 2020-04-16 NOTE — Progress Notes (Signed)
PROGRESS NOTE    Albert Peters  NGE:[id number] DOB: September 17, 1985 DOA: 04/15/2020 PCP: Patient, No Pcp Per   Brief Narrative: Taken from H&P. Albert Peters is a 35 y.o. male with medical history significant of hepatitis C, pancreatitis, gastritis, GERD, tobacco abuse, marijuana abuse, who presents with abdominal pain, nausea, vomiting, diarrhea.  Patient states that his symptoms have been going for 2 days, including nausea, vomiting, diarrhea and abdominal pain.  He has intractable nausea and vomiting with nonbilious nonbloody vomiting.  He has upper abdominal pain, which is constant, sharp, 9 out of 10 severity, nonradiating.  Patient also reports several episodes of diarrhea which resolved spontaneously, no bowel movement while in the hospital. Labs positive for mildly elevated T bili at 1.3, CT abdomen with wall thickening of the colon involving the ascending, transverse and proximal descending suggestive of nonspecific infectious or inflammatory colitis. CT abdomen also shows stable liver lesions, which can be assessed for slowly filling hemangioma.  No further work-up is required.  Continue to have abdominal pain, asking for more and more Dilaudid.  Subjective: Patient was complaining of 10/10 abdominal pain.  Easily directable.  He was experiencing some nausea, no vomiting.  Stating that he is scared of eating.  No more diarrhea.  He was asking for Dilaudid, stating that fentanyl is not working for him.  Assessment & Plan:   Principal Problem:   Abdominal pain Active Problems:   Elevated LFTs   Gastritis   Tobacco abuse   Leukocytosis   Intractable nausea and vomiting  Abdominal pain with nausea and vomiting.  Symptoms are more consistent with gastroenteritis.  Diarrhea has been resolved.  Nausea and vomiting improving.  CT findings with nonspecific infectious/inflammatory colitis.  Lipase within normal limit.  Subjective complaints of 10/10 pain which did not match with clinical  exam.  UDS positive for cannabinoid and opiates.  Patient did endorse using weed regularly. Can be cannabinoid induced nausea and vomiting. GI was consulted to comment on CT findings. -Continue with Protonix. -Continue with every 6 hourly Dilaudid as needed-we will not increase the frequency. -Add Toradol. -Continue with Zofran as needed. -No need for C. difficile or GI pathogen-discontinue the precautions.  Elevated LFTs.  Liver enzymes has been normalized, T bili remained at 1.3.  Possibly due to history of HCV. -Continue to monitor  Leukocytosis.  Seems chronic.  White cell counts were 12 this morning but all cell lines decreased might be some dilutional effect with IV fluid. No other obvious source of infection.  Tobacco abuse -Nicotine patch  Objective: Vitals:   04/15/20 2130 04/15/20 2211 04/16/20 0355 04/16/20 0852  BP: (!) 137/109 129/86 135/70 (!) 149/95  Pulse: 88 85 88 79  Resp:  20 20 20   Temp: 98.3 F (36.8 C) 98.2 F (36.8 C) 98.6 F (37 C) 99.2 F (37.3 C)  TempSrc: Oral Oral Oral Oral  SpO2: 98% 100% 99% 100%  Weight:  87.2 kg    Height:  6' 2.02" (1.88 m)      Intake/Output Summary (Last 24 hours) at 04/16/2020 1400 Last data filed at 04/16/2020 0300 Gross per 24 hour  Intake 80 ml  Output --  Net 80 ml   Filed Weights   04/15/20 0443 04/15/20 2211  Weight: 108.9 kg 87.2 kg    Examination:  General exam: Appears calm and comfortable  Respiratory system: Clear to auscultation. Respiratory effort normal. Cardiovascular system: S1 & S2 heard, RRR. Gastrointestinal system: Soft, nontender, nondistended, bowel sounds positive. Central  nervous system: Alert and oriented. No focal neurological deficits.Symmetric 5 x 5 power. Extremities: No edema, no cyanosis, pulses intact and symmetrical. Skin: No rashes, lesions or ulcers Psychiatry: Judgement and insight appear normal.   DVT prophylaxis: Lovenox Code Status: Full Family Communication: Discussed  with patient and the police officer present in room. Disposition Plan:  Status is: Inpatient  Remains inpatient appropriate because:Inpatient level of care appropriate due to severity of illness   Dispo: The patient is from: [place]              Anticipated d/c is to: [place]              Anticipated d/c date is: 1 day              Patient currently is not medically stable to d/c.   Consultants:   Gastroenterology  Procedures:  Antimicrobials:   Data Reviewed: I have personally reviewed following labs and imaging studies  CBC: Recent Labs  Lab 04/15/20 0509 04/16/20 0319  WBC 16.9* 12.8*  HGB 14.7 12.9*  HCT 41.6 37.7*  MCV 87.9 91.5  PLT 235 0000000   Basic Metabolic Panel: Recent Labs  Lab 04/15/20 0509 04/16/20 0319  NA 136 141  K 4.4 3.5  CL 98 105  CO2 22 25  GLUCOSE 126* 97  BUN 21* 15  CREATININE 1.24 0.98  CALCIUM 10.1 8.5*   GFR: Estimated Creatinine Clearance: 121.2 mL/min (by C-G formula based on SCr of 0.98 mg/dL). Liver Function Tests: Recent Labs  Lab 04/15/20 0509 04/16/20 0319  AST 42* 41  ALT 54* 41  ALKPHOS 65 52  BILITOT 1.3* 1.3*  PROT 9.4* 7.5  ALBUMIN 4.7 3.9   Recent Labs  Lab 04/15/20 0509  LIPASE 19   No results for input(s): AMMONIA in the last 168 hours. Coagulation Profile: No results for input(s): INR, PROTIME in the last 168 hours. Cardiac Enzymes: No results for input(s): CKTOTAL, CKMB, CKMBINDEX, TROPONINI in the last 168 hours. BNP (last 3 results) No results for input(s): PROBNP in the last 8760 hours. HbA1C: No results for input(s): HGBA1C in the last 72 hours. CBG: No results for input(s): GLUCAP in the last 168 hours. Lipid Profile: No results for input(s): CHOL, HDL, LDLCALC, TRIG, CHOLHDL, LDLDIRECT in the last 72 hours. Thyroid Function Tests: No results for input(s): TSH, T4TOTAL, FREET4, T3FREE, THYROIDAB in the last 72 hours. Anemia Panel: No results for input(s): VITAMINB12, FOLATE, FERRITIN, TIBC,  IRON, RETICCTPCT in the last 72 hours. Sepsis Labs: No results for input(s): PROCALCITON, LATICACIDVEN in the last 168 hours.  Recent Results (from the past 240 hour(s))  SARS CORONAVIRUS 2 (TAT 6-24 HRS) Nasopharyngeal Nasopharyngeal Swab     Status: None   Collection Time: 04/15/20 11:01 AM   Specimen: Nasopharyngeal Swab  Result Value Ref Range Status   SARS Coronavirus 2 NEGATIVE NEGATIVE Final    Comment: (NOTE) SARS-CoV-2 target nucleic acids are NOT DETECTED.  The SARS-CoV-2 RNA is generally detectable in upper and lower respiratory specimens during the acute phase of infection. Negative results do not preclude SARS-CoV-2 infection, do not rule out co-infections with other pathogens, and should not be used as the sole basis for treatment or other patient management decisions. Negative results must be combined with clinical observations, patient history, and epidemiological information. The expected result is Negative.  Fact Sheet for Patients: SugarRoll.be  Fact Sheet for Healthcare Providers: https://www.woods-mathews.com/  This test is not yet approved or cleared by the [place] FDA  and  has been authorized for detection and/or diagnosis of SARS-CoV-2 by FDA under an Emergency Use Authorization (EUA). This EUA will remain  in effect (meaning this test can be used) for the duration of the COVID-19 declaration under Se ction 564(b)(1) of the Act, 21 U.S.C. section 360bbb-3(b)(1), unless the authorization is terminated or revoked sooner.  Performed at [facility], [place] [address], [place], [place] [address]      Radiology Studies: DG Chest 2 View  Result Date: 04/16/2020 CLINICAL DATA:  Chest pain EXAM: CHEST - 2 VIEW COMPARISON:  03/25/2011 FINDINGS: The heart size and mediastinal contours are within normal limits. Both lungs are clear. The visualized skeletal structures are unremarkable. IMPRESSION: No active  cardiopulmonary disease. Electronically Signed   By: Franchot Gallo M.D.   On: 04/16/2020 11:17   CT ABDOMEN PELVIS W CONTRAST  Result Date: 04/15/2020 CLINICAL DATA:  Abdominal pain, nausea, vomiting, history of chronic pancreatitis and hepatitis EXAM: CT ABDOMEN AND PELVIS WITH CONTRAST TECHNIQUE: Multidetector CT imaging of the abdomen and pelvis was performed using the standard protocol following bolus administration of intravenous contrast. CONTRAST:  160mL OMNIPAQUE IOHEXOL 300 MG/ML  SOLN COMPARISON:  06/11/2016, 04/28/2014 FINDINGS: Lower chest: No acute abnormality. Hepatobiliary: Redemonstrated unchanged low-attenuation lesion of the central right lobe of the liver, consistent with a cyst or slowly filling hemangioma (series 2, image 18). Unchanged Hyperenhancing subcapsular lesion of the anterior right lobe of the liver, again likely a flash filling hemangioma or perhaps a focal nodular hyperplasia (series 2, image 25). Focal fatty deposition about the falciform ligament. No gallstones, gallbladder wall thickening, or biliary dilatation. Pancreas: Unremarkable. No pancreatic ductal dilatation or surrounding inflammatory changes. Spleen: Normal in size without significant abnormality. Adrenals/Urinary Tract: Adrenal glands are unremarkable. Kidneys are normal, without renal calculi, solid lesion, or hydronephrosis. Bladder is unremarkable. Stomach/Bowel: Stomach is within normal limits. Appendix appears normal. There is wall thickening of the decompressed colon, involving the ascending, transverse, and proximal descending colon. Vascular/Lymphatic: No significant vascular findings are present. No enlarged abdominal or pelvic lymph nodes. Reproductive: No mass or other significant abnormality. Other: No abdominal wall hernia or abnormality. No abdominopelvic ascites. Musculoskeletal: No acute or significant osseous findings. IMPRESSION: 1. Wall thickening of the decompressed colon, involving the  ascending, transverse, and proximal descending colon, suggestive of nonspecific infectious or inflammatory colitis. 2. Redemonstrated low-attenuation lesion of the central right lobe of the liver, consistent with a cyst or slowly filling hemangioma. Unchanged hyperenhancing subcapsular lesion of the anterior right lobe of the liver, again likely a flash filling hemangioma or perhaps a focal nodular hyperplasia. No further follow-up or characterization is required for these stable, benign lesions. Electronically Signed   By: Eddie Candle M.D.   On: 04/15/2020 12:08    Scheduled Meds: . enoxaparin (LOVENOX) injection  40 mg Subcutaneous Q24H  . metoCLOPramide (REGLAN) injection  5 mg Intravenous Q8H  . nicotine  21 mg Transdermal Daily   Continuous Infusions: . sodium chloride 100 mL/hr at 04/16/20 0350  . famotidine (PEPCID) IV       LOS: 0 days   Time spent: 35 minutes.  Lorella Nimrod, MD [facility]  If 7PM-7AM, please contact night-coverage [email or web address]  04/16/2020, 2:00 PM   This record has been created using Systems analyst. Errors have been sought and corrected,but may not always be located. Such creation errors do not reflect on the standard of care.

## 2020-04-16 NOTE — Progress Notes (Signed)
Pt complaining of abdominal pain 10/10, morphine given but did not help. Sharion Settler NP informed.

## 2020-04-17 ENCOUNTER — Inpatient Hospital Stay

## 2020-04-17 DIAGNOSIS — R7989 Other specified abnormal findings of blood chemistry: Secondary | ICD-10-CM | POA: Diagnosis not present

## 2020-04-17 DIAGNOSIS — R112 Nausea with vomiting, unspecified: Secondary | ICD-10-CM | POA: Diagnosis not present

## 2020-04-17 DIAGNOSIS — R109 Unspecified abdominal pain: Secondary | ICD-10-CM | POA: Diagnosis not present

## 2020-04-17 DIAGNOSIS — K2970 Gastritis, unspecified, without bleeding: Secondary | ICD-10-CM | POA: Diagnosis not present

## 2020-04-17 DIAGNOSIS — D72829 Elevated white blood cell count, unspecified: Secondary | ICD-10-CM | POA: Diagnosis not present

## 2020-04-17 LAB — CBC
HCT: 35.1 % — ABNORMAL LOW (ref 39.0–52.0)
Hemoglobin: 11.6 g/dL — ABNORMAL LOW (ref 13.0–17.0)
MCH: 31.3 pg (ref 26.0–34.0)
MCHC: 33 g/dL (ref 30.0–36.0)
MCV: 94.6 fL (ref 80.0–100.0)
Platelets: 162 10*3/uL (ref 150–400)
RBC: 3.71 MIL/uL — ABNORMAL LOW (ref 4.22–5.81)
RDW: 12.4 % (ref 11.5–15.5)
WBC: 10.8 10*3/uL — ABNORMAL HIGH (ref 4.0–10.5)
nRBC: 0 % (ref 0.0–0.2)

## 2020-04-17 LAB — COMPREHENSIVE METABOLIC PANEL
ALT: 38 U/L (ref 0–44)
AST: 41 U/L (ref 15–41)
Albumin: 3.5 g/dL (ref 3.5–5.0)
Alkaline Phosphatase: 44 U/L (ref 38–126)
Anion gap: 8 (ref 5–15)
BUN: 14 mg/dL (ref 6–20)
CO2: 26 mmol/L (ref 22–32)
Calcium: 8.8 mg/dL — ABNORMAL LOW (ref 8.9–10.3)
Chloride: 105 mmol/L (ref 98–111)
Creatinine, Ser: 1.08 mg/dL (ref 0.61–1.24)
GFR, Estimated: >60 mL/min (ref >60)
Glucose, Bld: 101 mg/dL — ABNORMAL HIGH (ref 70–99)
Potassium: 3.9 mmol/L (ref 3.5–5.1)
Sodium: 139 mmol/L (ref 135–145)
Total Bilirubin: 1 mg/dL (ref 0.3–1.2)
Total Protein: 6.7 g/dL (ref 6.5–8.1)

## 2020-04-17 MED ORDER — POLYETHYLENE GLYCOL 3350 17 G PO PACK
17.0000 g | PACK | Freq: Every day | ORAL | Status: DC
  Administered 2020-04-17 – 2020-04-18 (×2): 17 g via ORAL
  Filled 2020-04-17 (×2): qty 1

## 2020-04-17 MED ORDER — METOCLOPRAMIDE HCL 5 MG PO TABS
5.0000 mg | ORAL_TABLET | Freq: Three times a day (TID) | ORAL | Status: DC
  Administered 2020-04-17 – 2020-04-18 (×2): 5 mg via ORAL
  Filled 2020-04-17 (×2): qty 1

## 2020-04-17 MED ORDER — HYDROMORPHONE HCL 1 MG/ML IJ SOLN
1.0000 mg | Freq: Once | INTRAMUSCULAR | Status: AC
  Administered 2020-04-17: 1 mg via INTRAVENOUS
  Filled 2020-04-17: qty 1

## 2020-04-17 NOTE — Progress Notes (Signed)
Albert Peters  WVP:[id number] DOB: 1985-06-01 DOA: 04/15/2020 PCP: Patient, No Pcp Per   Brief Narrative: Taken from H&P. Albert Peters is a 35 y.o. male with medical history significant of hepatitis C, pancreatitis, gastritis, GERD, tobacco abuse, marijuana abuse, who presents with abdominal pain, nausea, vomiting, diarrhea.  Patient states that his symptoms have been going for 2 days, including nausea, vomiting, diarrhea and abdominal pain.  He has intractable nausea and vomiting with nonbilious nonbloody vomiting.  He has upper abdominal pain, which is constant, sharp, 9 out of 10 severity, nonradiating.  Patient also reports several episodes of diarrhea which resolved spontaneously, no bowel movement while in the hospital. Labs positive for mildly elevated T bili at 1.3, CT abdomen with wall thickening of the colon involving the ascending, transverse and proximal descending suggestive of nonspecific infectious or inflammatory colitis. CT abdomen also shows stable liver lesions, which can be assessed for slowly filling hemangioma.  No further work-up is required.  Continue to have abdominal pain, asking for more and more Dilaudid.  Subjective: Patient was lying comfortably talking on phone when I entered in the room.  As soon as he get out of the room started screaming that he is in pain and asking for more Dilaudid, stating that he cannot wait for 6 hours to get next dose. Tried explaining that Dilaudid is not required with colitis rather he is becoming constipated that can worsen his abdominal symptoms and he should not be getting any opioids.  Patient becomes very aggressive and shouting. Then he started begging just give me 1 more dose. Policeman at bedside and remained on his phone.  Assessment & Plan:   Principal Problem:   Abdominal pain Active Problems:   Elevated LFTs   Gastritis   Tobacco abuse   Leukocytosis   Intractable nausea and vomiting    Non-intractable vomiting  Abdominal pain with nausea and vomiting.  Symptoms are more consistent with gastroenteritis.  Diarrhea has been resolved.  Nausea and vomiting improving.  CT findings with nonspecific infectious/inflammatory colitis.  Lipase within normal limit.  Subjective complaints of 10/10 pain which did not match with clinical exam.  UDS positive for cannabinoid and opiates. Can be cannabinoid induced nausea and vomiting. GI was consulted to comment on CT findings-no further work-up needed according to GI note.  They are recommending advancement in diet. -Repeat CT abdomen as he was screaming on nursing staff stating that he is having 10 out of 10 pain and asking for more Dilaudid, if no other acute findings we will avoid opioids due to his drug-seeking behavior and opioids are not required for treatment of colitis.  No bowel movement for the past 2 days. -Add MiraLAX -Continue with Protonix. -Continue Toradol. -Continue with Zofran as needed. -No need for C. difficile or GI pathogen-discontinue the precautions.  Elevated LFTs.  Liver enzymes and T bili has been normalized, history of HCV. -Continue to monitor  Leukocytosis.  Seems chronic and improving.  all cell lines decreased might be some dilutional effect with IV fluid. No other obvious source of infection.  Tobacco abuse -Nicotine patch  Objective: Vitals:   04/17/20 0004 04/17/20 0412 04/17/20 0816 04/17/20 1438  BP: 113/76 (!) 140/94 120/82 (!) 130/94  Pulse: (!) 59 (!) 59 (!) 56 70  Resp: 18 16 18 18   Temp: 98.2 F (36.8 C) 98.5 F (36.9 C) 98.3 F (36.8 C) 98.4 F (36.9 C)  TempSrc: Oral Oral Oral Oral  SpO2:  100% 100% 100% 100%  Weight:      Height:        Intake/Output Summary (Last 24 hours) at 04/17/2020 1545 Last data filed at 04/17/2020 0700 Gross per 24 hour  Intake 2082.53 ml  Output 0 ml  Net 2082.53 ml   Filed Weights   04/15/20 0443 04/15/20 2211  Weight: 108.9 kg 87.2 kg     Examination:  General.  Agitated and aggressive gentleman, in no acute distress. Pulmonary.  Lungs clear bilaterally, normal respiratory effort. CV.  Regular rate and rhythm, no JVD, rub or murmur. Abdomen.  Soft, nontender, nondistended, BS positive. CNS.  Alert and oriented x3.  No focal neurologic deficit. Extremities.  No edema, no cyanosis, pulses intact and symmetrical. Psychiatry.  Judgment and insight appears normal.  DVT prophylaxis: Lovenox Code Status: Full Family Communication: Discussed with patient and the police officer present in room. Disposition Plan:  Status is: Inpatient  Remains inpatient appropriate because:Inpatient level of care appropriate due to severity of illness   Dispo: The patient is from: [place]              Anticipated d/c is to: [place]              Anticipated d/c date is: 1 day              Patient currently is not medically stable to d/c.   Consultants:   Gastroenterology  Procedures:  Antimicrobials:   Data Reviewed: I have personally reviewed following labs and imaging studies  CBC: Recent Labs  Lab 04/15/20 0509 04/16/20 0319 04/17/20 0442  WBC 16.9* 12.8* 10.8*  HGB 14.7 12.9* 11.6*  HCT 41.6 37.7* 35.1*  MCV 87.9 91.5 94.6  PLT 235 193 628   Basic Metabolic Panel: Recent Labs  Lab 04/15/20 0509 04/16/20 0319 04/17/20 0442  NA 136 141 139  K 4.4 3.5 3.9  CL 98 105 105  CO2 22 25 26   GLUCOSE 126* 97 101*  BUN 21* 15 14  CREATININE 1.24 0.98 1.08  CALCIUM 10.1 8.5* 8.8*   GFR: Estimated Creatinine Clearance: 109.9 mL/min (by C-G formula based on SCr of 1.08 mg/dL). Liver Function Tests: Recent Labs  Lab 04/15/20 0509 04/16/20 0319 04/17/20 0442  AST 42* 41 41  ALT 54* 41 38  ALKPHOS 65 52 44  BILITOT 1.3* 1.3* 1.0  PROT 9.4* 7.5 6.7  ALBUMIN 4.7 3.9 3.5   Recent Labs  Lab 04/15/20 0509  LIPASE 19   No results for input(s): AMMONIA in the last 168 hours. Coagulation Profile: No results for  input(s): INR, PROTIME in the last 168 hours. Cardiac Enzymes: No results for input(s): CKTOTAL, CKMB, CKMBINDEX, TROPONINI in the last 168 hours. BNP (last 3 results) No results for input(s): PROBNP in the last 8760 hours. HbA1C: No results for input(s): HGBA1C in the last 72 hours. CBG: No results for input(s): GLUCAP in the last 168 hours. Lipid Profile: No results for input(s): CHOL, HDL, LDLCALC, TRIG, CHOLHDL, LDLDIRECT in the last 72 hours. Thyroid Function Tests: No results for input(s): TSH, T4TOTAL, FREET4, T3FREE, THYROIDAB in the last 72 hours. Anemia Panel: No results for input(s): VITAMINB12, FOLATE, FERRITIN, TIBC, IRON, RETICCTPCT in the last 72 hours. Sepsis Labs: No results for input(s): PROCALCITON, LATICACIDVEN in the last 168 hours.  Recent Results (from the past 240 hour(s))  SARS CORONAVIRUS 2 (TAT 6-24 HRS) Nasopharyngeal Nasopharyngeal Swab     Status: None   Collection Time: 04/15/20 11:01 AM  Specimen: Nasopharyngeal Swab  Result Value Ref Range Status   SARS Coronavirus 2 NEGATIVE NEGATIVE Final    Comment: (NOTE) SARS-CoV-2 target nucleic acids are NOT DETECTED.  The SARS-CoV-2 RNA is generally detectable in upper and lower respiratory specimens during the acute phase of infection. Negative results do not preclude SARS-CoV-2 infection, do not rule out co-infections with other pathogens, and should not be used as the sole basis for treatment or other patient management decisions. Negative results must be combined with clinical observations, patient history, and epidemiological information. The expected result is Negative.  Fact Sheet for Patients: SugarRoll.be  Fact Sheet for Healthcare Providers: https://www.woods-mathews.com/  This test is not yet approved or cleared by the [place] FDA and  has been authorized for detection and/or diagnosis of SARS-CoV-2 by FDA under an Emergency Use Authorization  (EUA). This EUA will remain  in effect (meaning this test can be used) for the duration of the COVID-19 declaration under Se ction 564(b)(1) of the Act, 21 U.S.C. section 360bbb-3(b)(1), unless the authorization is terminated or revoked sooner.  Performed at [facility], [place] [address] [place], [place] [address]      Radiology Studies: DG Chest 2 View  Result Date: 04/16/2020 CLINICAL DATA:  Chest pain EXAM: CHEST - 2 VIEW COMPARISON:  03/25/2011 FINDINGS: The heart size and mediastinal contours are within normal limits. Both lungs are clear. The visualized skeletal structures are unremarkable. IMPRESSION: No active cardiopulmonary disease. Electronically Signed   By: Franchot Gallo M.D.   On: 04/16/2020 11:17    Scheduled Meds: . enoxaparin (LOVENOX) injection  40 mg Subcutaneous Q24H  . feeding supplement  1 Container Oral TID BM  . metoCLOPramide  5 mg Oral TID AC  . nicotine  21 mg Transdermal Daily  . polyethylene glycol  17 g Oral Daily   Continuous Infusions: . famotidine (PEPCID) IV 20 mg (04/17/20 0813)     LOS: 1 day   Time spent: 30 minutes.  Lorella Nimrod, MD [facility]  If 7PM-7AM, please contact night-coverage [email or web address]  04/17/2020, 3:45 PM   This record has been created using Systems analyst. Errors have been sought and corrected,but may not always be located. Such creation errors do not reflect on the standard of care.

## 2020-04-17 NOTE — Progress Notes (Signed)
Lucilla Lame, MD [facility]   [address] [place], [place] [address] Phone: [phone] Fax : [phone]   Subjective: Patient seen today with the patient having significant anxiety due to the prospects of being sent back to jail and being discharged from the hospital.  He was able to eat approximately half of his food but he states that he has not had any further diarrhea or vomiting.  He does report some nausea.  The patient states he will be incarcerated for the next 20 months and is concerned that he may not be able to get pain medication and the care he needs.  He denies being treated for his hepatitis C in the past and he also states that he has not used any alcohol or marijuana since being incarcerated for the last few weeks.   Objective: Vital signs in last 24 hours: Vitals:   04/16/20 2025 04/17/20 0004 04/17/20 0412 04/17/20 0816  BP: 116/82 113/76 (!) 140/94 120/82  Pulse: 61 (!) 59 (!) 59 (!) 56  Resp: 18 18 16 18   Temp: 98.4 F (36.9 C) 98.2 F (36.8 C) 98.5 F (36.9 C) 98.3 F (36.8 C)  TempSrc: Oral Oral Oral Oral  SpO2: 100% 100% 100% 100%  Weight:      Height:       Weight change:   Intake/Output Summary (Last 24 hours) at 04/17/2020 1355 Last data filed at 04/17/2020 0700 Gross per 24 hour  Intake 2082.53 ml  Output 0 ml  Net 2082.53 ml     Exam: Heart:: Regular rate and rhythm, S1S2 present or without murmur or extra heart sounds Lungs: normal and clear to auscultation and percussion Abdomen: Mild epigastric tenderness without rebound or guarding   Lab Results: @LABTEST2 @ Micro Results: Recent Results (from the past 240 hour(s))  SARS CORONAVIRUS 2 (TAT 6-24 HRS) Nasopharyngeal Nasopharyngeal Swab     Status: None   Collection Time: 04/15/20 11:01 AM   Specimen: Nasopharyngeal Swab  Result Value Ref Range Status   SARS Coronavirus 2 NEGATIVE NEGATIVE Final    Comment: (NOTE) SARS-CoV-2 target nucleic acids are NOT DETECTED.  The  SARS-CoV-2 RNA is generally detectable in upper and lower respiratory specimens during the acute phase of infection. Negative results do not preclude SARS-CoV-2 infection, do not rule out co-infections with other pathogens, and should not be used as the sole basis for treatment or other patient management decisions. Negative results must be combined with clinical observations, patient history, and epidemiological information. The expected result is Negative.  Fact Sheet for Patients: SugarRoll.be  Fact Sheet for Healthcare Providers: https://www.woods-mathews.com/  This test is not yet approved or cleared by the [place] FDA and  has been authorized for detection and/or diagnosis of SARS-CoV-2 by FDA under an Emergency Use Authorization (EUA). This EUA will remain  in effect (meaning this test can be used) for the duration of the COVID-19 declaration under Se ction 564(b)(1) of the Act, 21 U.S.C. section 360bbb-3(b)(1), unless the authorization is terminated or revoked sooner.  Performed at [facility], [place] [address] [place], [place] [address]    Studies/Results: DG Chest 2 View  Result Date: 04/16/2020 CLINICAL DATA:  Chest pain EXAM: CHEST - 2 VIEW COMPARISON:  03/25/2011 FINDINGS: The heart size and mediastinal contours are within normal limits. Both lungs are clear. The visualized skeletal structures are unremarkable. IMPRESSION: No active cardiopulmonary disease. Electronically Signed   By: Franchot Gallo M.D.   On: 04/16/2020 11:17   Medications:  I have reviewed the patient's current medications. Scheduled Meds: . enoxaparin (LOVENOX) injection  40 mg Subcutaneous Q24H  . feeding supplement  1 Container Oral TID BM  . metoCLOPramide  5 mg Oral TID AC  . nicotine  21 mg Transdermal Daily  . polyethylene glycol  17 g Oral Daily   Continuous Infusions: . famotidine (PEPCID) IV 20 mg (04/17/20 0813)   PRN  Meds:.dicyclomine, ketorolac, methocarbamol, ondansetron (ZOFRAN) IV, ondansetron   Assessment: Principal Problem:   Abdominal pain Active Problems:   Elevated LFTs   Gastritis   Tobacco abuse   Leukocytosis   Intractable nausea and vomiting   Non-intractable vomiting    Plan: The patient's bilirubin has come down and his liver enzymes are normal.  The patient's diarrhea has resolved and he is no longer vomiting.  I would recommend continued conservative therapy with advancing his diet as tolerated.  Nothing further to do from a GI point of view at this time   LOS: 1 day   Lewayne Bunting 04/17/2020, 1:55 PM Pager [phone] 7am-5pm  Check AMION for 5pm -7am coverage and on weekends

## 2020-04-18 DIAGNOSIS — R109 Unspecified abdominal pain: Secondary | ICD-10-CM | POA: Diagnosis not present

## 2020-04-18 DIAGNOSIS — D72829 Elevated white blood cell count, unspecified: Secondary | ICD-10-CM | POA: Diagnosis not present

## 2020-04-18 DIAGNOSIS — K2970 Gastritis, unspecified, without bleeding: Secondary | ICD-10-CM | POA: Diagnosis not present

## 2020-04-18 MED ORDER — DICYCLOMINE HCL 20 MG PO TABS: 20.0000 mg | ORAL_TABLET | Freq: Four times a day (QID) | ORAL | 0 refills | Status: AC | PRN

## 2020-04-18 MED ORDER — NICOTINE 21 MG/24HR TD PT24: 21.0000 mg | MEDICATED_PATCH | Freq: Every day | TRANSDERMAL | 0 refills | Status: AC

## 2020-04-18 MED ORDER — METOCLOPRAMIDE HCL 5 MG PO TABS: 5.0000 mg | ORAL_TABLET | Freq: Three times a day (TID) | ORAL | 0 refills | Status: AC

## 2020-04-18 MED ORDER — HYDROMORPHONE HCL 1 MG/ML IJ SOLN
1.0000 mg | Freq: Once | INTRAMUSCULAR | Status: AC
  Administered 2020-04-18: 1 mg via INTRAVENOUS
  Filled 2020-04-18: qty 1

## 2020-04-18 NOTE — Progress Notes (Signed)
Patient called out for something for pain right after his new IV insertion. Tried to speak with patient in his room regarding what was available and to ascertain what he wants- patient agitated and upset about my giving him his options and even asking the question. Tried to administer Toradol and Zofran, but patient refused stating "I'm letting you give me anything". Charge RN, Teacher, music, came in and took over patient.

## 2020-04-18 NOTE — Progress Notes (Signed)
Informed on call hospitalist, Sharion Settler, patient requested to have something for pain besides what is available to him. Patient claims he can't keep anything down and refuses to take any PO meds. Also, claims Toradol doesn't work for him, so he doesn't want it. Patient verbally aggressive and abusive with frequent interruptions during my attempts to answer his questions. Patient also demanded to see or talk with "the doctor".  I informed Hospitalist of this.

## 2020-04-18 NOTE — Discharge Summary (Signed)
Physician Discharge Summary  Albert Peters YIF:[id number] DOB: 1985/04/26 DOA: 04/15/2020  PCP: Patient, No Pcp Per  Admit date: 04/15/2020 Discharge date: 04/18/2020  Admitted From: Jail Disposition: Jail   Recommendations for Outpatient Follow-up:  1. Follow up with PCP in 1-2 weeks 2. Please obtain BMP/CBC in one week 3. Please follow up on the following pending results:None  Home Health:No Equipment/Devices: None Discharge Condition: Stable but agitated CODE STATUS: Full  diet recommendation: Heart Healthy / Carb Modified / Regular / Dysphagia   Brief/Interim Summary: Albert Golden Hortonis a 34 y.o.malewith medical history significant ofhepatitis C, pancreatitis, gastritis, GERD, tobacco abuse, marijuana abuse, who presents with abdominal pain, nausea, vomiting, diarrhea.  Patient states that his symptoms have been going for2 days, including nausea, vomiting,diarrhea andabdominal pain. He has intractable nausea andvomiting with nonbilious nonbloody vomiting. He has upper abdominal pain, which is constant, sharp, 9 out of 10 severity, nonradiating. Patient also reports several episodes of diarrhea which resolved spontaneously, no bowel movement while in the hospital. Labs positive for mildly elevated T bili at 1.3, CT abdomen with wall thickening of the colon involving the ascending, transverse and proximal descending suggestive of nonspecific infectious or inflammatory colitis. Patient was treated with supportive care, continue to ask for more pain medications.  He just want Dilaudid and nothing else stating that nothing else works for him.  No clinical reason for that severe pain.  Most likely a drug-seeking behavior.  Lab abnormalities resolved.  CT abdomen was repeated for his persistent complaint of pain and came back normal, even prior  colitis findings has been resolved.  Patient continue to threat me and nursing staff about more and more pain medication and become very  aggressive when we stopped the Dilaudid a day before.  We did give him 1 more dose of Dilaudid this morning before discharge just to avoid the disruption.  A lot of aggressive and behavioral issues which needs to be addressed with behavioral health.  GI was also consulted and they also do not think that he needs any further evaluation.  Most likely a simple gastroenteritis which he was exaggerated with his behavior.  CT abdomen also shows stable liver lesions, which can be assessed for slowly filling hemangioma.  No further work-up is required, similar findings on repeat CT abdomen.  Patient do have mild leukocytosis, which seems chronic and did improve during current hospitalization.  Can be worked up as an outpatient if needed.  Discharge Diagnoses:  Principal Problem:   Abdominal pain Active Problems:   Elevated LFTs   Gastritis   Tobacco abuse   Leukocytosis   Intractable nausea and vomiting   Non-intractable vomiting   Discharge Instructions  Discharge Instructions    Diet - low sodium heart healthy   Complete by: As directed    Discharge instructions   Complete by: As directed    It was pleasure taking care of you. Continue taking Protonix for the next few weeks. You can take Reglan or Zofran as needed for nausea. Advance your diet slowly and keep yourself hydrated. Avoid marijuana as that can precipitate your symptoms.   Increase activity slowly   Complete by: As directed      Allergies as of 04/18/2020      Reactions   Penicillin G Rash   Shellfish Allergy Rash   Sulfa Antibiotics Rash      Medication List    STOP taking these medications   ciprofloxacin 500 MG tablet Commonly known as: Cipro  famotidine 20 MG tablet Commonly known as: PEPCID   ketorolac 10 MG tablet Commonly known as: TORADOL   metroNIDAZOLE 500 MG tablet Commonly known as: Flagyl   naproxen 500 MG tablet Commonly known as: Naprosyn   omeprazole 20 MG capsule Commonly known as:  PRILOSEC   ondansetron 4 MG tablet Commonly known as: ZOFRAN   ondansetron 8 MG tablet Commonly known as: Zofran   Oxycodone HCl 10 MG Tabs   potassium chloride 10 MEQ tablet Commonly known as: KLOR-CON   prochlorperazine 10 MG tablet Commonly known as: Compazine   prochlorperazine 25 MG suppository Commonly known as: Compazine   promethazine 25 MG tablet Commonly known as: PHENERGAN   ranitidine 150 MG tablet Commonly known as: ZANTAC     TAKE these medications   dicyclomine 20 MG tablet Commonly known as: BENTYL Take 1 tablet (20 mg total) by mouth every 6 (six) hours as needed for spasms (abdominal pain/cramping). What changed:   when to take this  reasons to take this   metoCLOPramide 5 MG tablet Commonly known as: Reglan Take 1 tablet (5 mg total) by mouth 3 (three) times daily.   nicotine 21 mg/24hr patch Commonly known as: NICODERM CQ - dosed in mg/24 hours Place 1 patch (21 mg total) onto the skin daily.   ondansetron 4 MG disintegrating tablet Commonly known as: Zofran ODT Take 1 tablet (4 mg total) by mouth every 8 (eight) hours as needed for nausea or vomiting.   pantoprazole 40 MG tablet Commonly known as: Protonix Take 1 tablet (40 mg total) by mouth daily.       Follow-up Information    [facility] EMERGENCY DEPARTMENT.   Specialty: Emergency Medicine Why: As needed Contact information: [place] [id number] [place] [id number] [phone]             Allergies  Allergen Reactions  . Penicillin G Rash  . Shellfish Allergy Rash  . Sulfa Antibiotics Rash    Consultations:  GI  Procedures/Studies: CT ABDOMEN PELVIS WO CONTRAST  Result Date: 04/17/2020 CLINICAL DATA:  Abdominal pain with nausea and vomiting over the last few hours. EXAM: CT ABDOMEN AND PELVIS WITHOUT CONTRAST TECHNIQUE: Multidetector CT imaging of the abdomen and pelvis was performed following the standard  protocol without IV contrast. COMPARISON:  04/15/2020. FINDINGS: Lower chest: Clear lung bases.  Heart normal in size. Hepatobiliary: Subtle low-attenuation areas in the left lobe, 1 in segment 3 the other 4 B, the segment 3 lesion likely focal fat and the segment 4 B lesion consistent with a hemangioma as noted on the prior CT. These lesions are stable. No other liver abnormality. Normal gallbladder. No bile duct dilation. Pancreas: Unremarkable. No pancreatic ductal dilatation or surrounding inflammatory changes. Spleen: Normal in size without focal abnormality. Adrenals/Urinary Tract: Adrenal glands are unremarkable. Kidneys are normal, without renal calculi, focal lesion, or hydronephrosis. Bladder is unremarkable. Stomach/Bowel: Normal stomach. Small bowel and colon are normal in caliber. No wall thickening. No inflammation. Normal sized appendix partly visualized. No evidence of appendicitis. Vascular/Lymphatic: No significant vascular findings are present. No enlarged abdominal or pelvic lymph nodes. Reproductive: Unremarkable. Other: No abdominal wall hernia or abnormality. No abdominopelvic ascites. Musculoskeletal: No acute or significant osseous findings. IMPRESSION: 1. No acute findings. 2. Portions of a normal appendix visualized. No evidence of appendicitis. 3. Stable liver M angioma. Small amount of focal fat adjacent to the falciform ligament. No other abnormalities. Electronically Signed   By: Lajean Manes  M.D.   On: 04/17/2020 17:39   DG Chest 2 View  Result Date: 04/16/2020 CLINICAL DATA:  Chest pain EXAM: CHEST - 2 VIEW COMPARISON:  03/25/2011 FINDINGS: The heart size and mediastinal contours are within normal limits. Both lungs are clear. The visualized skeletal structures are unremarkable. IMPRESSION: No active cardiopulmonary disease. Electronically Signed   By: Franchot Gallo M.D.   On: 04/16/2020 11:17   CT ABDOMEN PELVIS W CONTRAST  Result Date: 04/15/2020 CLINICAL DATA:  Abdominal  pain, nausea, vomiting, history of chronic pancreatitis and hepatitis EXAM: CT ABDOMEN AND PELVIS WITH CONTRAST TECHNIQUE: Multidetector CT imaging of the abdomen and pelvis was performed using the standard protocol following bolus administration of intravenous contrast. CONTRAST:  110mL OMNIPAQUE IOHEXOL 300 MG/ML  SOLN COMPARISON:  06/11/2016, 04/28/2014 FINDINGS: Lower chest: No acute abnormality. Hepatobiliary: Redemonstrated unchanged low-attenuation lesion of the central right lobe of the liver, consistent with a cyst or slowly filling hemangioma (series 2, image 18). Unchanged Hyperenhancing subcapsular lesion of the anterior right lobe of the liver, again likely a flash filling hemangioma or perhaps a focal nodular hyperplasia (series 2, image 25). Focal fatty deposition about the falciform ligament. No gallstones, gallbladder wall thickening, or biliary dilatation. Pancreas: Unremarkable. No pancreatic ductal dilatation or surrounding inflammatory changes. Spleen: Normal in size without significant abnormality. Adrenals/Urinary Tract: Adrenal glands are unremarkable. Kidneys are normal, without renal calculi, solid lesion, or hydronephrosis. Bladder is unremarkable. Stomach/Bowel: Stomach is within normal limits. Appendix appears normal. There is wall thickening of the decompressed colon, involving the ascending, transverse, and proximal descending colon. Vascular/Lymphatic: No significant vascular findings are present. No enlarged abdominal or pelvic lymph nodes. Reproductive: No mass or other significant abnormality. Other: No abdominal wall hernia or abnormality. No abdominopelvic ascites. Musculoskeletal: No acute or significant osseous findings. IMPRESSION: 1. Wall thickening of the decompressed colon, involving the ascending, transverse, and proximal descending colon, suggestive of nonspecific infectious or inflammatory colitis. 2. Redemonstrated low-attenuation lesion of the central right lobe of the  liver, consistent with a cyst or slowly filling hemangioma. Unchanged hyperenhancing subcapsular lesion of the anterior right lobe of the liver, again likely a flash filling hemangioma or perhaps a focal nodular hyperplasia. No further follow-up or characterization is required for these stable, benign lesions. Electronically Signed   By: Eddie Candle M.D.   On: 04/15/2020 12:08     Subjective: Patient continued to demand more pain medications and intentionally trying to vomit out.  No medical reason for this pain or symptoms. He was just asking for Dilaudid and nothing else.  Discharge Exam: Vitals:   04/18/20 0408 04/18/20 0727  BP: (!) 143/94 (!) 133/108  Pulse: 82 100  Resp: 20 20  Temp: 98.7 F (37.1 C) 98.3 F (36.8 C)  SpO2: 100% 100%   Vitals:   04/17/20 1655 04/17/20 2135 04/18/20 0408 04/18/20 0727  BP: (!) 158/104 (!) 121/96 (!) 143/94 (!) 133/108  Pulse: 86 64 82 100  Resp: 18 20 20 20   Temp: 98.4 F (36.9 C) 98.9 F (37.2 C) 98.7 F (37.1 C) 98.3 F (36.8 C)  TempSrc: Oral Oral Oral Oral  SpO2: 100% 100% 100% 100%  Weight:      Height:        General: Pt is alert, awake, not in acute distress Cardiovascular: RRR, S1/S2 +, no rubs, no gallops Respiratory: CTA bilaterally, no wheezing, no rhonchi Abdominal: Soft, NT, ND, bowel sounds + Extremities: no edema, no cyanosis   The results of significant diagnostics from  this hospitalization (including imaging, microbiology, ancillary and laboratory) are listed below for reference.    Microbiology: Recent Results (from the past 240 hour(s))  SARS CORONAVIRUS 2 (TAT 6-24 HRS) Nasopharyngeal Nasopharyngeal Swab     Status: None   Collection Time: 04/15/20 11:01 AM   Specimen: Nasopharyngeal Swab  Result Value Ref Range Status   SARS Coronavirus 2 NEGATIVE NEGATIVE Final    Comment: (NOTE) SARS-CoV-2 target nucleic acids are NOT DETECTED.  The SARS-CoV-2 RNA is generally detectable in upper and  lower respiratory specimens during the acute phase of infection. Negative results do not preclude SARS-CoV-2 infection, do not rule out co-infections with other pathogens, and should not be used as the sole basis for treatment or other patient management decisions. Negative results must be combined with clinical observations, patient history, and epidemiological information. The expected result is Negative.  Fact Sheet for Patients: SugarRoll.be  Fact Sheet for Healthcare Providers: https://www.woods-mathews.com/  This test is not yet approved or cleared by the [place] FDA and  has been authorized for detection and/or diagnosis of SARS-CoV-2 by FDA under an Emergency Use Authorization (EUA). This EUA will remain  in effect (meaning this test can be used) for the duration of the COVID-19 declaration under Se ction 564(b)(1) of the Act, 21 U.S.C. section 360bbb-3(b)(1), unless the authorization is terminated or revoked sooner.  Performed at [facility], [place] [address] [place], [place] [address]      Labs: BNP (last 3 results) No results for input(s): BNP in the last 8760 hours. Basic Metabolic Panel: Recent Labs  Lab 04/15/20 0509 04/16/20 0319 04/17/20 0442  NA 136 141 139  K 4.4 3.5 3.9  CL 98 105 105  CO2 22 25 26   GLUCOSE 126* 97 101*  BUN 21* 15 14  CREATININE 1.24 0.98 1.08  CALCIUM 10.1 8.5* 8.8*   Liver Function Tests: Recent Labs  Lab 04/15/20 0509 04/16/20 0319 04/17/20 0442  AST 42* 41 41  ALT 54* 41 38  ALKPHOS 65 52 44  BILITOT 1.3* 1.3* 1.0  PROT 9.4* 7.5 6.7  ALBUMIN 4.7 3.9 3.5   Recent Labs  Lab 04/15/20 0509  LIPASE 19   No results for input(s): AMMONIA in the last 168 hours. CBC: Recent Labs  Lab 04/15/20 0509 04/16/20 0319 04/17/20 0442  WBC 16.9* 12.8* 10.8*  HGB 14.7 12.9* 11.6*  HCT 41.6 37.7* 35.1*  MCV 87.9 91.5 94.6  PLT 235 193 162   Cardiac Enzymes: No results  for input(s): CKTOTAL, CKMB, CKMBINDEX, TROPONINI in the last 168 hours. BNP: Invalid input(s): POCBNP CBG: No results for input(s): GLUCAP in the last 168 hours. D-Dimer No results for input(s): DDIMER in the last 72 hours. Hgb A1c No results for input(s): HGBA1C in the last 72 hours. Lipid Profile No results for input(s): CHOL, HDL, LDLCALC, TRIG, CHOLHDL, LDLDIRECT in the last 72 hours. Thyroid function studies No results for input(s): TSH, T4TOTAL, T3FREE, THYROIDAB in the last 72 hours.  Invalid input(s): FREET3 Anemia work up No results for input(s): VITAMINB12, FOLATE, FERRITIN, TIBC, IRON, RETICCTPCT in the last 72 hours. Urinalysis    Component Value Date/Time   COLORURINE YELLOW 12/06/2014 1540   APPEARANCEUR CLEAR 12/06/2014 1540   APPEARANCEUR Cloudy 03/30/2014 2119   LABSPEC >1.046 (H) 12/06/2014 1540   LABSPEC 1.023 03/30/2014 2119   PHURINE 8.0 12/06/2014 1540   GLUCOSEU NEGATIVE 12/06/2014 1540   GLUCOSEU Negative 03/30/2014 2119   HGBUR NEGATIVE 12/06/2014 1540   BILIRUBINUR NEGATIVE  12/06/2014 1540   BILIRUBINUR Negative 03/30/2014 2119   KETONESUR NEGATIVE 12/06/2014 1540   PROTEINUR NEGATIVE 12/06/2014 1540   UROBILINOGEN 1.0 12/06/2014 1540   NITRITE NEGATIVE 12/06/2014 1540   LEUKOCYTESUR NEGATIVE 12/06/2014 1540   LEUKOCYTESUR Negative 03/30/2014 2119   Sepsis Labs Invalid input(s): PROCALCITONIN,  WBC,  LACTICIDVEN Microbiology Recent Results (from the past 240 hour(s))  SARS CORONAVIRUS 2 (TAT 6-24 HRS) Nasopharyngeal Nasopharyngeal Swab     Status: None   Collection Time: 04/15/20 11:01 AM   Specimen: Nasopharyngeal Swab  Result Value Ref Range Status   SARS Coronavirus 2 NEGATIVE NEGATIVE Final    Comment: (NOTE) SARS-CoV-2 target nucleic acids are NOT DETECTED.  The SARS-CoV-2 RNA is generally detectable in upper and lower respiratory specimens during the acute phase of infection. Negative results do not preclude SARS-CoV-2 infection, do  not rule out co-infections with other pathogens, and should not be used as the sole basis for treatment or other patient management decisions. Negative results must be combined with clinical observations, patient history, and epidemiological information. The expected result is Negative.  Fact Sheet for Patients: SugarRoll.be  Fact Sheet for Healthcare Providers: https://www.woods-mathews.com/  This test is not yet approved or cleared by the [place] FDA and  has been authorized for detection and/or diagnosis of SARS-CoV-2 by FDA under an Emergency Use Authorization (EUA). This EUA will remain  in effect (meaning this test can be used) for the duration of the COVID-19 declaration under Se ction 564(b)(1) of the Act, 21 U.S.C. section 360bbb-3(b)(1), unless the authorization is terminated or revoked sooner.  Performed at [facility], [place] [address], [place], [place] [address]     Time coordinating discharge: Over 30 minutes  SIGNED:  Lorella Nimrod, MD  [facility] 04/18/2020, 9:10 AM  If 7PM-7AM, please contact night-coverage [email or web address]  This record has been created using Systems analyst. Errors have been sought and corrected,but may not always be located. Such creation errors do not reflect on the standard of care.

## 2020-04-18 NOTE — Progress Notes (Signed)
Patient called out stating his IV had come out. Entered consult for new IV and dressed old IV site.

## 2020-04-18 NOTE — Progress Notes (Signed)
Informed on call provider, Sharion Settler, NP of patient's request to be seen/evaluated by provider for continued unrelieved pain.  Provider verbalized  inability  to see patient at this time.

## 2020-04-18 NOTE — Progress Notes (Signed)
Patient continues to call out, requesting pain medication and anti emetic, unable to administer any medications at this time due to order parameters being too soon.  RN informed nurse supervisor of inability to obtain orders to cover reported issue.

## 2020-04-18 NOTE — Treatment Plan (Signed)
This Probation officer attempted to educate patient and officer about d/c instructions. Pt states he cannot keep anything down and we should not be discharging him. Pt also lied to this writer earlier when I gave him his ordered IV pain medication, dilaudid 1mg , stating his IV was leaking. I watched him receive his IV pain medication and his IV was not leaking. As I was trying to educate him, patient started to yell and scream at me, cussing, thrashing in bed stating he did not want to leave and we couldn't give "a fuck less" about him. Redirection was unsuccessful as this Probation officer was in his room several times this am as I was trying to provide care. Pt wheelchair ordered, discharge papers given to officer at bedside.

## 2020-04-18 NOTE — Progress Notes (Signed)
Patient resting comfortably in bed and appears to be asleep.

## 2020-04-18 NOTE — Progress Notes (Signed)
Patient in the hall insisting he should not be discharged.  Reviewed with him his labs, vital signs, imaging and assessments.  Reviewed doctor's notes and discharge summary indicating patient was stable for discharge.  Reviewed with patient's RN Humberto Leep patient's assessment and behaviors today and over the course of his hospitalization.  Patient continues to insist it's not right to discharge him now.  As there is no documented medical reason for him to stay, patient was discharged, and escorted out by law enforcement.  Dola Argyle, RN

## 2020-04-18 NOTE — Progress Notes (Signed)
Informed on call hospitalist patient calling out again to speak to "the doctor" and wanting more pain meds. Asked hospitalist to give him a call. Patient again verbally aggressive and abusive.
